# Patient Record
Sex: Male | Born: 1935 | Race: White | Hispanic: No | Marital: Married | Smoking: Former smoker
Health system: Southern US, Community
[De-identification: ages and names within clinical notes are randomized; demographics above are authoritative.]

## PROBLEM LIST (undated history)

## (undated) DIAGNOSIS — M1990 Unspecified osteoarthritis, unspecified site: Secondary | ICD-10-CM

## (undated) DIAGNOSIS — E0590 Thyrotoxicosis, unspecified without thyrotoxic crisis or storm: Secondary | ICD-10-CM

## (undated) DIAGNOSIS — G629 Polyneuropathy, unspecified: Secondary | ICD-10-CM

## (undated) HISTORY — DX: Thyrotoxicosis, unspecified without thyrotoxic crisis or storm: E05.90

## (undated) HISTORY — DX: Polyneuropathy, unspecified: G62.9

## (undated) HISTORY — PX: HEMORRHOID SURGERY: SHX153

## (undated) HISTORY — DX: Hypercalcemia: E83.52

## (undated) HISTORY — DX: Unspecified osteoarthritis, unspecified site: M19.90

---

## 2010-03-16 ENCOUNTER — Other Ambulatory Visit (HOSPITAL_COMMUNITY): Payer: Self-pay | Admitting: Endocrinology

## 2010-03-16 DIAGNOSIS — E0590 Thyrotoxicosis, unspecified without thyrotoxic crisis or storm: Secondary | ICD-10-CM

## 2010-03-29 ENCOUNTER — Ambulatory Visit (HOSPITAL_COMMUNITY): Payer: Self-pay

## 2010-03-29 ENCOUNTER — Encounter (HOSPITAL_COMMUNITY)
Admission: RE | Admit: 2010-03-29 | Discharge: 2010-03-29 | Disposition: A | Discharge status: Home / Self Care | Payer: Medicare Other | Source: Ambulatory Visit | Attending: Endocrinology | Admitting: Endocrinology

## 2010-03-29 DIAGNOSIS — E213 Hyperparathyroidism, unspecified: Secondary | ICD-10-CM | POA: Insufficient documentation

## 2010-03-29 DIAGNOSIS — E0590 Thyrotoxicosis, unspecified without thyrotoxic crisis or storm: Secondary | ICD-10-CM

## 2010-03-29 DIAGNOSIS — R946 Abnormal results of thyroid function studies: Secondary | ICD-10-CM | POA: Insufficient documentation

## 2010-03-29 MED ORDER — TECHNETIUM TC 99M SESTAMIBI - CARDIOLITE
25.0000 | Freq: Once | INTRAVENOUS | Status: AC | PRN
  Administered 2010-03-29: 25 via INTRAVENOUS

## 2010-07-14 ENCOUNTER — Encounter (HOSPITAL_COMMUNITY): Payer: Medicare Other

## 2010-07-26 ENCOUNTER — Encounter (INDEPENDENT_AMBULATORY_CARE_PROVIDER_SITE_OTHER): Payer: Medicare Other | Admitting: Surgery

## 2010-08-16 ENCOUNTER — Encounter (INDEPENDENT_AMBULATORY_CARE_PROVIDER_SITE_OTHER): Payer: Self-pay | Admitting: Surgery

## 2010-08-17 ENCOUNTER — Encounter (INDEPENDENT_AMBULATORY_CARE_PROVIDER_SITE_OTHER): Payer: Self-pay | Admitting: Surgery

## 2010-08-17 ENCOUNTER — Ambulatory Visit (INDEPENDENT_AMBULATORY_CARE_PROVIDER_SITE_OTHER): Payer: Medicare Other | Admitting: Surgery

## 2010-08-17 VITALS — BP 118/72 | HR 64 | Temp 96.6°F | Ht 71.0 in | Wt 188.6 lb

## 2010-08-17 DIAGNOSIS — E210 Primary hyperparathyroidism: Secondary | ICD-10-CM | POA: Insufficient documentation

## 2010-08-17 NOTE — Progress Notes (Signed)
Chief Complaint  Patient presents with  . Parathyroid Adenoma    recurrent hypercalcemia, previous parathyroidectomy    HISTORY: Patient is a 77 year old white male who presents with recurrent primary hyperparathyroidism. Patient apparently underwent parathyroidectomy at [facility] in [place] approximately 6 years ago. Following the procedure, he had complications including loss of voice for approximately 3 months. Calcium levels never returned to normal. He has continued to experience hypercalcemia. Patient was evaluated by his primary physician here in [place], and referred to an endocrinologist. Calcium levels are elevated at 11.2. PTH level is normal at 44. Patient now presents for consideration for neck exploration. Patient did undergo a nuclear medicine parathyroid scan in March 2012. This shows activity in the right thyroid bed consistent with parathyroid adenoma.  Patient denies any complications of hypercalcemia. He denies nephrolithiasis. He denies fatigue.   Past Medical History  Diagnosis Date  . Peripheral neuropathy   . Hypercalcemia   . Hyperthyroidism   . Hemorrhoids      Current outpatient prescriptions:aspirin 81 MG tablet, Take 81 mg by mouth daily. Patient also taking a medication for cholesterol, but does not remember name or dosage. Will provide at next visit. , Disp: , Rfl: ;  gabapentin (NEURONTIN) 300 MG capsule, Take 300 mg by mouth 3 (three) times daily.  , Disp: , Rfl:    No Known Allergies   History reviewed. No pertinent family history.   History  Substance Use Topics  . Smoking status: Former Games developer  . Smokeless tobacco: Not on file  . Alcohol Use: No     PERTINENT POSITIVES FROM REVIEW OF SYSTEMS: As noted, patient denies fatigue. He denies nephrolithiasis. He denies any recent fractures.   EXAM: Filed Vitals:   08/17/10 1004  BP: 118/72  Pulse: 64  Temp: 96.6 F (35.9 C)   HEENT shows him to be normocephalic. Sclerae  clear. Pupils reactive. Condition fair. Mucous membranes moist. Voice is normal with a slight ramps. He does experience some minor voice fatigue. Neck shows well-healed surgical wound to the right of midline measuring approximately 2 cm in length. Palpation reveals no mass. There is no tenderness. There are no thyroid nodules. There are no supraclavicular masses. Chest is clear to auscultation without rales rhonchi or wheeze Cardiac exam shows regular rate and rhythm without significant murmur Extremities are nontender without edema. Neurologically the patient is alert and oriented. Neuro no focal deficits. There is no sign of tremor.   LABORATORY RESULTS: See E-Chart for most recent results   RADIOLOGY RESULTS: See E-Chart or I-Site for most recent results   IMPRESSION: Recurrent persistent primary hyperparathyroidism. Personal history of previous parathyroidectomy.   PLAN: The patient and I discussed his history and his prior surgical procedure. We discussed options for management. These options both surgical and nonsurgical.  Patient would like to consider surgical intervention in hopes of doing this problem. I am going to ask him to undergo a 24-hour urine collection for calcium. We will also obtain a ultrasound of the neck to evaluate his thyroid and possibly identify an enlarged parathyroid adenoma on the right.  Patient will return to discuss the results of these studies and make a final decision regarding surgery.   Velora Heckler, MD, FACS General & Endocrine Surgery [facility]

## 2010-08-18 ENCOUNTER — Other Ambulatory Visit (INDEPENDENT_AMBULATORY_CARE_PROVIDER_SITE_OTHER): Payer: Self-pay | Admitting: Surgery

## 2010-08-18 ENCOUNTER — Ambulatory Visit
Admission: RE | Admit: 2010-08-18 | Discharge: 2010-08-18 | Disposition: A | Discharge status: Home / Self Care | Payer: Medicare Other | Source: Ambulatory Visit | Attending: Surgery | Admitting: Surgery

## 2010-08-19 LAB — CALCIUM, URINE, 24 HOUR
Calcium, 24 hour urine: 453 mg/d — ABNORMAL HIGH (ref 100–250)
Calcium, Ur: 37 mg/dL

## 2010-08-31 ENCOUNTER — Telehealth (INDEPENDENT_AMBULATORY_CARE_PROVIDER_SITE_OTHER): Payer: Self-pay | Admitting: Surgery

## 2010-09-02 ENCOUNTER — Telehealth (INDEPENDENT_AMBULATORY_CARE_PROVIDER_SITE_OTHER): Payer: Self-pay

## 2010-09-02 NOTE — Telephone Encounter (Signed)
Left message for patient to call back and get first available follow up appointment to discuss thyroid results. He may call back and schedule the appointment with any staff member for a follow.

## 2010-09-05 ENCOUNTER — Telehealth (INDEPENDENT_AMBULATORY_CARE_PROVIDER_SITE_OTHER): Payer: Self-pay

## 2010-09-05 NOTE — Telephone Encounter (Signed)
done

## 2010-09-14 ENCOUNTER — Telehealth (INDEPENDENT_AMBULATORY_CARE_PROVIDER_SITE_OTHER): Payer: Self-pay | Admitting: Surgery

## 2010-09-14 DIAGNOSIS — E210 Primary hyperparathyroidism: Secondary | ICD-10-CM

## 2010-09-14 NOTE — Telephone Encounter (Signed)
Called results of 24h U Ca and ultrasound to patient.  Will schedule for minimally invasive parathyroidectomy.  Patient prefers out-pt.

## 2010-09-22 ENCOUNTER — Ambulatory Visit (INDEPENDENT_AMBULATORY_CARE_PROVIDER_SITE_OTHER): Payer: Medicare Other | Admitting: Surgery

## 2010-10-10 ENCOUNTER — Other Ambulatory Visit (INDEPENDENT_AMBULATORY_CARE_PROVIDER_SITE_OTHER): Payer: Self-pay | Admitting: Surgery

## 2010-10-10 ENCOUNTER — Other Ambulatory Visit (HOSPITAL_COMMUNITY): Payer: Medicare Other

## 2010-10-10 ENCOUNTER — Encounter (HOSPITAL_COMMUNITY): Payer: Medicare Other

## 2010-10-10 ENCOUNTER — Ambulatory Visit (HOSPITAL_COMMUNITY)
Admission: RE | Admit: 2010-10-10 | Discharge: 2010-10-10 | Disposition: A | Discharge status: Home / Self Care | Payer: Medicare Other | Source: Ambulatory Visit | Attending: Surgery | Admitting: Surgery

## 2010-10-10 DIAGNOSIS — Z01810 Encounter for preprocedural cardiovascular examination: Secondary | ICD-10-CM | POA: Insufficient documentation

## 2010-10-10 DIAGNOSIS — Z01818 Encounter for other preprocedural examination: Secondary | ICD-10-CM | POA: Insufficient documentation

## 2010-10-10 DIAGNOSIS — Z01812 Encounter for preprocedural laboratory examination: Secondary | ICD-10-CM | POA: Insufficient documentation

## 2010-10-10 LAB — URINALYSIS, ROUTINE W REFLEX MICROSCOPIC
Leukocytes, UA: NEGATIVE
Nitrite: NEGATIVE
Specific Gravity, Urine: 1.021 (ref 1.005–1.030)
Urobilinogen, UA: 0.2 mg/dL (ref 0.0–1.0)
pH: 6.5 (ref 5.0–8.0)

## 2010-10-10 LAB — DIFFERENTIAL
Basophils Absolute: 0 10*3/uL (ref 0.0–0.1)
Basophils Relative: 1 % (ref 0–1)
Lymphocytes Relative: 28 % (ref 12–46)
Monocytes Absolute: 0.6 10*3/uL (ref 0.1–1.0)
Neutro Abs: 3.7 10*3/uL (ref 1.7–7.7)
Neutrophils Relative %: 59 % (ref 43–77)

## 2010-10-10 LAB — BASIC METABOLIC PANEL
CO2: 29 mEq/L (ref 19–32)
Calcium: 11.7 mg/dL — ABNORMAL HIGH (ref 8.4–10.5)
Creatinine, Ser: 0.79 mg/dL (ref 0.50–1.35)
GFR calc non Af Amer: >60 mL/min (ref >60)
Sodium: 139 mEq/L (ref 135–145)

## 2010-10-10 LAB — CBC
HCT: 46.8 % (ref 39.0–52.0)
MCHC: 34 g/dL (ref 30.0–36.0)
Platelets: 320 10*3/uL (ref 150–400)
RDW: 12.3 % (ref 11.5–15.5)
WBC: 6.3 10*3/uL (ref 4.0–10.5)

## 2010-10-10 LAB — PROTIME-INR: INR: 0.97 (ref 0.00–1.49)

## 2010-10-10 LAB — SURGICAL PCR SCREEN
MRSA, PCR: NEGATIVE
Staphylococcus aureus: NEGATIVE

## 2010-10-10 NOTE — Progress Notes (Signed)
Quick Note:  These results are acceptable for scheduled surgery. TMG ______ 

## 2010-10-13 ENCOUNTER — Observation Stay (HOSPITAL_COMMUNITY)
Admission: AD | Admit: 2010-10-13 | Discharge: 2010-10-14 | Disposition: A | Discharge status: Home / Self Care | Payer: Medicare Other | Source: Ambulatory Visit | Attending: Surgery | Admitting: Surgery

## 2010-10-13 ENCOUNTER — Other Ambulatory Visit (INDEPENDENT_AMBULATORY_CARE_PROVIDER_SITE_OTHER): Payer: Self-pay | Admitting: Surgery

## 2010-10-13 DIAGNOSIS — E063 Autoimmune thyroiditis: Secondary | ICD-10-CM

## 2010-10-13 DIAGNOSIS — F17200 Nicotine dependence, unspecified, uncomplicated: Secondary | ICD-10-CM | POA: Insufficient documentation

## 2010-10-13 DIAGNOSIS — J45909 Unspecified asthma, uncomplicated: Secondary | ICD-10-CM | POA: Insufficient documentation

## 2010-10-13 DIAGNOSIS — K219 Gastro-esophageal reflux disease without esophagitis: Secondary | ICD-10-CM | POA: Insufficient documentation

## 2010-10-13 DIAGNOSIS — Z01812 Encounter for preprocedural laboratory examination: Secondary | ICD-10-CM | POA: Insufficient documentation

## 2010-10-13 DIAGNOSIS — E210 Primary hyperparathyroidism: Secondary | ICD-10-CM

## 2010-10-13 DIAGNOSIS — D351 Benign neoplasm of parathyroid gland: Secondary | ICD-10-CM | POA: Insufficient documentation

## 2010-10-13 HISTORY — PX: PARATHYROIDECTOMY: SHX19

## 2010-10-18 NOTE — Op Note (Signed)
[place]  Steve Thompson, Steve Thompson NO.:  [id number]  MEDICAL RECORD NO.:  [id number]  LOCATION:  1534                         FACILITY:  [facility]  PHYSICIAN:  Velora Heckler, MD      DATE OF BIRTH:  05/07/1931  DATE OF PROCEDURE:  10/13/2010                               OPERATIVE REPORT   PREOPERATIVE DIAGNOSIS:  Recurrent primary hyperparathyroidism.  POSTOPERATIVE DIAGNOSIS:  Recurrent primary hyperparathyroidism.  PROCEDURE: 1. Right superior parathyroidectomy. 2. Right thyroid lobectomy.  SURGEON:  Velora Heckler, MD, FACS  ANESTHESIA:  General per Dr. Lucille Passy.  ESTIMATED BLOOD LOSS:  Minimal.  PREPARATION:  ChloraPrep.  COMPLICATIONS:  None.  INDICATIONS:  The patient is a 77 year old white male who presents with recurrent primary hyperparathyroidism.  The patient had undergone parathyroidectomy at [facility] in [place] 6 years ago. Postoperatively, he experienced significant hoarseness and change in voice quality, which persisted for approximately 3 months.  Serum calcium levels never normalized and intact PTH levels apparently remained abnormal.  The patient was referred to my practice by his endocrinologist.  Nuclear medicine, parathyroid scanning showed a focus of activity posterior to the right thyroid lobe.  Ultrasound demonstrated a 9 mm nodular density posterior to the right thyroid lobe. The patient's previous exploration had also been on the right side of the neck.  The patient now comes to surgery for neck exploration and parathyroidectomy.  BODY OF REPORT:  Procedure was done in OR#1 at the [facility].  The patient was brought to the operating room, placed in supine position on the operating room table.  Following administration of general anesthesia, the patient was positioned and then prepped and draped in the usual strict aseptic fashion.  After ascertaining that an adequate level of anesthesia had  been achieved, a right inferior neck incision was made just cephalad to his previous incision.  The dissection was carried through subcutaneous tissues and platysma and hemostasis obtained with electrocautery.  Skin flaps were elevated circumferentially.  Previous scar tissue was divided with electrocautery.  Weitlaner retractor was placed for exposure.  Strap muscles were incised in the midline and reflected to the right exposing the right thyroid lobe.  There were dense adhesions to the capsule of the thyroid due to previous surgery.  Previous ligature clips were visible.  Lobe was mobilized with some difficulty.  In order to fully visualize the tracheoesophageal groove and explore the right neck, it was decided that the right thyroid lobe should be excised.  Therefore the superior pole vessels were divided between Ligaclips.  The gland was rolled anteriorly.  There appeared to be a prominent tubercle of Zuckerkandl, which was mobilized.  Recurrent laryngeal nerves were identified on the right.  There appeared to be 2 main trunks of the nerve.  Dissection around these revealed a mass beneath the nerves.  The two main trunks of the nerve were separated with gentle blunt dissection.  Upon opening the space behind the nerves, there was an abnormally enlarged nodule of tissue which appeared to be parathyroid. This was gently dissected out.  It measured approximately 1 cm in greatest diameter.  It was excised in its entirety and  the vascular pedicle divided between small Ligaclips with scissors.  It was submitted fresh to pathology where frozen section by Dr. Hollice Espy confirmed parathyroid tissue, which was hyperplastic and consistent with parathyroid adenoma.  Remainder of the right lobe was excised by dividing inferior venous tributaries between medium Ligaclips and excising the gland off of the lateral portion of the trachea.  It was mobilized to the isthmus, which was then divided  with the electrocautery.  The right thyroid lobe was sectioned on the operating room table, but no further parathyroid tissue was identified.  The right thyroid lobe was submitted to pathology for review.  Further dissection along the tracheoesophageal groove revealed no evidence of enlarged parathyroid tissue.  The carotid sheath was opened and no evidence of ectopic parathyroid tissue was identified.  The neck was irrigated with warm saline.  Surgicel was placed in the operative field.  Strap muscles were reapproximated in the midline with interrupted 3-0 Vicryl sutures.  Platysma was closed with interrupted 3- 0 Vicryl sutures.  Skin was anesthetized with local anesthetic.  Skin incision was closed with a running 4-0 Monocryl subcuticular suture. The wound was washed and dried and Benzoin and Steri-Strips were applied.  Sterile dressings were applied.  The patient was awakened from anesthesia and brought to the recovery room.  The patient tolerated the procedure well.   Velora Heckler, MD, FACS     TMG/MEDQ  D:  10/13/2010  T:  10/13/2010  Job:  [id number]  cc:   Dorisann Frames, M.D. Fax: [phone]  Knox Royalty, MD Fax: [phone]  Electronically Signed by Darnell Level MD on 10/18/2010 10:44:21 AM

## 2010-10-18 NOTE — Discharge Summary (Signed)
  NAMEM[place] Kitchen  Steve Thompson, Steve Thompson NO.:  [id number]  MEDICAL RECORD NO.:  [id number]  LOCATION:  1534                         FACILITY:  [facility]  PHYSICIAN:  Velora Heckler, MD      DATE OF BIRTH:  16-Apr-1931  DATE OF ADMISSION:  10/13/2010 DATE OF DISCHARGE:  10/14/2010                              DISCHARGE SUMMARY   REASON FOR ADMISSION:  Recurrent primary hyperparathyroidism.  BRIEF HISTORY:  The patient is a 77 year old white male with longstanding hypercalcemia.  The patient underwent neck exploration in [place], [place] 6 years ago.  Postoperatively, his calcium levels failed to normalize.  The patient is relocated to [facility]. He is referred for persistent primary hyperparathyroidism.  The patient now comes to surgery for parathyroidectomy.  HOSPITAL COURSE:  The patient was admitted on the day of surgery, October 13, 2010.  He was taken directly to the operating room where he underwent right neck exploration.  The patient underwent right thyroid lobectomy.  Parathyroid adenoma was identified and resected.  Postoperatively, the patient remained stable.  Serum calcium levels fell to 9.7 on the evening of surgery and 9.3 on the morning following surgery.  The patient had minimal pain.  He tolerated a regular diet. He was prepared for discharge home on the first postoperative day.  DISCHARGE PLANNING:  The patient is discharged home on October 14, 2010, in good condition, tolerating a regular diet, ambulating independently.  The patient will return to my office in 2-3 weeks with a serum calcium level.  The patient will require monitoring of his TSH level in approximately 4-6 weeks.  Usual wound care instructions were given.  Vicodin prescription is given for pain as needed.  FINAL DIAGNOSIS:  Primary hyperparathyroidism, recurrent.  CONDITION AT DISCHARGE:  Good.     Velora Heckler, MD     TMG/MEDQ  D:  10/14/2010  T:  10/14/2010  Job:   [id number]  Electronically Signed by Darnell Level MD on 10/18/2010 10:44:26 AM

## 2010-10-19 ENCOUNTER — Telehealth (INDEPENDENT_AMBULATORY_CARE_PROVIDER_SITE_OTHER): Payer: Self-pay

## 2010-10-19 ENCOUNTER — Other Ambulatory Visit (INDEPENDENT_AMBULATORY_CARE_PROVIDER_SITE_OTHER): Payer: Self-pay

## 2010-10-19 DIAGNOSIS — G8918 Other acute postprocedural pain: Secondary | ICD-10-CM

## 2010-10-19 NOTE — Telephone Encounter (Signed)
Patient called and said he was having tingling in feet and cramps in his hands. Dr Dwain Sarna suggests he go get stat calcium checked and have results called into Dr Johna Sheriff tonight. Patient agreed.

## 2010-10-20 ENCOUNTER — Telehealth (INDEPENDENT_AMBULATORY_CARE_PROVIDER_SITE_OTHER): Payer: Self-pay

## 2010-10-20 NOTE — Telephone Encounter (Signed)
Serum calcium level 9.7 reported to patient.  Patient will have scheduled calcium level checked before post op appointment on 11/09/2010. RMP

## 2010-11-04 ENCOUNTER — Other Ambulatory Visit (INDEPENDENT_AMBULATORY_CARE_PROVIDER_SITE_OTHER): Payer: Self-pay | Admitting: Surgery

## 2010-11-04 LAB — CALCIUM: Calcium: 9.9 mg/dL (ref 8.4–10.5)

## 2010-11-09 ENCOUNTER — Ambulatory Visit (INDEPENDENT_AMBULATORY_CARE_PROVIDER_SITE_OTHER): Payer: Medicare Other | Admitting: Surgery

## 2010-11-09 ENCOUNTER — Encounter (INDEPENDENT_AMBULATORY_CARE_PROVIDER_SITE_OTHER): Payer: Self-pay | Admitting: Surgery

## 2010-11-09 DIAGNOSIS — E063 Autoimmune thyroiditis: Secondary | ICD-10-CM | POA: Insufficient documentation

## 2010-11-09 DIAGNOSIS — E210 Primary hyperparathyroidism: Secondary | ICD-10-CM

## 2010-11-09 NOTE — Patient Instructions (Signed)
  COCOA BUTTER & VITAMIN E CREAM  ([employer] or other brand)  Apply cocoa butter/vitamin E cream to your incision 2 - 3 times daily.  Massage cream into incision for one minute with each application.  Use sunscreen (50 SPF or higher) for first 6 months after surgery.  You may substitute Mederma or other scar reducing creams as desired.   

## 2010-11-09 NOTE — Progress Notes (Signed)
Visit Diagnoses: 1. Hyperparathyroidism, primary - recurrent   2. Thyroiditis, lymphocytic     HISTORY: Patient returns for her first postoperative visit having undergone right thyroid lobectomy and parathyroidectomy. Followup serum calcium level has normalized at 9.9. Patient notes significant hoarseness following his procedure. This happened after his previous surgery. He does note that his voice has been becoming stronger over the past week or two.   EXAM: Surgical wound is healing nicely. Minimal soft tissue swelling. No sign of infection. Moderate hoarseness.   IMPRESSION: #1 status post minimally invasive parathyroidectomy with normalization of serum calcium level at 9.9 #2 status post right thyroid lobectomy with pathology showing lymphocytic thyroiditis   PLAN: The patient will have laboratory studies drawn in 4 weeks. We will check a TSH level, an intact PTH level, and a serum calcium level. Patient will return to see me for wound check and to review these results in 6 weeks.   Velora Heckler, MD, FACS General & Endocrine Surgery [facility]

## 2010-12-27 ENCOUNTER — Other Ambulatory Visit (INDEPENDENT_AMBULATORY_CARE_PROVIDER_SITE_OTHER): Payer: Self-pay | Admitting: Surgery

## 2010-12-28 ENCOUNTER — Ambulatory Visit (INDEPENDENT_AMBULATORY_CARE_PROVIDER_SITE_OTHER): Payer: Medicare Other | Admitting: Surgery

## 2010-12-28 ENCOUNTER — Encounter (INDEPENDENT_AMBULATORY_CARE_PROVIDER_SITE_OTHER): Payer: Self-pay | Admitting: Surgery

## 2010-12-28 DIAGNOSIS — E890 Postprocedural hypothyroidism: Secondary | ICD-10-CM | POA: Insufficient documentation

## 2010-12-28 DIAGNOSIS — E063 Autoimmune thyroiditis: Secondary | ICD-10-CM

## 2010-12-28 DIAGNOSIS — E210 Primary hyperparathyroidism: Secondary | ICD-10-CM

## 2010-12-28 LAB — PTH, INTACT AND CALCIUM: PTH: 36 pg/mL (ref 14.0–72.0)

## 2010-12-28 MED ORDER — SYNTHROID 88 MCG PO TABS: 88.0000 ug | ORAL_TABLET | Freq: Every day | ORAL | Status: DC

## 2010-12-28 NOTE — Patient Instructions (Signed)
Begin taking new thyroid medication as directed.  Lab work for TSH level (thyroid) in 5 weeks.  tmg

## 2010-12-28 NOTE — Progress Notes (Signed)
Visit Diagnoses: 1. Hyperparathyroidism, primary - recurrent   2. Thyroiditis, lymphocytic   3. Hypothyroidism, postsurgical     HISTORY: Patient returns for his second postoperative visit having undergone parathyroidectomy and thyroid lobectomy. Patient continues to do quite well. He has no specific complaints. TSH level drawn on December 23, 2010 is markedly elevated at 22.823.  Patient is not currently taking thyroid hormone replacement. A calcium level and intact PTH level are pending from the laboratory at this time.   PERTINENT REVIEW OF SYSTEMS: Patient denies fatigue. He denies sleep disorder. He denies tremor. He denies palpitations. He denies weight changes.  EXAM: HEENT: normocephalic; pupils equal and reactive; sclerae clear; dentition good; mucous membranes moist NECK:  Wound well-healed; symmetric on extension; no palpable anterior or posterior cervical lymphadenopathy; no supraclavicular masses; no tenderness CHEST: clear to auscultation bilaterally without rales, rhonchi, or wheezes CARDIAC: regular rate and rhythm without significant murmur; peripheral pulses are full EXT:  non-tender without edema; no deformity NEURO: no gross focal deficits; no sign of tremor   IMPRESSION: #1 status post parathyroidectomy with normalization of serum calcium level #2 status post thyroid lobectomy #3 post surgical hypothyroidism   PLAN: Patient was started on Synthroid 88 mcg daily. We will check a TSH level in 5 weeks. He will return to see me for followup in 6 weeks. In the interim we will obtain the results of his calcium and PTH levels for review.   Velora Heckler, MD, FACS General & Endocrine Surgery [facility]

## 2011-02-10 ENCOUNTER — Encounter (INDEPENDENT_AMBULATORY_CARE_PROVIDER_SITE_OTHER): Payer: Medicare Other | Admitting: Surgery

## 2011-03-03 ENCOUNTER — Other Ambulatory Visit (INDEPENDENT_AMBULATORY_CARE_PROVIDER_SITE_OTHER): Payer: Self-pay | Admitting: Surgery

## 2011-03-04 LAB — TSH: TSH: 0.419 u[IU]/mL (ref 0.350–4.500)

## 2011-03-07 ENCOUNTER — Encounter (INDEPENDENT_AMBULATORY_CARE_PROVIDER_SITE_OTHER): Payer: Self-pay | Admitting: Surgery

## 2011-03-07 ENCOUNTER — Ambulatory Visit (INDEPENDENT_AMBULATORY_CARE_PROVIDER_SITE_OTHER): Payer: Medicare Other | Admitting: Surgery

## 2011-03-07 DIAGNOSIS — E210 Primary hyperparathyroidism: Secondary | ICD-10-CM

## 2011-03-07 DIAGNOSIS — E890 Postprocedural hypothyroidism: Secondary | ICD-10-CM

## 2011-03-07 DIAGNOSIS — E063 Autoimmune thyroiditis: Secondary | ICD-10-CM

## 2011-03-07 NOTE — Progress Notes (Signed)
Visit Diagnoses: 1. Hyperparathyroidism, primary - recurrent   2. Thyroiditis, lymphocytic   3. Hypothyroidism, postsurgical     HISTORY: The patient is a 78 year old white male who returns for followup having undergone thyroid lobectomy and parathyroidectomy in the fall of 2012. He is currently taking Synthroid 88 mcg daily. TSH level in December 2012 was low normal at 0.419. PTH level is normal at 36.0. Postsurgical calcium level is normal at 9.5. Patient returns today for review of laboratory studies and final wound check.  PERTINENT REVIEW OF SYSTEMS: Patient has no specific complaints. Voice quality has returned to normal. No problems with the surgical wounds.  EXAM: HEENT: normocephalic; pupils equal and reactive; sclerae clear; dentition good; mucous membranes moist NECK:  Surgical wound is well healed; no palpable abnormality; symmetric on extension; no palpable anterior or posterior cervical lymphadenopathy; no supraclavicular masses; no tenderness CHEST: clear to auscultation bilaterally without rales, rhonchi, or wheezes CARDIAC: regular rate and rhythm without significant murmur; peripheral pulses are full EXT:  non-tender without edema; no deformity NEURO: no gross focal deficits; no sign of tremor   IMPRESSION: #1 status post parathyroidectomy with normalization of PTH level and serum calcium level #2 status post thyroid lobectomy for lymphocytic thyroiditis #3 post surgical hypothyroidism  PLAN: Patient is moving his primary care to Dr. Miguel Aschoff at [place] at [facility]. I will ask his primary physician to review his TSH level and make a decision regarding his thyroid hormone replacement. I suspect that he may need a slight dosage decrease to 75 mcg daily.  He will return to see me as needed.  Velora Heckler, MD, FACS General & Endocrine Surgery [facility]

## 2011-03-07 NOTE — Patient Instructions (Signed)
Consider decrease in Synthroid dosage to 75 mcg daily.  Ask primary physician.  Steve Thompson

## 2012-05-24 ENCOUNTER — Other Ambulatory Visit: Payer: Self-pay | Admitting: Surgery

## 2013-09-09 ENCOUNTER — Other Ambulatory Visit: Payer: Self-pay | Admitting: Family Medicine

## 2013-09-09 DIAGNOSIS — R221 Localized swelling, mass and lump, neck: Secondary | ICD-10-CM

## 2013-09-12 ENCOUNTER — Ambulatory Visit
Admission: RE | Admit: 2013-09-12 | Discharge: 2013-09-12 | Disposition: A | Discharge status: Home / Self Care | Payer: Medicare Other | Source: Ambulatory Visit | Attending: Family Medicine | Admitting: Family Medicine

## 2013-09-12 DIAGNOSIS — R221 Localized swelling, mass and lump, neck: Secondary | ICD-10-CM

## 2015-02-09 DIAGNOSIS — H04563 Stenosis of bilateral lacrimal punctum: Secondary | ICD-10-CM | POA: Diagnosis not present

## 2015-02-09 DIAGNOSIS — D3131 Benign neoplasm of right choroid: Secondary | ICD-10-CM | POA: Diagnosis not present

## 2015-02-09 DIAGNOSIS — H2513 Age-related nuclear cataract, bilateral: Secondary | ICD-10-CM | POA: Diagnosis not present

## 2015-04-13 DIAGNOSIS — R319 Hematuria, unspecified: Secondary | ICD-10-CM | POA: Diagnosis not present

## 2015-04-22 DIAGNOSIS — R350 Frequency of micturition: Secondary | ICD-10-CM | POA: Diagnosis not present

## 2015-04-28 DIAGNOSIS — S0181XA Laceration without foreign body of other part of head, initial encounter: Secondary | ICD-10-CM | POA: Diagnosis not present

## 2015-04-28 DIAGNOSIS — T148 Other injury of unspecified body region: Secondary | ICD-10-CM | POA: Diagnosis not present

## 2015-04-28 DIAGNOSIS — S0990XA Unspecified injury of head, initial encounter: Secondary | ICD-10-CM | POA: Diagnosis not present

## 2015-04-28 DIAGNOSIS — R55 Syncope and collapse: Secondary | ICD-10-CM | POA: Diagnosis not present

## 2015-05-03 ENCOUNTER — Ambulatory Visit (INDEPENDENT_AMBULATORY_CARE_PROVIDER_SITE_OTHER): Payer: PPO | Admitting: Internal Medicine

## 2015-05-03 ENCOUNTER — Other Ambulatory Visit (HOSPITAL_COMMUNITY): Payer: PPO

## 2015-05-03 ENCOUNTER — Encounter: Payer: Self-pay | Admitting: Internal Medicine

## 2015-05-03 VITALS — BP 140/64 | HR 82 | Ht 71.0 in | Wt 188.8 lb

## 2015-05-03 DIAGNOSIS — R55 Syncope and collapse: Secondary | ICD-10-CM

## 2015-05-03 NOTE — Progress Notes (Signed)
Cardiology Office Note   Date:  05/04/2015   ID:  [name], DOB Jun 04, 1931, MRN [id number]  PCP:  Andria Frames, MD  Cardiologist:   Dorris Carnes, MD   Pt is referred for evaluation of syncope    History of Present Illness: Steve Thompson is a 82 y.o. male with a history of Syncope  Occurred on 4/12 at about 3 PM   Pt at airport to cancel trip  Standing at [place] headed then passed  out .   EMS arrived  BP 78/ Felt OK earlier in day  Got up  [place] for work  Then home  Went to [employer] a   Lunch  Then went to take wife to doctor then airport.    Since the spell head hurts some  No dizzienss    Stays active some days    No CP  Breathing is OK  No palpitations    Had kidney infectin 3 wks ago  Rx cipro  Took last one yesterday  Still taking flomax   Had heart cath in [place]  [facility])  Admitted for CP  Approx 2008  Everything went OK   30% in one vessel     Outpatient Prescriptions Prior to Visit  Medication Sig Dispense Refill  . aspirin 81 MG tablet Take 81 mg by mouth daily. Patient also taking a medication for cholesterol, but does not remember name or dosage. Will provide at next visit.     [place] gabapentin (NEURONTIN) 300 MG capsule Take 300 mg by mouth 3 (three) times daily.      . simvastatin (ZOCOR) 40 MG tablet     . SYNTHROID 88 MCG tablet Take 1 tablet (88 mcg total) by mouth daily. (Patient taking differently: Take 100 mcg by mouth daily. ) 30 tablet 6   No facility-administered medications prior to visit.     Allergies:   Review of patient's allergies indicates no known allergies.   Past Medical History  Diagnosis Date  . Peripheral neuropathy ([place])   . Hypercalcemia   . Hyperthyroidism   . Hemorrhoids   . Arthritis     Past Surgical History  Procedure Laterality Date  . Parathyroidectomy  10/13/10  . Hemorrhoid surgery       Social History:  The patient  reports that he has quit smoking. He has never used smokeless  tobacco. He reports that he does not drink alcohol or use illicit drugs.   Family History:  The patient's family history includes Cancer in his sister.    ROS:  Please see the history of present illness. All other systems are reviewed and  Negative to the above problem except as noted.    PHYSICAL EXAM: VS:  BP 140/64 mmHg  Pulse 82  Ht 5\' 11"  (1.803 m)  Wt 188 lb 12.8 oz (85.639 kg)  BMI 26.34 kg/m2  SpO2 95%  GEN: Well nourished, well developed, in no acute distress HEENT: normal Neck: no JVD, carotid bruits, or masses Cardiac: RRR; no murmurs, rubs, or gallops,no edema  Respiratory:  clear to auscultation bilaterally, normal work of breathing GI: soft, nontender, nondistended, + BS  No hepatomegaly  MS: no deformity Moving all extremities   Skin: warm and dry, no rash Neuro:  Strength and sensation are intact Psych: euthymic mood, full affect   EKG:  EKG is ordered today.  SR 68 bpm  PR interval 254 msec  Incomp RBBB  Lipid Panel  No results found for: CHOL, TRIG, HDL, CHOLHDL, VLDL, LDLCALC, LDLDIRECT    Wt Readings from Last 3 Encounters:  05/03/15 188 lb 12.8 oz (85.639 kg)  03/07/11 190 lb 9.6 oz (86.456 kg)  12/28/10 193 lb 9.6 oz (87.816 kg)      ASSESSMENT AND PLAN:  1  Syncope  May be orthostatic  EKG with conduction delay Would set up for holter monitor and echo   Encoruaged pt to increase fluid intake No driving for 6 months from event unless other cause found     Signed, Dorris Carnes, MD  05/04/2015 7:03 PM    [address] [place], [place], [place]  [address] Phone: [phone]; Fax: [phone]

## 2015-05-03 NOTE — Patient Instructions (Signed)
Medication Instructions:  Your physician recommends that you continue on your current medications as directed. Please refer to the Current Medication list given to you today.   Labwork: None ordered  Testing/Procedures: Your physician has recommended that you wear a holter monitor. Holter monitors are medical devices that record the heart's electrical activity. Doctors most often use these monitors to diagnose arrhythmias. Arrhythmias are problems with the speed or rhythm of the heartbeat. The monitor is a small, portable device. You can wear one while you do your normal daily activities. This is usually used to diagnose what is causing palpitations/syncope (passing out).  Your physician has requested that you have an echocardiogram. Echocardiography is a painless test that uses sound waves to create images of your heart. It provides your doctor with information about the size and shape of your heart and how well your heart's chambers and valves are working. This procedure takes approximately one hour. There are no restrictions for this procedure.    Follow-Up: Your physician recommends that you schedule a follow-up appointment in: June 2017 with Dr.Ross   Any Other Special Instructions Will Be Listed Below (If Applicable).     If you need a refill on your cardiac medications before your next appointment, please call your pharmacy.

## 2015-05-05 ENCOUNTER — Other Ambulatory Visit: Payer: Self-pay

## 2015-05-05 ENCOUNTER — Ambulatory Visit (HOSPITAL_COMMUNITY): Payer: PPO | Attending: Internal Medicine

## 2015-05-05 ENCOUNTER — Ambulatory Visit (INDEPENDENT_AMBULATORY_CARE_PROVIDER_SITE_OTHER): Payer: PPO

## 2015-05-05 DIAGNOSIS — I351 Nonrheumatic aortic (valve) insufficiency: Secondary | ICD-10-CM | POA: Diagnosis not present

## 2015-05-05 DIAGNOSIS — Z87891 Personal history of nicotine dependence: Secondary | ICD-10-CM | POA: Diagnosis not present

## 2015-05-05 DIAGNOSIS — R55 Syncope and collapse: Secondary | ICD-10-CM | POA: Diagnosis not present

## 2015-05-05 DIAGNOSIS — I517 Cardiomegaly: Secondary | ICD-10-CM | POA: Insufficient documentation

## 2015-05-12 ENCOUNTER — Telehealth: Payer: Self-pay | Admitting: Internal Medicine

## 2015-05-12 NOTE — Telephone Encounter (Signed)
New message ° ° ° ° ° °Calling to get monitor results °

## 2015-05-12 NOTE — Telephone Encounter (Signed)
Left message stating the results are not available yet. Bit we will call once they are sent to Dr. Harrington Challenger and she reviews them.

## 2015-05-14 ENCOUNTER — Telehealth: Payer: Self-pay | Admitting: Internal Medicine

## 2015-05-14 NOTE — Telephone Encounter (Signed)
New Message:  Pt is calling back to speak with the nurse about the results to his monitor. Please f/u with him

## 2015-05-14 NOTE — Telephone Encounter (Signed)
Notes Recorded by Rodman Key, RN on 05/14/2015 at 5:43 PM Reviewed results with patient. Advised that Dr. Harrington Challenger would like him to come back in about 3 weeks with PA/NP to assess at that time. Pt wishes to drive. Advised no cause has been identified as of now and so it is not advisable to drive. He verbalizes understanding of this. He states he feels "great. I let myself get run down, not eating or drinking enough, that's what happened." He is aware we will call him back with f/u appointment to discuss further. Notes Recorded by Fay Records, MD on 05/14/2015 at 5:09 PM No arrhythmias noted Stay hydrated  Call in 4 wks with how feeling

## 2015-05-19 NOTE — Telephone Encounter (Signed)
Follow Up call .   Steve Thompson is calling to follow up on if you were able to get in to see Dr. Harrington Challenger at a earlier time , tofind out why he blacked out . He wants to get back to driving . Please cal

## 2015-05-20 NOTE — Telephone Encounter (Signed)
Rescheduled Mr. Studer for 05/27/15 with Dr. Harrington Challenger.

## 2015-05-25 ENCOUNTER — Ambulatory Visit (INDEPENDENT_AMBULATORY_CARE_PROVIDER_SITE_OTHER): Payer: PPO | Admitting: Internal Medicine

## 2015-05-25 ENCOUNTER — Encounter: Payer: Self-pay | Admitting: Internal Medicine

## 2015-05-25 VITALS — BP 134/78 | HR 85 | Ht 71.0 in | Wt 191.6 lb

## 2015-05-25 DIAGNOSIS — R55 Syncope and collapse: Secondary | ICD-10-CM | POA: Diagnosis not present

## 2015-05-25 NOTE — Patient Instructions (Signed)
Your physician recommends that you continue on your current medications as directed. Please refer to the Current Medication list given to you today. Your physician recommends that you schedule a follow-up appointment in: as needed with Dr. Ross.   

## 2015-05-25 NOTE — Progress Notes (Signed)
Cardiology Office Note   Date:  05/25/2015   ID:  Steve Thompson, DOB [birth year], MRN [id number]  PCP:   Melinda Crutch, MD  Cardiologist:   Dorris Carnes, MD   F/U  For syncope      History of Present Illness: Steve Thompson is a 82 y.o. male with a history of synncope  I saw him for the first time on 4/17  Refer to this clinic note for full details  Since seen the pt denies dizzienss  Drinking more fluids   He recalls back to day of event  Did not drink much or have much intake all that day  Was standing at time  Recovering for cold and kidney infection  Breathing is good  No palpitations  Active      Outpatient Prescriptions Prior to Visit  Medication Sig Dispense Refill  . aspirin 81 MG tablet Take 81 mg by mouth daily. Patient also taking a medication for cholesterol, but does not remember name or dosage. Will provide at next visit.     Marland Kitchen gabapentin (NEURONTIN) 300 MG capsule Take 300 mg by mouth 3 (three) times daily.      . simvastatin (ZOCOR) 40 MG tablet     . SYNTHROID 88 MCG tablet Take 1 tablet (88 mcg total) by mouth daily. (Patient taking differently: Take 100 mcg by mouth daily. ) 30 tablet 6  . ciprofloxacin (CIPRO) 750 MG tablet Reported on 05/25/2015  0  . tamsulosin (FLOMAX) 0.4 MG CAPS capsule Reported on 05/25/2015  0   No facility-administered medications prior to visit.     Allergies:   Review of patient's allergies indicates no known allergies.   Past Medical History  Diagnosis Date  . Peripheral neuropathy ([place])   . Hypercalcemia   . Hyperthyroidism   . Hemorrhoids   . Arthritis     Past Surgical History  Procedure Laterality Date  . Parathyroidectomy  10/13/10  . Hemorrhoid surgery       Social History:  The patient  reports that he has quit smoking. He has never used smokeless tobacco. He reports that he does not drink alcohol or use illicit drugs.   Family History:  The patient's family history includes Cancer in his sister.    ROS:  Please  see the history of present illness. All other systems are reviewed and  Negative to the above problem except as noted.    PHYSICAL EXAM: VS:  BP 134/78 mmHg  Pulse 85  Ht 5\' 11"  (1.803 m)  Wt 191 lb 9.6 oz (86.909 kg)  BMI 26.73 kg/m2  SpO2 96%  GEN: Well nourished, well developed, in no acute distress HEENT: normal Neck: no JVD, carotid bruits, or masses Cardiac: RRR; no murmurs, rubs, or gallops,no edema  Respiratory:  clear to auscultation bilaterally, normal work of breathing GI: soft, nontender, nondistended, + BS  No hepatomegaly  MS: no deformity Moving all extremities   Skin: warm and dry, no rash Neuro:  Strength and sensation are intact Psych: euthymic mood, full affect   EKG:  EKG is not ordered today.   Lipid Panel No results found for: CHOL, TRIG, HDL, CHOLHDL, VLDL, LDLCALC, LDLDIRECT    Wt Readings from Last 3 Encounters:  05/25/15 191 lb 9.6 oz (86.909 kg)  05/03/15 188 lb 12.8 oz (85.639 kg)  03/07/11 190 lb 9.6 oz (86.456 kg)      ASSESSMENT AND PLAN:  1  Syncope  No recurrence  Hx suggests orthostatic  on day of event   Drking more  Work up neg   I think he is low risk for anther event  Hydrate  OK to drive   Call back if dizziness/syncope return        Signed, Dorris Carnes, MD  05/25/2015 [address] [place], [place], [place]  [address] Phone: [phone]; Fax: [phone]

## 2015-05-27 ENCOUNTER — Ambulatory Visit: Payer: PPO | Admitting: Internal Medicine

## 2015-06-21 ENCOUNTER — Ambulatory Visit: Payer: PPO | Admitting: Internal Medicine

## 2015-06-24 DIAGNOSIS — G609 Hereditary and idiopathic neuropathy, unspecified: Secondary | ICD-10-CM | POA: Diagnosis not present

## 2015-06-24 DIAGNOSIS — E890 Postprocedural hypothyroidism: Secondary | ICD-10-CM | POA: Diagnosis not present

## 2015-06-24 DIAGNOSIS — E7800 Pure hypercholesterolemia, unspecified: Secondary | ICD-10-CM | POA: Diagnosis not present

## 2015-06-24 DIAGNOSIS — E559 Vitamin D deficiency, unspecified: Secondary | ICD-10-CM | POA: Diagnosis not present

## 2015-06-24 DIAGNOSIS — Z0000 Encounter for general adult medical examination without abnormal findings: Secondary | ICD-10-CM | POA: Diagnosis not present

## 2015-06-24 DIAGNOSIS — M109 Gout, unspecified: Secondary | ICD-10-CM | POA: Diagnosis not present

## 2015-06-24 DIAGNOSIS — Z79899 Other long term (current) drug therapy: Secondary | ICD-10-CM | POA: Diagnosis not present

## 2015-06-24 DIAGNOSIS — R350 Frequency of micturition: Secondary | ICD-10-CM | POA: Diagnosis not present

## 2015-08-09 DIAGNOSIS — M2041 Other hammer toe(s) (acquired), right foot: Secondary | ICD-10-CM | POA: Diagnosis not present

## 2015-08-09 DIAGNOSIS — M659 Synovitis and tenosynovitis, unspecified: Secondary | ICD-10-CM | POA: Diagnosis not present

## 2015-08-11 DIAGNOSIS — R040 Epistaxis: Secondary | ICD-10-CM | POA: Diagnosis not present

## 2015-09-22 DIAGNOSIS — C44319 Basal cell carcinoma of skin of other parts of face: Secondary | ICD-10-CM | POA: Diagnosis not present

## 2015-09-22 DIAGNOSIS — D3617 Benign neoplasm of peripheral nerves and autonomic nervous system of trunk, unspecified: Secondary | ICD-10-CM | POA: Diagnosis not present

## 2015-09-22 DIAGNOSIS — D1801 Hemangioma of skin and subcutaneous tissue: Secondary | ICD-10-CM | POA: Diagnosis not present

## 2015-09-22 DIAGNOSIS — L821 Other seborrheic keratosis: Secondary | ICD-10-CM | POA: Diagnosis not present

## 2015-09-22 DIAGNOSIS — Z85828 Personal history of other malignant neoplasm of skin: Secondary | ICD-10-CM | POA: Diagnosis not present

## 2015-09-22 DIAGNOSIS — C44619 Basal cell carcinoma of skin of left upper limb, including shoulder: Secondary | ICD-10-CM | POA: Diagnosis not present

## 2015-10-06 DIAGNOSIS — C44319 Basal cell carcinoma of skin of other parts of face: Secondary | ICD-10-CM | POA: Diagnosis not present

## 2015-10-06 DIAGNOSIS — Z85828 Personal history of other malignant neoplasm of skin: Secondary | ICD-10-CM | POA: Diagnosis not present

## 2016-01-13 DIAGNOSIS — R413 Other amnesia: Secondary | ICD-10-CM | POA: Diagnosis not present

## 2016-01-13 DIAGNOSIS — R51 Headache: Secondary | ICD-10-CM | POA: Diagnosis not present

## 2016-01-14 ENCOUNTER — Other Ambulatory Visit: Payer: Self-pay | Admitting: Family Medicine

## 2016-01-14 DIAGNOSIS — G4489 Other headache syndrome: Secondary | ICD-10-CM

## 2016-01-20 ENCOUNTER — Ambulatory Visit
Admission: RE | Admit: 2016-01-20 | Discharge: 2016-01-20 | Disposition: A | Discharge status: Home / Self Care | Payer: PPO | Source: Ambulatory Visit | Attending: Family Medicine | Admitting: Family Medicine

## 2016-01-20 DIAGNOSIS — G4489 Other headache syndrome: Secondary | ICD-10-CM

## 2016-01-20 DIAGNOSIS — S0990XA Unspecified injury of head, initial encounter: Secondary | ICD-10-CM | POA: Diagnosis not present

## 2016-02-11 DIAGNOSIS — C44311 Basal cell carcinoma of skin of nose: Secondary | ICD-10-CM | POA: Diagnosis not present

## 2016-02-11 DIAGNOSIS — Z85828 Personal history of other malignant neoplasm of skin: Secondary | ICD-10-CM | POA: Diagnosis not present

## 2016-02-15 DIAGNOSIS — H2513 Age-related nuclear cataract, bilateral: Secondary | ICD-10-CM | POA: Diagnosis not present

## 2016-02-15 DIAGNOSIS — H04223 Epiphora due to insufficient drainage, bilateral lacrimal glands: Secondary | ICD-10-CM | POA: Diagnosis not present

## 2016-02-15 DIAGNOSIS — H52203 Unspecified astigmatism, bilateral: Secondary | ICD-10-CM | POA: Diagnosis not present

## 2016-02-15 DIAGNOSIS — H04563 Stenosis of bilateral lacrimal punctum: Secondary | ICD-10-CM | POA: Diagnosis not present

## 2016-02-28 DIAGNOSIS — H04561 Stenosis of right lacrimal punctum: Secondary | ICD-10-CM | POA: Diagnosis not present

## 2016-02-28 DIAGNOSIS — H04562 Stenosis of left lacrimal punctum: Secondary | ICD-10-CM | POA: Diagnosis not present

## 2016-03-07 DIAGNOSIS — C44311 Basal cell carcinoma of skin of nose: Secondary | ICD-10-CM | POA: Diagnosis not present

## 2016-03-07 DIAGNOSIS — Z85828 Personal history of other malignant neoplasm of skin: Secondary | ICD-10-CM | POA: Diagnosis not present

## 2016-07-18 DIAGNOSIS — M6289 Other specified disorders of muscle: Secondary | ICD-10-CM | POA: Diagnosis not present

## 2016-07-18 DIAGNOSIS — E559 Vitamin D deficiency, unspecified: Secondary | ICD-10-CM | POA: Diagnosis not present

## 2016-07-18 DIAGNOSIS — M109 Gout, unspecified: Secondary | ICD-10-CM | POA: Diagnosis not present

## 2016-07-18 DIAGNOSIS — R350 Frequency of micturition: Secondary | ICD-10-CM | POA: Diagnosis not present

## 2016-07-18 DIAGNOSIS — Z0000 Encounter for general adult medical examination without abnormal findings: Secondary | ICD-10-CM | POA: Diagnosis not present

## 2016-07-18 DIAGNOSIS — E7800 Pure hypercholesterolemia, unspecified: Secondary | ICD-10-CM | POA: Diagnosis not present

## 2016-07-18 DIAGNOSIS — G609 Hereditary and idiopathic neuropathy, unspecified: Secondary | ICD-10-CM | POA: Diagnosis not present

## 2016-07-18 DIAGNOSIS — Z79899 Other long term (current) drug therapy: Secondary | ICD-10-CM | POA: Diagnosis not present

## 2016-07-18 DIAGNOSIS — E890 Postprocedural hypothyroidism: Secondary | ICD-10-CM | POA: Diagnosis not present

## 2016-08-30 DIAGNOSIS — L814 Other melanin hyperpigmentation: Secondary | ICD-10-CM | POA: Diagnosis not present

## 2016-08-30 DIAGNOSIS — C44519 Basal cell carcinoma of skin of other part of trunk: Secondary | ICD-10-CM | POA: Diagnosis not present

## 2016-08-30 DIAGNOSIS — L570 Actinic keratosis: Secondary | ICD-10-CM | POA: Diagnosis not present

## 2016-08-30 DIAGNOSIS — L821 Other seborrheic keratosis: Secondary | ICD-10-CM | POA: Diagnosis not present

## 2016-08-30 DIAGNOSIS — D1801 Hemangioma of skin and subcutaneous tissue: Secondary | ICD-10-CM | POA: Diagnosis not present

## 2016-08-30 DIAGNOSIS — Z85828 Personal history of other malignant neoplasm of skin: Secondary | ICD-10-CM | POA: Diagnosis not present

## 2016-08-30 DIAGNOSIS — L820 Inflamed seborrheic keratosis: Secondary | ICD-10-CM | POA: Diagnosis not present

## 2016-09-06 DIAGNOSIS — M79605 Pain in left leg: Secondary | ICD-10-CM | POA: Diagnosis not present

## 2016-09-06 DIAGNOSIS — M79604 Pain in right leg: Secondary | ICD-10-CM | POA: Diagnosis not present

## 2016-09-06 DIAGNOSIS — M4850XA Collapsed vertebra, not elsewhere classified, site unspecified, initial encounter for fracture: Secondary | ICD-10-CM | POA: Diagnosis not present

## 2016-10-18 DIAGNOSIS — M4854XA Collapsed vertebra, not elsewhere classified, thoracic region, initial encounter for fracture: Secondary | ICD-10-CM | POA: Diagnosis not present

## 2017-01-17 DIAGNOSIS — D3131 Benign neoplasm of right choroid: Secondary | ICD-10-CM | POA: Diagnosis not present

## 2017-01-17 DIAGNOSIS — H2513 Age-related nuclear cataract, bilateral: Secondary | ICD-10-CM | POA: Diagnosis not present

## 2017-01-17 DIAGNOSIS — H52203 Unspecified astigmatism, bilateral: Secondary | ICD-10-CM | POA: Diagnosis not present

## 2017-01-17 DIAGNOSIS — H04213 Epiphora due to excess lacrimation, bilateral lacrimal glands: Secondary | ICD-10-CM | POA: Diagnosis not present

## 2017-08-31 DIAGNOSIS — E7800 Pure hypercholesterolemia, unspecified: Secondary | ICD-10-CM | POA: Diagnosis not present

## 2017-08-31 DIAGNOSIS — M109 Gout, unspecified: Secondary | ICD-10-CM | POA: Diagnosis not present

## 2017-08-31 DIAGNOSIS — E559 Vitamin D deficiency, unspecified: Secondary | ICD-10-CM | POA: Diagnosis not present

## 2017-08-31 DIAGNOSIS — E890 Postprocedural hypothyroidism: Secondary | ICD-10-CM | POA: Diagnosis not present

## 2017-08-31 DIAGNOSIS — Z79899 Other long term (current) drug therapy: Secondary | ICD-10-CM | POA: Diagnosis not present

## 2017-09-07 DIAGNOSIS — M109 Gout, unspecified: Secondary | ICD-10-CM | POA: Diagnosis not present

## 2017-09-07 DIAGNOSIS — E890 Postprocedural hypothyroidism: Secondary | ICD-10-CM | POA: Diagnosis not present

## 2017-09-07 DIAGNOSIS — N400 Enlarged prostate without lower urinary tract symptoms: Secondary | ICD-10-CM | POA: Diagnosis not present

## 2017-09-07 DIAGNOSIS — Z0000 Encounter for general adult medical examination without abnormal findings: Secondary | ICD-10-CM | POA: Diagnosis not present

## 2017-09-07 DIAGNOSIS — K4090 Unilateral inguinal hernia, without obstruction or gangrene, not specified as recurrent: Secondary | ICD-10-CM | POA: Diagnosis not present

## 2017-09-07 DIAGNOSIS — E7800 Pure hypercholesterolemia, unspecified: Secondary | ICD-10-CM | POA: Diagnosis not present

## 2017-09-07 DIAGNOSIS — E559 Vitamin D deficiency, unspecified: Secondary | ICD-10-CM | POA: Diagnosis not present

## 2017-09-07 DIAGNOSIS — G609 Hereditary and idiopathic neuropathy, unspecified: Secondary | ICD-10-CM | POA: Diagnosis not present

## 2017-09-07 DIAGNOSIS — K429 Umbilical hernia without obstruction or gangrene: Secondary | ICD-10-CM | POA: Diagnosis not present

## 2017-10-08 DIAGNOSIS — D485 Neoplasm of uncertain behavior of skin: Secondary | ICD-10-CM | POA: Diagnosis not present

## 2017-10-08 DIAGNOSIS — Z85828 Personal history of other malignant neoplasm of skin: Secondary | ICD-10-CM | POA: Diagnosis not present

## 2017-10-08 DIAGNOSIS — L821 Other seborrheic keratosis: Secondary | ICD-10-CM | POA: Diagnosis not present

## 2017-10-08 DIAGNOSIS — L570 Actinic keratosis: Secondary | ICD-10-CM | POA: Diagnosis not present

## 2017-10-08 DIAGNOSIS — C44311 Basal cell carcinoma of skin of nose: Secondary | ICD-10-CM | POA: Diagnosis not present

## 2017-10-08 DIAGNOSIS — D1801 Hemangioma of skin and subcutaneous tissue: Secondary | ICD-10-CM | POA: Diagnosis not present

## 2017-10-12 DIAGNOSIS — M6208 Separation of muscle (nontraumatic), other site: Secondary | ICD-10-CM | POA: Diagnosis not present

## 2017-10-12 DIAGNOSIS — K429 Umbilical hernia without obstruction or gangrene: Secondary | ICD-10-CM | POA: Diagnosis not present

## 2017-10-12 DIAGNOSIS — K4090 Unilateral inguinal hernia, without obstruction or gangrene, not specified as recurrent: Secondary | ICD-10-CM | POA: Diagnosis not present

## 2017-11-07 DIAGNOSIS — Z85828 Personal history of other malignant neoplasm of skin: Secondary | ICD-10-CM | POA: Diagnosis not present

## 2017-11-07 DIAGNOSIS — C4401 Basal cell carcinoma of skin of lip: Secondary | ICD-10-CM | POA: Diagnosis not present

## 2017-11-15 DIAGNOSIS — Z4802 Encounter for removal of sutures: Secondary | ICD-10-CM | POA: Diagnosis not present

## 2018-06-26 DIAGNOSIS — K4090 Unilateral inguinal hernia, without obstruction or gangrene, not specified as recurrent: Secondary | ICD-10-CM | POA: Diagnosis not present

## 2018-06-26 DIAGNOSIS — K429 Umbilical hernia without obstruction or gangrene: Secondary | ICD-10-CM | POA: Diagnosis not present

## 2018-06-26 DIAGNOSIS — M6208 Separation of muscle (nontraumatic), other site: Secondary | ICD-10-CM | POA: Diagnosis not present

## 2018-09-09 DIAGNOSIS — N400 Enlarged prostate without lower urinary tract symptoms: Secondary | ICD-10-CM | POA: Diagnosis not present

## 2018-09-09 DIAGNOSIS — E890 Postprocedural hypothyroidism: Secondary | ICD-10-CM | POA: Diagnosis not present

## 2018-09-09 DIAGNOSIS — Z131 Encounter for screening for diabetes mellitus: Secondary | ICD-10-CM | POA: Diagnosis not present

## 2018-09-09 DIAGNOSIS — M109 Gout, unspecified: Secondary | ICD-10-CM | POA: Diagnosis not present

## 2018-09-09 DIAGNOSIS — E559 Vitamin D deficiency, unspecified: Secondary | ICD-10-CM | POA: Diagnosis not present

## 2018-09-09 DIAGNOSIS — G609 Hereditary and idiopathic neuropathy, unspecified: Secondary | ICD-10-CM | POA: Diagnosis not present

## 2018-09-09 DIAGNOSIS — E7800 Pure hypercholesterolemia, unspecified: Secondary | ICD-10-CM | POA: Diagnosis not present

## 2018-09-09 DIAGNOSIS — Z0000 Encounter for general adult medical examination without abnormal findings: Secondary | ICD-10-CM | POA: Diagnosis not present

## 2018-10-17 DIAGNOSIS — D485 Neoplasm of uncertain behavior of skin: Secondary | ICD-10-CM | POA: Diagnosis not present

## 2018-10-17 DIAGNOSIS — Z85828 Personal history of other malignant neoplasm of skin: Secondary | ICD-10-CM | POA: Diagnosis not present

## 2018-10-17 DIAGNOSIS — D1801 Hemangioma of skin and subcutaneous tissue: Secondary | ICD-10-CM | POA: Diagnosis not present

## 2018-10-17 DIAGNOSIS — L720 Epidermal cyst: Secondary | ICD-10-CM | POA: Diagnosis not present

## 2018-10-17 DIAGNOSIS — L570 Actinic keratosis: Secondary | ICD-10-CM | POA: Diagnosis not present

## 2018-12-04 DIAGNOSIS — K429 Umbilical hernia without obstruction or gangrene: Secondary | ICD-10-CM | POA: Diagnosis not present

## 2018-12-04 DIAGNOSIS — K4090 Unilateral inguinal hernia, without obstruction or gangrene, not specified as recurrent: Secondary | ICD-10-CM | POA: Diagnosis not present

## 2018-12-04 DIAGNOSIS — M6208 Separation of muscle (nontraumatic), other site: Secondary | ICD-10-CM | POA: Diagnosis not present

## 2018-12-04 IMAGING — CT CT HEAD W/O CM
1 series · 16 of 30 positions shown, 20 images · non-contrast
Comparison: None.

CLINICAL DATA: Head injury [REDACTED]

EXAM:
CT HEAD WITHOUT CONTRAST
TECHNIQUE: Contiguous axial images were obtained from the base of the skull
through the vertex without intravenous contrast.

[Series 2: head w/(date) · axial · 0.42mm/px · z∈[-25,+110]mm · 16 of 31 slices shown, 20 images]
[im 2/31  brain]
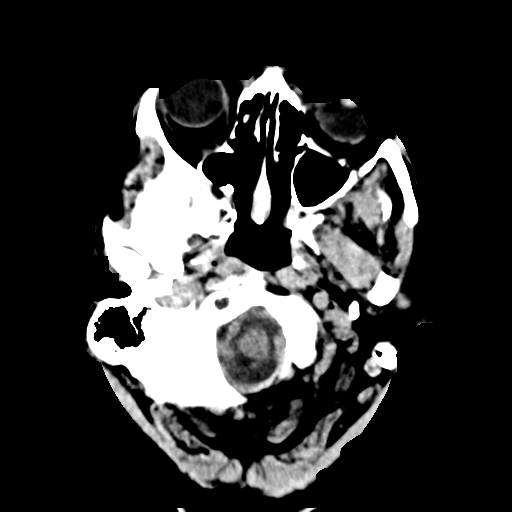
[im 2/31  bone]
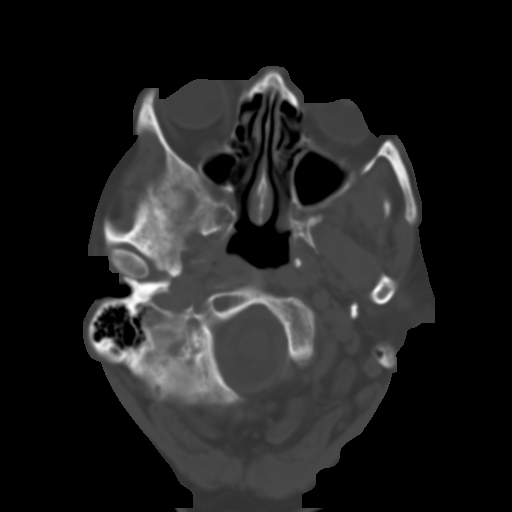
[im 4/31  brain]
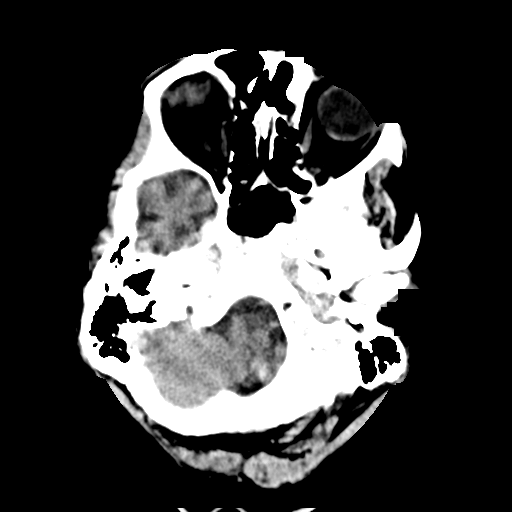
[im 6/31  brain]
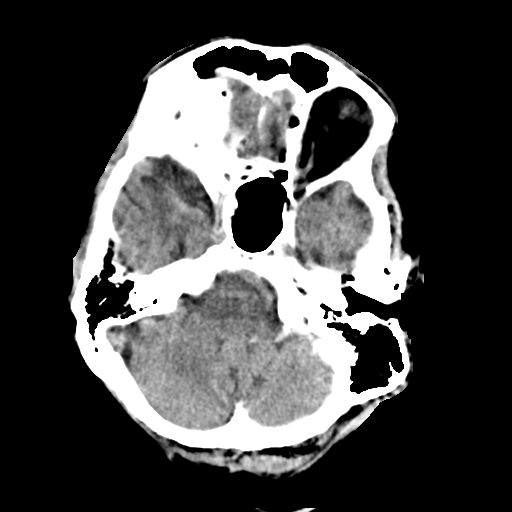
[im 8/31  brain]
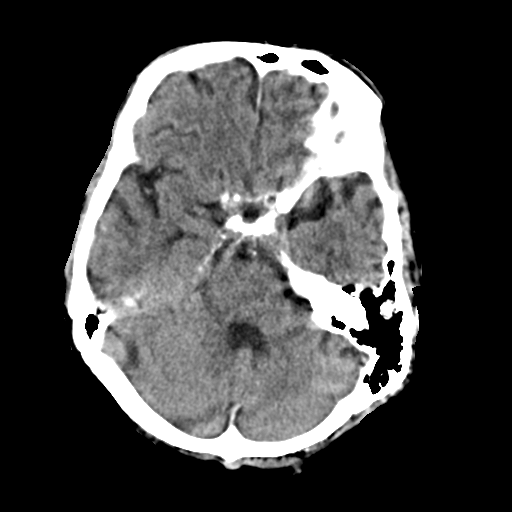
[im 9/31  brain]
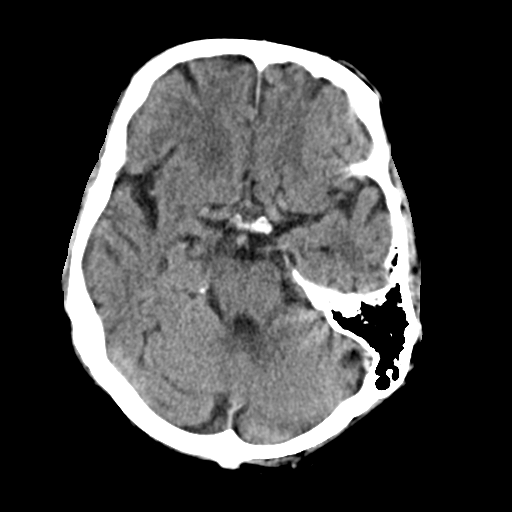
[im 9/31  bone]
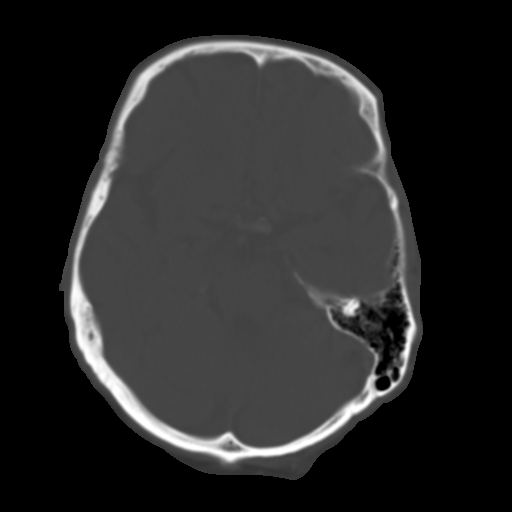
[im 11/31  brain]
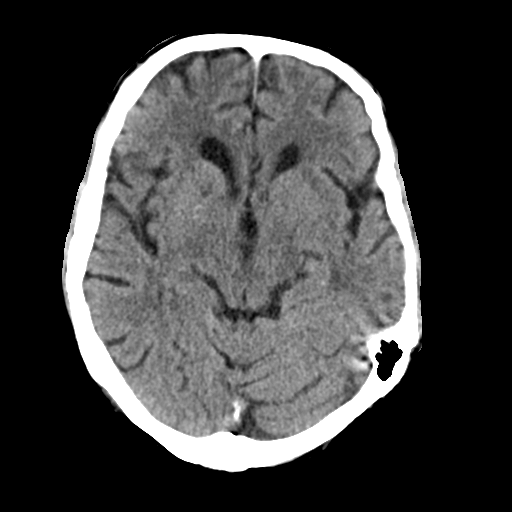
[im 13/31  brain]
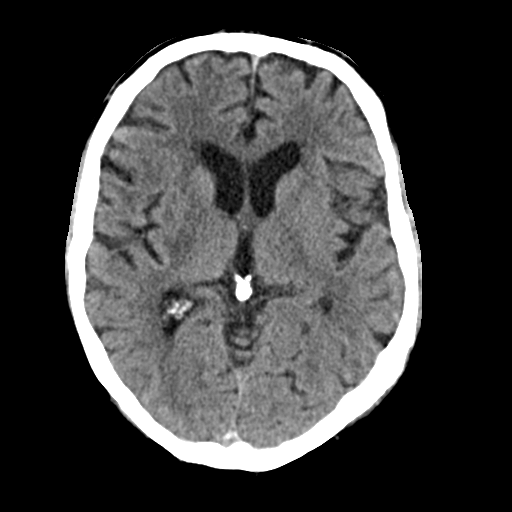
[im 15/31  brain]
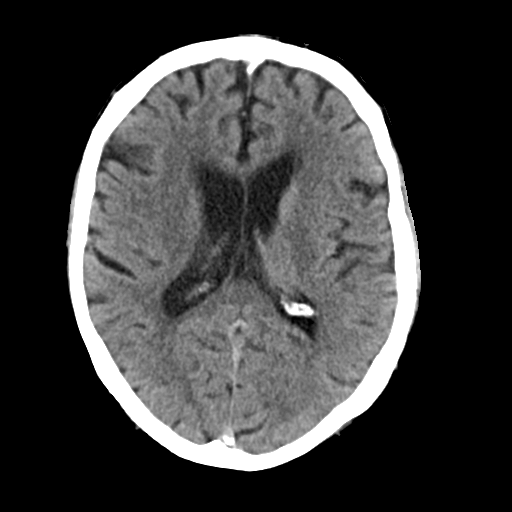
[im 16/31  brain]
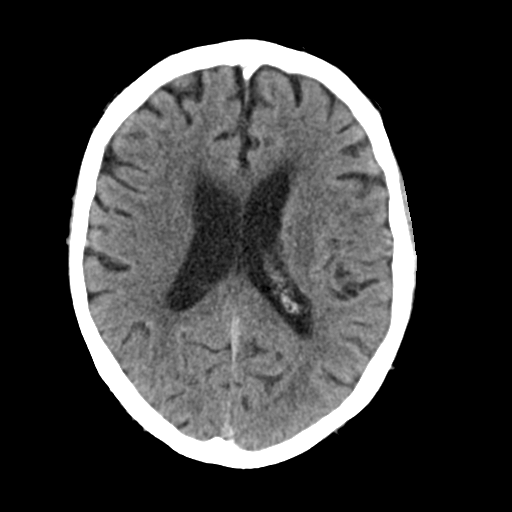
[im 16/31  bone]
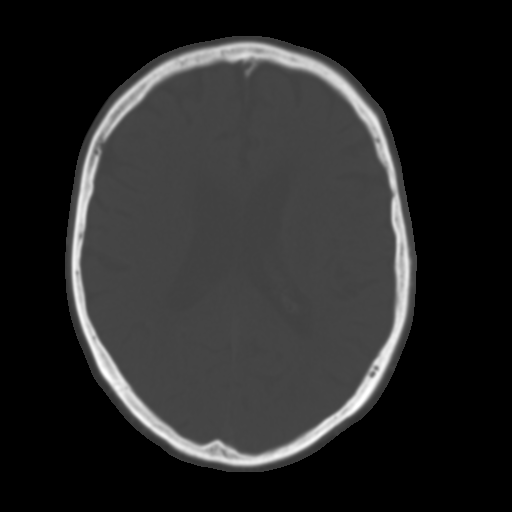
[im 18/31  brain]
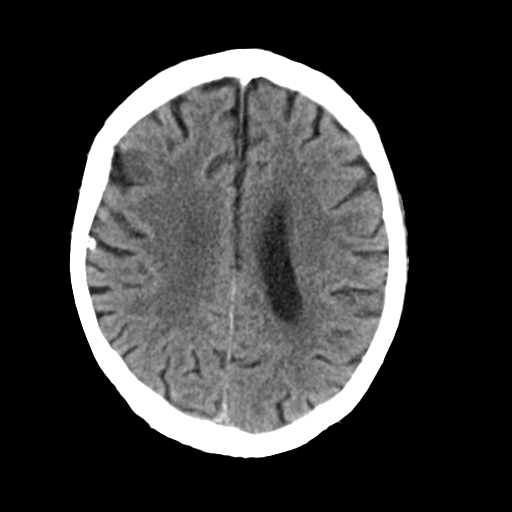
[im 20/31  brain]
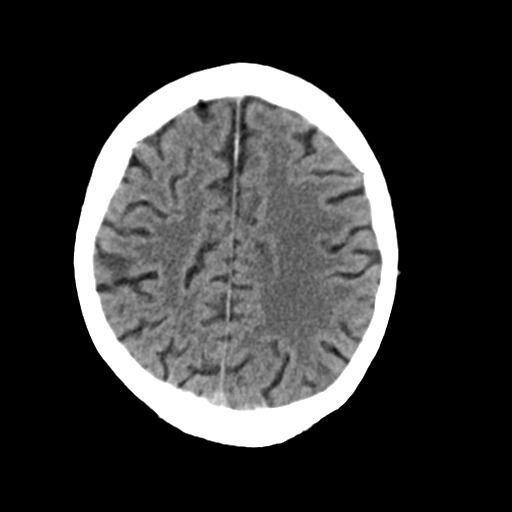
[im 22/31  brain]
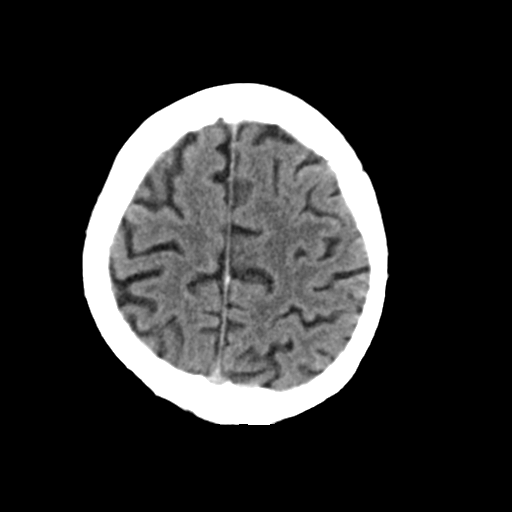
[im 23/31  brain]
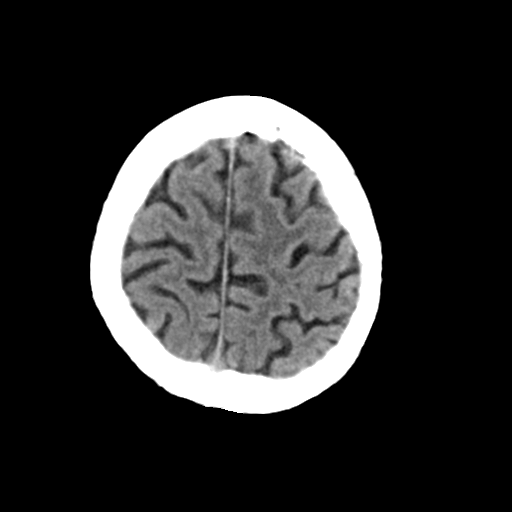
[im 23/31  bone]
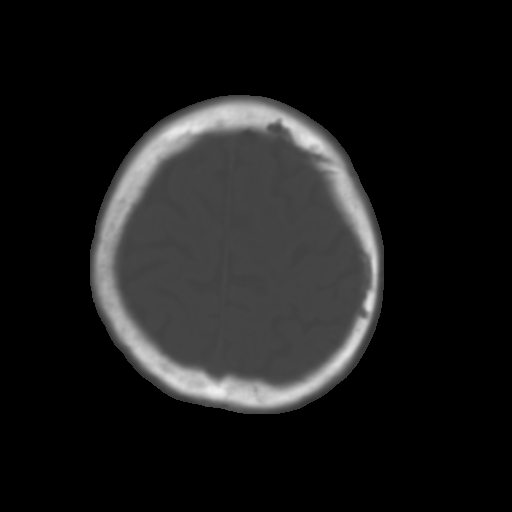
[im 25/31  brain]
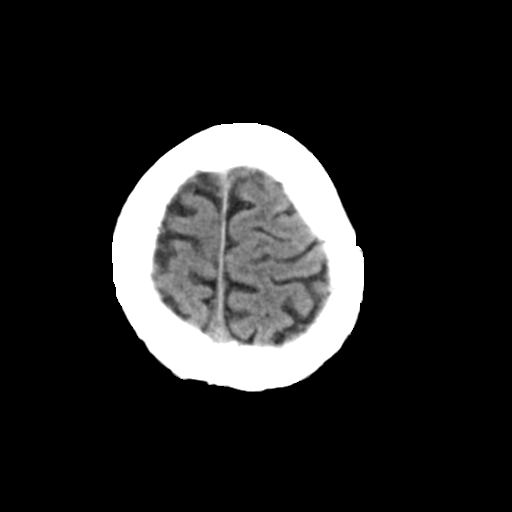
[im 27/31  brain]
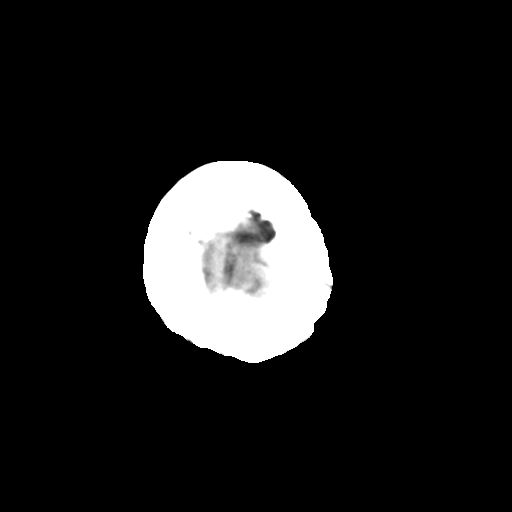
[im 29/31  brain]
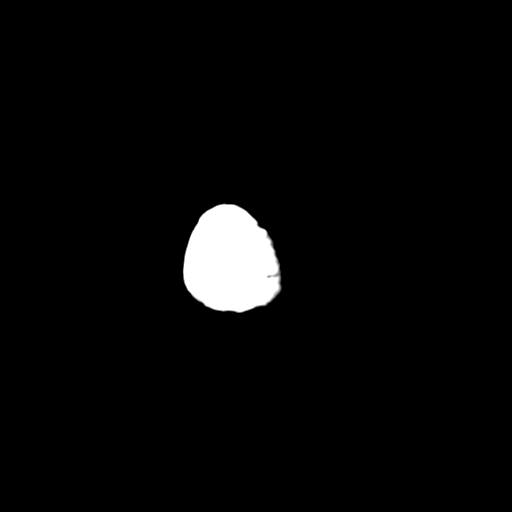

[16 of 30 positions shown; findings below may reference images not displayed]

FINDINGS: Brain: No intracranial hemorrhage, mass effect or midline shift.
Mild cerebral atrophy. Mild periventricular white matter decreased
attenuation probable due to chronic small vessel ischemic changes.
No acute cortical infarction. There is 4 mm calcified skull based
meningioma in right parietal region. No mass effect or surrounding
edema.

Vascular: Atherosclerotic calcifications of carotid siphon are
noted.

Skull: No skull fracture is noted.

Sinuses/Orbits: No acute findings.

Other: None
IMPRESSION: 1. No acute intracranial abnormality. Mild cerebral atrophy. Mild
periventricular white matter decreased attenuation probable to
chronic small vessel ischemic changes. No acute cortical infarction.

## 2018-12-16 DIAGNOSIS — E890 Postprocedural hypothyroidism: Secondary | ICD-10-CM | POA: Diagnosis not present

## 2018-12-18 DIAGNOSIS — E7800 Pure hypercholesterolemia, unspecified: Secondary | ICD-10-CM | POA: Diagnosis not present

## 2018-12-18 DIAGNOSIS — E890 Postprocedural hypothyroidism: Secondary | ICD-10-CM | POA: Diagnosis not present

## 2018-12-18 DIAGNOSIS — N400 Enlarged prostate without lower urinary tract symptoms: Secondary | ICD-10-CM | POA: Diagnosis not present

## 2019-01-31 DIAGNOSIS — E890 Postprocedural hypothyroidism: Secondary | ICD-10-CM | POA: Diagnosis not present

## 2019-01-31 DIAGNOSIS — N400 Enlarged prostate without lower urinary tract symptoms: Secondary | ICD-10-CM | POA: Diagnosis not present

## 2019-01-31 DIAGNOSIS — E7800 Pure hypercholesterolemia, unspecified: Secondary | ICD-10-CM | POA: Diagnosis not present

## 2019-02-08 ENCOUNTER — Ambulatory Visit: Payer: PPO | Attending: Internal Medicine

## 2019-02-08 DIAGNOSIS — Z23 Encounter for immunization: Secondary | ICD-10-CM

## 2019-02-08 NOTE — Progress Notes (Signed)
   Covid-19 Vaccination Clinic  Name:  Steve Thompson    MRN: [id number] DOB: [birth year]  02/08/2019  [name] was observed post Covid-19 immunization for 15 minutes without incidence. He was provided with Vaccine Information Sheet and instruction to access the V-Safe system.   [name] was instructed to call 911 with any severe reactions post vaccine: Marland Kitchen Difficulty breathing  . Swelling of your face and throat  . A fast heartbeat  . A bad rash all over your body  . Dizziness and weakness    Immunizations Administered    Name Date Dose VIS Date Route   Pfizer COVID-19 Vaccine 02/08/2019 12:42 PM 0.3 mL 12/27/2018 Intramuscular   Manufacturer: [place]   Lot: [id number]   [place]: [id number]

## 2019-03-01 ENCOUNTER — Ambulatory Visit: Payer: PPO | Attending: Internal Medicine

## 2019-03-01 DIAGNOSIS — Z23 Encounter for immunization: Secondary | ICD-10-CM

## 2019-03-01 NOTE — Progress Notes (Signed)
   Covid-19 Vaccination Clinic  Name:  Steve Thompson    MRN: [id number] DOB: 10-Jan-1932  03/01/2019  Mr. Steve Thompson was observed post Covid-19 immunization for 15 minutes without incidence. He was provided with Vaccine Information Sheet and instruction to access the V-Safe system.   Mr. Steve Thompson was instructed to call 911 with any severe reactions post vaccine: Marland Kitchen Difficulty breathing  . Swelling of your face and throat  . A fast heartbeat  . A bad rash all over your body  . Dizziness and weakness    Immunizations Administered    Name Date Dose VIS Date Route   Pfizer COVID-19 Vaccine 03/01/2019 10:35 AM 0.3 mL 12/27/2018 Intramuscular   Manufacturer: [place]   Lot: [id number]   [place]: [id number]

## 2019-03-18 DIAGNOSIS — N400 Enlarged prostate without lower urinary tract symptoms: Secondary | ICD-10-CM | POA: Diagnosis not present

## 2019-03-18 DIAGNOSIS — E7800 Pure hypercholesterolemia, unspecified: Secondary | ICD-10-CM | POA: Diagnosis not present

## 2019-03-18 DIAGNOSIS — E890 Postprocedural hypothyroidism: Secondary | ICD-10-CM | POA: Diagnosis not present

## 2019-04-06 DIAGNOSIS — M10072 Idiopathic gout, left ankle and foot: Secondary | ICD-10-CM | POA: Diagnosis not present

## 2019-05-08 DIAGNOSIS — E7800 Pure hypercholesterolemia, unspecified: Secondary | ICD-10-CM | POA: Diagnosis not present

## 2019-05-08 DIAGNOSIS — N400 Enlarged prostate without lower urinary tract symptoms: Secondary | ICD-10-CM | POA: Diagnosis not present

## 2019-05-08 DIAGNOSIS — E890 Postprocedural hypothyroidism: Secondary | ICD-10-CM | POA: Diagnosis not present

## 2019-07-07 DIAGNOSIS — B029 Zoster without complications: Secondary | ICD-10-CM | POA: Diagnosis not present

## 2019-07-08 DIAGNOSIS — H52203 Unspecified astigmatism, bilateral: Secondary | ICD-10-CM | POA: Diagnosis not present

## 2019-07-08 DIAGNOSIS — H2513 Age-related nuclear cataract, bilateral: Secondary | ICD-10-CM | POA: Diagnosis not present

## 2019-07-08 DIAGNOSIS — D3131 Benign neoplasm of right choroid: Secondary | ICD-10-CM | POA: Diagnosis not present

## 2019-07-10 DIAGNOSIS — K4090 Unilateral inguinal hernia, without obstruction or gangrene, not specified as recurrent: Secondary | ICD-10-CM | POA: Diagnosis not present

## 2019-07-10 DIAGNOSIS — M6208 Separation of muscle (nontraumatic), other site: Secondary | ICD-10-CM | POA: Diagnosis not present

## 2019-07-10 DIAGNOSIS — K429 Umbilical hernia without obstruction or gangrene: Secondary | ICD-10-CM | POA: Diagnosis not present

## 2019-10-20 DIAGNOSIS — N400 Enlarged prostate without lower urinary tract symptoms: Secondary | ICD-10-CM | POA: Diagnosis not present

## 2019-10-20 DIAGNOSIS — E890 Postprocedural hypothyroidism: Secondary | ICD-10-CM | POA: Diagnosis not present

## 2019-10-20 DIAGNOSIS — E7800 Pure hypercholesterolemia, unspecified: Secondary | ICD-10-CM | POA: Diagnosis not present

## 2019-11-04 DIAGNOSIS — Z85828 Personal history of other malignant neoplasm of skin: Secondary | ICD-10-CM | POA: Diagnosis not present

## 2019-11-04 DIAGNOSIS — C44519 Basal cell carcinoma of skin of other part of trunk: Secondary | ICD-10-CM | POA: Diagnosis not present

## 2019-11-04 DIAGNOSIS — L570 Actinic keratosis: Secondary | ICD-10-CM | POA: Diagnosis not present

## 2019-11-04 DIAGNOSIS — D225 Melanocytic nevi of trunk: Secondary | ICD-10-CM | POA: Diagnosis not present

## 2019-11-04 DIAGNOSIS — D0462 Carcinoma in situ of skin of left upper limb, including shoulder: Secondary | ICD-10-CM | POA: Diagnosis not present

## 2019-11-04 DIAGNOSIS — L821 Other seborrheic keratosis: Secondary | ICD-10-CM | POA: Diagnosis not present

## 2019-11-04 DIAGNOSIS — D1801 Hemangioma of skin and subcutaneous tissue: Secondary | ICD-10-CM | POA: Diagnosis not present

## 2019-11-04 DIAGNOSIS — L814 Other melanin hyperpigmentation: Secondary | ICD-10-CM | POA: Diagnosis not present

## 2019-12-23 DIAGNOSIS — N400 Enlarged prostate without lower urinary tract symptoms: Secondary | ICD-10-CM | POA: Diagnosis not present

## 2019-12-23 DIAGNOSIS — E890 Postprocedural hypothyroidism: Secondary | ICD-10-CM | POA: Diagnosis not present

## 2019-12-23 DIAGNOSIS — E7800 Pure hypercholesterolemia, unspecified: Secondary | ICD-10-CM | POA: Diagnosis not present

## 2020-01-20 DIAGNOSIS — Z0000 Encounter for general adult medical examination without abnormal findings: Secondary | ICD-10-CM | POA: Diagnosis not present

## 2020-01-20 DIAGNOSIS — N400 Enlarged prostate without lower urinary tract symptoms: Secondary | ICD-10-CM | POA: Diagnosis not present

## 2020-01-20 DIAGNOSIS — E7800 Pure hypercholesterolemia, unspecified: Secondary | ICD-10-CM | POA: Diagnosis not present

## 2020-01-20 DIAGNOSIS — Z79899 Other long term (current) drug therapy: Secondary | ICD-10-CM | POA: Diagnosis not present

## 2020-01-20 DIAGNOSIS — M109 Gout, unspecified: Secondary | ICD-10-CM | POA: Diagnosis not present

## 2020-01-20 DIAGNOSIS — E559 Vitamin D deficiency, unspecified: Secondary | ICD-10-CM | POA: Diagnosis not present

## 2020-01-20 DIAGNOSIS — G479 Sleep disorder, unspecified: Secondary | ICD-10-CM | POA: Diagnosis not present

## 2020-01-20 DIAGNOSIS — E890 Postprocedural hypothyroidism: Secondary | ICD-10-CM | POA: Diagnosis not present

## 2020-03-24 DIAGNOSIS — N400 Enlarged prostate without lower urinary tract symptoms: Secondary | ICD-10-CM | POA: Diagnosis not present

## 2020-03-24 DIAGNOSIS — E7800 Pure hypercholesterolemia, unspecified: Secondary | ICD-10-CM | POA: Diagnosis not present

## 2020-03-24 DIAGNOSIS — E890 Postprocedural hypothyroidism: Secondary | ICD-10-CM | POA: Diagnosis not present

## 2020-04-19 DIAGNOSIS — R030 Elevated blood-pressure reading, without diagnosis of hypertension: Secondary | ICD-10-CM | POA: Diagnosis not present

## 2020-04-28 DIAGNOSIS — E890 Postprocedural hypothyroidism: Secondary | ICD-10-CM | POA: Diagnosis not present

## 2020-04-28 DIAGNOSIS — M79673 Pain in unspecified foot: Secondary | ICD-10-CM | POA: Diagnosis not present

## 2020-04-28 DIAGNOSIS — E7800 Pure hypercholesterolemia, unspecified: Secondary | ICD-10-CM | POA: Diagnosis not present

## 2020-04-28 DIAGNOSIS — R253 Fasciculation: Secondary | ICD-10-CM | POA: Diagnosis not present

## 2020-04-28 DIAGNOSIS — N400 Enlarged prostate without lower urinary tract symptoms: Secondary | ICD-10-CM | POA: Diagnosis not present

## 2020-06-07 DIAGNOSIS — E890 Postprocedural hypothyroidism: Secondary | ICD-10-CM | POA: Diagnosis not present

## 2020-06-07 DIAGNOSIS — E7800 Pure hypercholesterolemia, unspecified: Secondary | ICD-10-CM | POA: Diagnosis not present

## 2020-06-07 DIAGNOSIS — N400 Enlarged prostate without lower urinary tract symptoms: Secondary | ICD-10-CM | POA: Diagnosis not present

## 2020-06-21 DIAGNOSIS — H52203 Unspecified astigmatism, bilateral: Secondary | ICD-10-CM | POA: Diagnosis not present

## 2020-06-21 DIAGNOSIS — H2513 Age-related nuclear cataract, bilateral: Secondary | ICD-10-CM | POA: Diagnosis not present

## 2020-06-21 DIAGNOSIS — D3131 Benign neoplasm of right choroid: Secondary | ICD-10-CM | POA: Diagnosis not present

## 2020-07-06 DIAGNOSIS — U071 COVID-19: Secondary | ICD-10-CM | POA: Diagnosis not present

## 2020-07-06 DIAGNOSIS — J029 Acute pharyngitis, unspecified: Secondary | ICD-10-CM | POA: Diagnosis not present

## 2020-07-06 DIAGNOSIS — R059 Cough, unspecified: Secondary | ICD-10-CM | POA: Diagnosis not present

## 2020-07-13 ENCOUNTER — Ambulatory Visit: Payer: PPO | Admitting: Neurology

## 2020-07-23 DIAGNOSIS — N400 Enlarged prostate without lower urinary tract symptoms: Secondary | ICD-10-CM | POA: Diagnosis not present

## 2020-07-23 DIAGNOSIS — E7800 Pure hypercholesterolemia, unspecified: Secondary | ICD-10-CM | POA: Diagnosis not present

## 2020-07-23 DIAGNOSIS — E890 Postprocedural hypothyroidism: Secondary | ICD-10-CM | POA: Diagnosis not present

## 2020-08-23 DIAGNOSIS — C44619 Basal cell carcinoma of skin of left upper limb, including shoulder: Secondary | ICD-10-CM | POA: Diagnosis not present

## 2020-08-23 DIAGNOSIS — L820 Inflamed seborrheic keratosis: Secondary | ICD-10-CM | POA: Diagnosis not present

## 2020-08-23 DIAGNOSIS — L570 Actinic keratosis: Secondary | ICD-10-CM | POA: Diagnosis not present

## 2020-08-23 DIAGNOSIS — L814 Other melanin hyperpigmentation: Secondary | ICD-10-CM | POA: Diagnosis not present

## 2020-08-23 DIAGNOSIS — L821 Other seborrheic keratosis: Secondary | ICD-10-CM | POA: Diagnosis not present

## 2020-08-23 DIAGNOSIS — L218 Other seborrheic dermatitis: Secondary | ICD-10-CM | POA: Diagnosis not present

## 2020-08-23 DIAGNOSIS — D1801 Hemangioma of skin and subcutaneous tissue: Secondary | ICD-10-CM | POA: Diagnosis not present

## 2020-08-23 DIAGNOSIS — Z85828 Personal history of other malignant neoplasm of skin: Secondary | ICD-10-CM | POA: Diagnosis not present

## 2020-10-21 DIAGNOSIS — E7800 Pure hypercholesterolemia, unspecified: Secondary | ICD-10-CM | POA: Diagnosis not present

## 2020-10-21 DIAGNOSIS — E890 Postprocedural hypothyroidism: Secondary | ICD-10-CM | POA: Diagnosis not present

## 2020-10-21 DIAGNOSIS — N400 Enlarged prostate without lower urinary tract symptoms: Secondary | ICD-10-CM | POA: Diagnosis not present

## 2020-12-22 DIAGNOSIS — E7800 Pure hypercholesterolemia, unspecified: Secondary | ICD-10-CM | POA: Diagnosis not present

## 2020-12-22 DIAGNOSIS — E890 Postprocedural hypothyroidism: Secondary | ICD-10-CM | POA: Diagnosis not present

## 2020-12-22 DIAGNOSIS — N400 Enlarged prostate without lower urinary tract symptoms: Secondary | ICD-10-CM | POA: Diagnosis not present

## 2021-06-06 DIAGNOSIS — N400 Enlarged prostate without lower urinary tract symptoms: Secondary | ICD-10-CM | POA: Diagnosis not present

## 2021-06-06 DIAGNOSIS — E7800 Pure hypercholesterolemia, unspecified: Secondary | ICD-10-CM | POA: Diagnosis not present

## 2021-06-06 DIAGNOSIS — E890 Postprocedural hypothyroidism: Secondary | ICD-10-CM | POA: Diagnosis not present

## 2021-07-25 DIAGNOSIS — H02055 Trichiasis without entropian left lower eyelid: Secondary | ICD-10-CM | POA: Diagnosis not present

## 2021-07-25 DIAGNOSIS — H35371 Puckering of macula, right eye: Secondary | ICD-10-CM | POA: Diagnosis not present

## 2021-07-25 DIAGNOSIS — H2513 Age-related nuclear cataract, bilateral: Secondary | ICD-10-CM | POA: Diagnosis not present

## 2021-08-04 DIAGNOSIS — H21561 Pupillary abnormality, right eye: Secondary | ICD-10-CM | POA: Diagnosis not present

## 2021-08-04 DIAGNOSIS — H2181 Floppy iris syndrome: Secondary | ICD-10-CM | POA: Diagnosis not present

## 2021-08-04 DIAGNOSIS — H269 Unspecified cataract: Secondary | ICD-10-CM | POA: Diagnosis not present

## 2021-08-04 DIAGNOSIS — H2511 Age-related nuclear cataract, right eye: Secondary | ICD-10-CM | POA: Diagnosis not present

## 2021-08-10 DIAGNOSIS — L570 Actinic keratosis: Secondary | ICD-10-CM | POA: Diagnosis not present

## 2021-08-10 DIAGNOSIS — Z85828 Personal history of other malignant neoplasm of skin: Secondary | ICD-10-CM | POA: Diagnosis not present

## 2021-08-10 DIAGNOSIS — D692 Other nonthrombocytopenic purpura: Secondary | ICD-10-CM | POA: Diagnosis not present

## 2021-08-10 DIAGNOSIS — B078 Other viral warts: Secondary | ICD-10-CM | POA: Diagnosis not present

## 2021-08-10 DIAGNOSIS — C44329 Squamous cell carcinoma of skin of other parts of face: Secondary | ICD-10-CM | POA: Diagnosis not present

## 2021-08-10 DIAGNOSIS — C44629 Squamous cell carcinoma of skin of left upper limb, including shoulder: Secondary | ICD-10-CM | POA: Diagnosis not present

## 2021-08-10 DIAGNOSIS — L814 Other melanin hyperpigmentation: Secondary | ICD-10-CM | POA: Diagnosis not present

## 2021-08-10 DIAGNOSIS — L821 Other seborrheic keratosis: Secondary | ICD-10-CM | POA: Diagnosis not present

## 2021-08-31 DIAGNOSIS — H35371 Puckering of macula, right eye: Secondary | ICD-10-CM | POA: Diagnosis not present

## 2021-09-05 DIAGNOSIS — C44329 Squamous cell carcinoma of skin of other parts of face: Secondary | ICD-10-CM | POA: Diagnosis not present

## 2021-09-05 DIAGNOSIS — Z85828 Personal history of other malignant neoplasm of skin: Secondary | ICD-10-CM | POA: Diagnosis not present

## 2021-09-20 DIAGNOSIS — M25461 Effusion, right knee: Secondary | ICD-10-CM | POA: Diagnosis not present

## 2021-10-04 DIAGNOSIS — Z79899 Other long term (current) drug therapy: Secondary | ICD-10-CM | POA: Diagnosis not present

## 2021-10-04 DIAGNOSIS — E7800 Pure hypercholesterolemia, unspecified: Secondary | ICD-10-CM | POA: Diagnosis not present

## 2021-10-04 DIAGNOSIS — M109 Gout, unspecified: Secondary | ICD-10-CM | POA: Diagnosis not present

## 2021-10-04 DIAGNOSIS — E559 Vitamin D deficiency, unspecified: Secondary | ICD-10-CM | POA: Diagnosis not present

## 2021-10-11 DIAGNOSIS — R7301 Impaired fasting glucose: Secondary | ICD-10-CM | POA: Diagnosis not present

## 2021-10-11 DIAGNOSIS — Z79899 Other long term (current) drug therapy: Secondary | ICD-10-CM | POA: Diagnosis not present

## 2021-10-11 DIAGNOSIS — E559 Vitamin D deficiency, unspecified: Secondary | ICD-10-CM | POA: Diagnosis not present

## 2021-10-11 DIAGNOSIS — Z0000 Encounter for general adult medical examination without abnormal findings: Secondary | ICD-10-CM | POA: Diagnosis not present

## 2021-10-11 DIAGNOSIS — R350 Frequency of micturition: Secondary | ICD-10-CM | POA: Diagnosis not present

## 2021-10-11 DIAGNOSIS — M11869 Other specified crystal arthropathies, unspecified knee: Secondary | ICD-10-CM | POA: Diagnosis not present

## 2021-10-11 DIAGNOSIS — E7800 Pure hypercholesterolemia, unspecified: Secondary | ICD-10-CM | POA: Diagnosis not present

## 2021-10-11 DIAGNOSIS — Z6826 Body mass index (BMI) 26.0-26.9, adult: Secondary | ICD-10-CM | POA: Diagnosis not present

## 2021-10-11 DIAGNOSIS — E890 Postprocedural hypothyroidism: Secondary | ICD-10-CM | POA: Diagnosis not present

## 2021-10-11 DIAGNOSIS — G609 Hereditary and idiopathic neuropathy, unspecified: Secondary | ICD-10-CM | POA: Diagnosis not present

## 2021-10-20 DIAGNOSIS — M25561 Pain in right knee: Secondary | ICD-10-CM | POA: Diagnosis not present

## 2021-10-20 DIAGNOSIS — Z961 Presence of intraocular lens: Secondary | ICD-10-CM | POA: Diagnosis not present

## 2021-10-20 DIAGNOSIS — H35371 Puckering of macula, right eye: Secondary | ICD-10-CM | POA: Diagnosis not present

## 2021-11-03 DIAGNOSIS — M1711 Unilateral primary osteoarthritis, right knee: Secondary | ICD-10-CM | POA: Diagnosis not present

## 2021-11-03 DIAGNOSIS — M1120 Other chondrocalcinosis, unspecified site: Secondary | ICD-10-CM | POA: Diagnosis not present

## 2022-01-02 ENCOUNTER — Other Ambulatory Visit: Payer: Self-pay | Admitting: Ophthalmology

## 2022-01-02 DIAGNOSIS — H532 Diplopia: Secondary | ICD-10-CM | POA: Diagnosis not present

## 2022-01-12 ENCOUNTER — Ambulatory Visit
Admission: RE | Admit: 2022-01-12 | Discharge: 2022-01-12 | Disposition: A | Discharge status: Home / Self Care | Payer: PPO | Source: Ambulatory Visit | Attending: Ophthalmology | Admitting: Ophthalmology

## 2022-01-12 DIAGNOSIS — H532 Diplopia: Secondary | ICD-10-CM

## 2022-01-12 DIAGNOSIS — I672 Cerebral atherosclerosis: Secondary | ICD-10-CM | POA: Diagnosis not present

## 2022-01-12 DIAGNOSIS — J3489 Other specified disorders of nose and nasal sinuses: Secondary | ICD-10-CM | POA: Diagnosis not present

## 2022-01-12 MED ORDER — GADOBENATE DIMEGLUMINE 529 MG/ML IV SOLN
18.0000 mL | Freq: Once | INTRAVENOUS | Status: AC | PRN
  Administered 2022-01-12: 18 mL via INTRAVENOUS

## 2022-01-22 ENCOUNTER — Other Ambulatory Visit: Payer: PPO

## 2022-01-31 ENCOUNTER — Encounter: Payer: Self-pay | Admitting: Neurology

## 2022-01-31 ENCOUNTER — Ambulatory Visit (INDEPENDENT_AMBULATORY_CARE_PROVIDER_SITE_OTHER): Payer: PPO | Admitting: Neurology

## 2022-01-31 ENCOUNTER — Telehealth: Payer: Self-pay | Admitting: Neurology

## 2022-01-31 VITALS — BP 134/81 | HR 82 | Ht 69.0 in | Wt 189.0 lb

## 2022-01-31 DIAGNOSIS — R202 Paresthesia of skin: Secondary | ICD-10-CM | POA: Diagnosis not present

## 2022-01-31 DIAGNOSIS — R4189 Other symptoms and signs involving cognitive functions and awareness: Secondary | ICD-10-CM | POA: Diagnosis not present

## 2022-01-31 DIAGNOSIS — G7000 Myasthenia gravis without (acute) exacerbation: Secondary | ICD-10-CM | POA: Insufficient documentation

## 2022-01-31 DIAGNOSIS — H532 Diplopia: Secondary | ICD-10-CM | POA: Diagnosis not present

## 2022-01-31 MED ORDER — PYRIDOSTIGMINE BROMIDE 60 MG PO TABS: 60.0000 mg | ORAL_TABLET | Freq: Three times a day (TID) | ORAL | 5 refills | Status: DC

## 2022-01-31 MED ORDER — PREDNISONE 10 MG PO TABS: 20.0000 mg | ORAL_TABLET | Freq: Every day | ORAL | 5 refills | Status: DC

## 2022-01-31 MED ORDER — PREDNISONE 5 MG PO TABS: 20.0000 mg | ORAL_TABLET | Freq: Every day | ORAL | 3 refills | Status: DC

## 2022-01-31 NOTE — Telephone Encounter (Signed)
healthteam adv NPR sent to GI [phone]

## 2022-01-31 NOTE — Progress Notes (Signed)
Chief Complaint  Patient presents with   New Patient (Initial Visit)    Rm 14.Wife is with patient and states he gets a "weird/numb feeling" in his head and has also had problems with his gait since the eye problems began. Patient has had cataract surgery in 2023 and dr stated she had trouble getting his right eye to dialate. Dr also said there was a spot at the back of his brain that she saw on recent imaging.  Referred to [place] by eye doctor.       ASSESSMENT AND PLAN  Steve Thompson is a 89 y.o. 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 Laboratory evaluations from last home January 02, 2022, normal thyroid peroxidase, TSH was mildly elevated 4.91, normal T3 86, C-reactive protein 1.6, positive acetylcholine receptor binding antibody 7.27, negative protein, moderate antibody, normal CBC, hemoglobin of 15.9,  MEDICAL HISTORY:  Steve Thompson, is a 89 year old male, accompanied by his wife, seen in request by his ophthalmologist Dr. Luberta Mutter for evaluation of double vision, his primary care physician is [facility] physician Dr. Harrington Challenger, Dwyane Luo, for evaluation was on January 31, 2022  I reviewed and  summarized the referring note. PMHX. HLD Hypothyrodism Parathyroidectomy in 2010.  He had right cataract surgery in September 2023 for complaints of worsening drowsiness, since surgery, he complains of continued blurriness, also noticed intermittent double vision, droopy eyelid, he has to close 1 eye to make the picture sharp,  Patient denies bulbar weakness, denies difficulty chewing, swallowing, no significant muscle weakness, already has baseline mild unsteady gait, wife also reported over the past couple years, he has noticeable slow worsening memory loss  He is a retired Theatre manager for [employer], MoCA examination 19/30 today  He was seen by ophthalmologist Dr. Ellie Lunch, ordered laboratory evaluation which demonstrated positive acetylcholine receptor binding antibody 7.27  I also personally reviewed MRI of the brain and orbits with and without contrast, no acute intracranial abnormality, mild generalized atrophy small vessel disease,  PHYSICAL EXAM:   Vitals:   01/31/22 1456  BP: 134/81  Pulse: 82  Weight: 189 lb (85.7 kg)  Height: '5\' 9"'$  (1.753 m)   Not recorded     Body mass index is 27.91 kg/m.  PHYSICAL EXAMNIATION:  Gen: NAD, conversant, well nourised, well groomed                     Cardiovascular: Regular rate rhythm, no peripheral edema, warm, nontender. Eyes: Conjunctivae clear without exudates or hemorrhage Neck: Supple, no carotid bruits. Pulmonary: Clear to auscultation bilaterally   NEUROLOGICAL EXAM:  MENTAL STATUS: Speech/cognition: Awake, alert, oriented to history taking and casual conversation     01/31/2022    3:00 PM  Montreal Cognitive Assessment  Visuospatial/ Executive (0/5) 3  Naming (0/3) 2  Attention: Read list of digits (0/2) 2  Attention: Read list of letters (0/1) 1  Attention: Serial 7 subtraction starting at 100 (0/3) 3  Language: Repeat phrase (0/2) 2  Language : Fluency (0/1) 0  Abstraction (0/2) 1  Delayed Recall  (0/5) 0  Orientation (0/6) 5  Total 19    CRANIAL NERVES: CN II: Visual fields are full to confrontation. Pupils are round equal and briskly reactive to light. CN III, IV, VI: mild left fatigable ptosis, right superior exotropia, left inferior/exotropia CN V: Facial sensation is intact to light touch CN VII: Face is symmetric with normal eye closure  CN VIII: Hearing is normal to causal conversation. CN IX, X: Phonation is normal. CN XI: Head turning and shoulder shrug are intact  MOTOR: Slight neck flexion, shoulder abduction hip flexion weakness  REFLEXES: Reflexes are 2+ and symmetric at the biceps, triceps, knees, and ankles. Plantar responses are flexor.  SENSORY: Intact to light touch, pinprick and vibratory sensation are intact in fingers and toes.  COORDINATION: There is no trunk or limb dysmetria noted.  GAIT/STANCE: Posture is normal. Gait is steady with normal steps, base, arm swing, and turning. Heel and toe walking are normal. Tandem gait is normal.  Romberg is absent.  REVIEW OF SYSTEMS:  Full 14 system review of systems performed and notable only for as above All other review of systems were negative.   ALLERGIES: No Known Allergies  HOME MEDICATIONS: Current Outpatient Medications  Medication Sig Dispense Refill   aspirin 81 MG tablet Take 81 mg by mouth daily. Patient also taking a medication for cholesterol, but does not remember name or dosage. Will provide at next visit.      gabapentin (NEURONTIN) 300 MG capsule Take 300 mg by mouth 3 (three) times daily.       simvastatin (ZOCOR) 40 MG tablet      SYNTHROID 88 MCG tablet Take 1 tablet (88 mcg total) by mouth daily. (Patient taking differently: Take 100 mcg by mouth daily. ) 30 tablet 6   No current facility-administered medications for this visit.    PAST MEDICAL HISTORY: Past Medical History:  Diagnosis Date   Arthritis    Hemorrhoids    Hypercalcemia    Hyperthyroidism    Peripheral  neuropathy     PAST SURGICAL HISTORY: Past Surgical History:  Procedure Laterality Date   HEMORRHOID SURGERY     PARATHYROIDECTOMY  10/13/10    FAMILY HISTORY: Family History  Problem Relation Age of Onset   Cancer Sister        lung    SOCIAL HISTORY: Social History   Socioeconomic History   Marital status: Married    Spouse name: Not on file   Number of children: Not on file   Years of education: Not on file   Highest education level: Not on file  Occupational History   Not on file  Tobacco Use   Smoking status: Former   Smokeless tobacco: Never  Substance and Sexual Activity   Alcohol use: No   Drug use: No   Sexual activity: Not on file  Other Topics Concern   Not on file  Social History Narrative   Not on file   Social Determinants of Health   Financial Resource Strain: Not on file  Food Insecurity: Not on file  Transportation Needs: Not on file  Physical Activity: Not on file  Stress: Not on file  Social Connections: Not  on file  Intimate Partner Violence: Not on file      Marcial Pacas, M.D. Ph.D.  [facility] [address] [place], [place] [address] Ph: [phone] Fax: [phone]  CC:  Luberta Mutter, MD [address] [place],  [place] [address]  Lawerance Cruel, MD

## 2022-01-31 NOTE — Patient Instructions (Addendum)
Meds ordered this encounter  Medications   pyridostigmine (MESTINON) 60 MG tablet---take 1/2 to one tab as needed for double vision and droopy eye lid, maximum 3 tabs in 24 hours.    Prednisone 5 mg tabs. 4 tabs after breakfast everyday x 2 weeks. 3 tabs daily after breakfast everyday x 2 weeks. 2 tabs daily after breakfast everyday x 2 weeks.

## 2022-02-01 ENCOUNTER — Telehealth: Payer: Self-pay | Admitting: Neurology

## 2022-02-01 NOTE — Telephone Encounter (Signed)
Called pt. Informed pt that medication was confirmed with the pharmacy. Pt said Okay.

## 2022-02-01 NOTE — Telephone Encounter (Signed)
    Both medications ordered yesterday have been confirmed by pharmacy, please call back and update him to reach out to pharmacy.

## 2022-02-01 NOTE — Telephone Encounter (Signed)
Pt reports that there are 2 medications he was told would be called into [id number].  He has checked and nothing has been called in.  Please update pt

## 2022-02-02 ENCOUNTER — Other Ambulatory Visit: Payer: Self-pay

## 2022-02-02 MED ORDER — PREDNISONE 5 MG PO TABS: 20.0000 mg | ORAL_TABLET | Freq: Every day | ORAL | 3 refills | Status: DC

## 2022-02-02 MED ORDER — PYRIDOSTIGMINE BROMIDE 60 MG PO TABS: 60.0000 mg | ORAL_TABLET | Freq: Three times a day (TID) | ORAL | 5 refills | Status: DC

## 2022-02-02 NOTE — Telephone Encounter (Signed)
Fixed pharmacy, and Called and left a VM per dpr to tell patient they were sent.

## 2022-02-02 NOTE — Telephone Encounter (Signed)
Pt called and states that his medication was called in to the wrong pharmacy and would like to have this corrected and [facility]. Pt pharmacy information was updated and pt is wanting a call back when the Rx's are called in for him correctly.

## 2022-02-06 LAB — COMPREHENSIVE METABOLIC PANEL
ALT: 16 IU/L (ref 0–44)
AST: 19 IU/L (ref 0–40)
Albumin/Globulin Ratio: 2.3 — ABNORMAL HIGH (ref 1.2–2.2)
Albumin: 4.8 g/dL — ABNORMAL HIGH (ref 3.7–4.7)
Alkaline Phosphatase: 70 IU/L (ref 44–121)
BUN/Creatinine Ratio: 17 (ref 10–24)
BUN: 18 mg/dL (ref 8–27)
Bilirubin Total: 0.6 mg/dL (ref 0.0–1.2)
CO2: 26 mmol/L (ref 20–29)
Calcium: 10.5 mg/dL — ABNORMAL HIGH (ref 8.6–10.2)
Chloride: 100 mmol/L (ref 96–106)
Creatinine, Ser: 1.09 mg/dL (ref 0.76–1.27)
Globulin, Total: 2.1 g/dL (ref 1.5–4.5)
Glucose: 94 mg/dL (ref 70–99)
Potassium: 4.4 mmol/L (ref 3.5–5.2)
Sodium: 141 mmol/L (ref 134–144)
Total Protein: 6.9 g/dL (ref 6.0–8.5)
eGFR: 65 mL/min/{1.73_m2} (ref >59)

## 2022-02-06 LAB — ACHR ALL WITH REFLEX TO MUSK
AChR Binding Ab, Serum: 7.4 nmol/L — ABNORMAL HIGH (ref 0.00–0.24)
AChR-modulating Ab: 63 % — ABNORMAL HIGH (ref 0–45)
Acetylchol Block Ab: 40 % — ABNORMAL HIGH (ref 0–25)

## 2022-02-06 LAB — HGB A1C W/O EAG: Hgb A1c MFr Bld: 5.8 % — ABNORMAL HIGH (ref 4.8–5.6)

## 2022-02-06 LAB — RPR: RPR Ser Ql: NONREACTIVE

## 2022-02-06 LAB — VITAMIN B12: Vitamin B-12: 528 pg/mL (ref 232–1245)

## 2022-02-06 LAB — TSH: TSH: 4.01 u[IU]/mL (ref 0.450–4.500)

## 2022-03-03 ENCOUNTER — Ambulatory Visit
Admission: RE | Admit: 2022-03-03 | Discharge: 2022-03-03 | Disposition: A | Discharge status: Home / Self Care | Payer: PPO | Source: Ambulatory Visit | Attending: Neurology | Admitting: Neurology

## 2022-03-03 DIAGNOSIS — R4189 Other symptoms and signs involving cognitive functions and awareness: Secondary | ICD-10-CM

## 2022-03-03 DIAGNOSIS — J9811 Atelectasis: Secondary | ICD-10-CM | POA: Diagnosis not present

## 2022-03-03 DIAGNOSIS — R911 Solitary pulmonary nodule: Secondary | ICD-10-CM | POA: Diagnosis not present

## 2022-03-03 DIAGNOSIS — G7000 Myasthenia gravis without (acute) exacerbation: Secondary | ICD-10-CM

## 2022-03-03 DIAGNOSIS — H532 Diplopia: Secondary | ICD-10-CM

## 2022-03-03 DIAGNOSIS — R202 Paresthesia of skin: Secondary | ICD-10-CM

## 2022-03-07 ENCOUNTER — Telehealth: Payer: Self-pay | Admitting: Neurology

## 2022-03-07 NOTE — Telephone Encounter (Signed)
Please call patient, CT of the chest showed no significant thymus pathology, there is evidence of chronic compression fracture of T12, L1  Also findings of scattered groundglass opacity at the long, most noticeable was at left lower lobe, measuring up to 2.0 to 2.4 cm, radiology have suggested repeat CT chest in 3 to 6 months  I have forwarded result to his primary care Lawerance Cruel, MD, please contact Dr. Harle Battiest to have repeat CT chest after May 2024   Lungs/Pleura: Scattered foci of ground-glass opacity are identified. One of the largest is in the left lower lobe measuring 2.0 cm on image 70, series 5. A second prominent ground-glass opacity is seen in the left lower lobe on image 68, series 5 and measures 2.4 cm. There is some dependent atelectasis in the lung bases. No pleural effusion. Airways are unremarkable.   Upper Abdomen: Negative.   Musculoskeletal: No lytic or sclerotic lesion is identified. There is a superior endplate compression fracture T12 with vertebral body height loss of approximately 50% and mild superior endplate compression fracture of L2. These fractures appear remote.   IMPRESSION: Scattered ground-glass opacities in the lungs as described. Non-contrast chest CT at 3-6 months is recommended. If nodules persist, subsequent management will be based upon the most suspicious nodule(s). This recommendation follows the consensus statement: Guidelines for Management of Incidental Pulmonary Nodules Detected on CT Images: From the Fleischner Society 2017; Radiology 2017; 284:228-243.   Compression fractures of T12 and L1 cannot be definitively characterized but appear remote.

## 2022-03-07 NOTE — Telephone Encounter (Signed)
Attempted to call patient. Someone answered and immediately hung up

## 2022-03-09 ENCOUNTER — Ambulatory Visit: Payer: PPO | Admitting: Neurology

## 2022-03-13 NOTE — Telephone Encounter (Signed)
I attempted to contact the patient and was unable to reach him. I have printed the results and placed them in the outgoing mail.

## 2022-03-20 ENCOUNTER — Telehealth: Payer: Self-pay | Admitting: Neurology

## 2022-03-20 NOTE — Telephone Encounter (Addendum)
Pt called and requested a refill on predniSONE (DELTASONE) 5 MG tablet. [id number]  Pt is requesting a call back from nurse to see if he needs to still take medication.

## 2022-05-03 ENCOUNTER — Ambulatory Visit (INDEPENDENT_AMBULATORY_CARE_PROVIDER_SITE_OTHER): Payer: PPO | Admitting: Neurology

## 2022-05-03 ENCOUNTER — Encounter: Payer: Self-pay | Admitting: Neurology

## 2022-05-03 VITALS — BP 118/67 | HR 77 | Ht 69.0 in | Wt 189.0 lb

## 2022-05-03 DIAGNOSIS — R202 Paresthesia of skin: Secondary | ICD-10-CM | POA: Diagnosis not present

## 2022-05-03 DIAGNOSIS — G7000 Myasthenia gravis without (acute) exacerbation: Secondary | ICD-10-CM | POA: Diagnosis not present

## 2022-05-03 DIAGNOSIS — H532 Diplopia: Secondary | ICD-10-CM

## 2022-05-03 DIAGNOSIS — R4189 Other symptoms and signs involving cognitive functions and awareness: Secondary | ICD-10-CM

## 2022-05-03 NOTE — Progress Notes (Signed)
Chief Complaint  Patient presents with   Procedure    Rm EMG/NCV 3. Accompanied by wife.      ASSESSMENT AND PLAN  Steve Thompson is a 89 y.o. male  Seropositive myasthenia gravis  Mainly ocular symptoms, fatigable ptosis left worse than right, disconjugate extraocular movement  Confirmed by positive acetylcholine binding antibody December 2023, 7.27  CT chest showed no significant thymus pathology  Mestinon 60 mg half to 1 tablet as needed, maximum 3 tablets in 24 hours  Prednisone tapering dose was started in June annually, intended to taper down 10 mg daily, he has used up the supply, stopped the prednisone few weeks ago, no recurrent symptoms  Today's examination showed no significant bulbar or limb muscle weakness, will hold immunosuppressive treatment, Mestinon as needed only  Dementia  MRI of the brain December 2023 mild age-related changes, small vessel disease no acute abnormalities  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 Laboratory evaluations from last home January 02, 2022, normal thyroid peroxidase, TSH was mildly elevated 4.91, normal T3 86, C-reactive protein 1.6, positive acetylcholine receptor binding antibody 7.27, negative protein, moderate antibody, normal CBC, hemoglobin of 15.9,  MEDICAL HISTORY:  Steve Thompson, is a 89 year old male, accompanied by his wife, seen in request by his ophthalmologist Dr. Maris Berger for evaluation of double vision, his primary care physician is [facility] physician Dr. Tenny Craw, Darlen Round, for evaluation was on January 31, 2022  I  reviewed and summarized the referring note. PMHX. HLD Hypothyrodism Parathyroidectomy in 2010.  He had right cataract surgery in September 2023 for complaints of worsening drowsiness, since surgery, he complains of continued blurriness, also noticed intermittent double vision, droopy eyelid, he has to close 1 eye to make the picture sharp,  Patient denies bulbar weakness, denies difficulty chewing, swallowing, no significant muscle weakness, already has baseline mild unsteady gait, wife also reported over the past couple years, he has noticeable slow worsening memory loss  He is a retired Production designer, theatre/television/film for [employer], MoCA examination 19/30 today  He was seen by ophthalmologist Dr. Charlotte Sanes, ordered laboratory evaluation which demonstrated positive acetylcholine receptor binding antibody 7.27  I also personally reviewed MRI of the brain and orbits with and without contrast, no acute intracranial abnormality, mild generalized atrophy small vessel disease,  UPDATE May 03 2022: He is with his wife at today's clinical visit, overall stable, has used up all his prednisone supply, no longer taking it, did not notice flareup of his muscle weakness, diplopia, last dose of Mestinon was 2 days ago, continue have memory loss, but denied chewing difficulty, mild unsteady gait which is at his baseline  Today's examination showed no significant bulbar, extraocular muscle, or limb muscle weakness.  Laboratory evaluations, normal CMP with exception of slight elevation of calcium 10.5, A1c 5.8, normal TSH, negative RPR, normal B12 528, positive acetylcholine binding antibody 7.4, blocking antibody 40, modulating antibody 63 all elevated,  PHYSICAL EXAM:   Vitals:   05/03/22 0922  BP: 118/67  Pulse: 77  Weight: 189 lb (85.7 kg)  Height:  (1.753 m)   Body mass index is 27.91 kg/m.  PHYSICAL EXAMNIATION:  Gen: NAD, conversant,  well nourised, well groomed                      Cardiovascular: Regular rate rhythm, no peripheral edema, warm, nontender. Eyes: Conjunctivae clear without exudates or hemorrhage Neck: Supple, no carotid bruits. Pulmonary: Clear to auscultation bilaterally   NEUROLOGICAL EXAM:  MENTAL STATUS: Speech/cognition: Awake, alert, oriented to history taking and casual conversation     05/03/2022    9:53 AM 01/31/2022    3:00 PM  Montreal Cognitive Assessment   Visuospatial/ Executive (0/5) 3 3  Naming (0/3) 1 2  Attention: Read list of digits (0/2) 2 2  Attention: Read list of letters (0/1) 1 1  Attention: Serial 7 subtraction starting at 100 (0/3) 3 3  Language: Repeat phrase (0/2) 2 2  Language : Fluency (0/1) 0 0  Abstraction (0/2) 1 1  Delayed Recall (0/5) 0 0  Orientation (0/6) 6 5  Total 19 19    CRANIAL NERVES: CN II: Visual fields are full to confrontation. Pupils are round equal and briskly reactive to light. CN III, IV, VI: mild left fatigable ptosis, mild bilateral exophoria on cover and uncover test. CN V: Facial sensation is intact to light touch CN VII: Face is symmetric with normal eye closure  CN VIII: Hearing is normal to causal conversation. CN IX, X: Phonation is normal. CN XI: Head turning and shoulder shrug are intact  MOTOR: No significant neck flexion, upper and lower extremity weakness  REFLEXES: Reflexes are 1 and symmetric at the biceps, triceps, knees, and ankles. Plantar responses are flexor.  SENSORY: Intact to light touch, pinprick and vibratory sensation are intact in fingers and toes.  COORDINATION: There is no trunk or limb dysmetria noted.  GAIT/STANCE: Need push-up to get up from seated position, cautious,  REVIEW OF SYSTEMS:  Full 14 system review of systems performed and notable only for as above All other review of systems were negative.   ALLERGIES: No Known Allergies  HOME MEDICATIONS: Current Outpatient Medications  Medication Sig Dispense Refill   aspirin 81 MG tablet  Take 81 mg by mouth daily. Patient also taking a medication for cholesterol, but does not remember name or dosage. Will provide at next visit.      gabapentin (NEURONTIN) 300 MG capsule Take 300 mg by mouth 4 (four) times daily.     pyridostigmine (MESTINON) 60 MG tablet Take 1 tablet (60 mg total) by mouth 3 (three) times daily. 90 tablet 5   SYNTHROID 88 MCG tablet Take 1 tablet (88 mcg total) by mouth daily. (Patient taking differently: Take 100 mcg by mouth daily.) 30 tablet 6   predniSONE (DELTASONE) 5 MG tablet Take 4 tablets (20 mg total) by mouth daily with breakfast. (Patient not taking: Reported on 05/03/2022) 90 tablet 3   simvastatin (ZOCOR) 40 MG tablet  (Patient not taking: Reported on 05/03/2022)     No current facility-administered medications for this visit.    PAST MEDICAL HISTORY: Past Medical History:  Diagnosis Date   Arthritis    Hemorrhoids    Hypercalcemia    Hyperthyroidism    Peripheral neuropathy     PAST SURGICAL HISTORY: Past Surgical History:  Procedure Laterality Date   HEMORRHOID SURGERY     PARATHYROIDECTOMY  10/13/10    FAMILY HISTORY: Family History  Problem Relation Age of Onset   Cancer Sister        lung    SOCIAL HISTORY: Social History   Socioeconomic History  Marital status: Married    Spouse name: Not on file   Number of children: Not on file   Years of education: Not on file   Highest education level: Not on file  Occupational History   Not on file  Tobacco Use   Smoking status: Former   Smokeless tobacco: Never  Substance and Sexual Activity   Alcohol use: No   Drug use: No   Sexual activity: Not on file  Other Topics Concern   Not on file  Social History Narrative   Not on file   Social Determinants of Health   Financial Resource Strain: Not on file  Food Insecurity: Not on file  Transportation Needs: Not on file  Physical Activity: Not on file  Stress: Not on file  Social Connections: Not on file  Intimate  Partner Violence: Not on file      Levert Feinstein, M.D. Ph.D.  [facility] [address] [place], [place] [address] Ph: [phone] Fax: [phone]  CC:  [name], MD [address] [place],  [place] [address]  [name], MD

## 2022-07-31 ENCOUNTER — Telehealth: Payer: Self-pay | Admitting: Neurology

## 2022-07-31 NOTE — Progress Notes (Unsigned)
No chief complaint on file.     ASSESSMENT AND PLAN  Steve Thompson is a 89 y.o. male  Seropositive myasthenia gravis  Mainly ocular symptoms, fatigable ptosis left worse than right, disconjugate extraocular movement  Confirmed by positive acetylcholine binding antibody December 2023, 7.27  CT chest to rule out thymus pathology  Mestinon 60 mg half to 1 tablets as needed, maximum 3 tablets in 24 hours  Prednisone starting from 20 mg daily, 5 mg decrement every 2 weeks, maintain at 10 mg daily  Prednisone tapering dose was started in June annually, intended to taper down 10 mg daily, he has used up the supply, stopped the prednisone few weeks ago, no recurrent symptoms             Today's examination showed no significant bulbar or limb muscle weakness, will hold immunosuppressive treatment, Mestinon as needed only    Cognitive impairment  MRI of the brain December 2023 mild age-related changes, small vessel disease no acute abnormalities  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 Laboratory evaluations from last home January 02, 2022, normal thyroid peroxidase, TSH was mildly elevated 4.91, normal T3 86, C-reactive protein 1.6, positive acetylcholine receptor binding antibody 7.27, negative protein, moderate antibody, normal CBC, hemoglobin of 15.9,  MEDICAL HISTORY:  Update 08/01/2022 JM:      UPDATE May 03 2022 Dr Terrace Arabia: He is with his wife at today's clinical visit, overall stable, has used up all his prednisone supply, no longer taking it, did not notice flareup of his muscle weakness, diplopia, last dose of Mestinon was 2 days ago, continue have memory loss, but denied chewing difficulty, mild unsteady gait which is at his baseline   Today's examination  showed no significant bulbar, extraocular muscle, or limb muscle weakness.   Laboratory evaluations, normal CMP with exception of slight elevation of calcium 10.5, A1c 5.8, normal TSH, negative RPR, normal B12 528, positive acetylcholine binding antibody 7.4, blocking antibody 40, modulating antibody 63 all elevated,   Consult visit 01/31/2022 Dr. Terrace Arabia: Steve Thompson, is a 89 year old male, accompanied by his wife, seen in request by his ophthalmologist Dr. Maris Berger for evaluation of double vision, his primary care physician is [facility] physician Dr. Tenny Craw, Darlen Round, for evaluation was on January 31, 2022  I reviewed and summarized the referring note. PMHX. HLD Hypothyrodism Parathyroidectomy in 2010.  He had right cataract surgery in September 2023 for complaints of worsening drowsiness, since surgery, he complains of continued blurriness, also noticed intermittent double vision, droopy eyelid, he has to close 1 eye to make the picture sharp,  Patient denies bulbar weakness, denies difficulty chewing, swallowing, no significant muscle weakness, already has baseline mild unsteady gait, wife also reported over the past couple years, he has noticeable slow worsening memory loss  He is a retired Production designer, theatre/television/film for [employer], MoCA examination 19/30 today  He was seen by ophthalmologist Dr. Charlotte Sanes, ordered laboratory evaluation which demonstrated positive acetylcholine receptor binding antibody 7.27  I also personally reviewed MRI of the brain and orbits with and without contrast, no acute intracranial abnormality, mild generalized atrophy small vessel disease,  PHYSICAL EXAM:   There were no vitals filed for this visit.  Not recorded     There is no height or weight on file to calculate BMI.  PHYSICAL EXAMNIATION:  Gen: NAD, conversant, well nourised, well groomed                     Cardiovascular: Regular rate rhythm, no peripheral edema, warm, nontender. Eyes:  Conjunctivae clear without exudates or hemorrhage Neck: Supple, no carotid bruits. Pulmonary: Clear to auscultation bilaterally   NEUROLOGICAL EXAM:  MENTAL STATUS: Speech/cognition: Awake, alert, oriented to history taking and casual conversation     05/03/2022    9:53 AM 01/31/2022    3:00 PM  Montreal Cognitive Assessment   Visuospatial/ Executive (0/5) 3 3  Naming (0/3) 1 2  Attention: Read list of digits (0/2) 2 2  Attention: Read list of letters (0/1) 1 1  Attention: Serial 7 subtraction starting at 100 (0/3) 3 3  Language: Repeat phrase (0/2) 2 2  Language : Fluency (0/1) 0 0  Abstraction (0/2) 1 1  Delayed Recall (0/5) 0 0  Orientation (0/6) 6 5  Total 19 19    CRANIAL NERVES: CN II: Visual fields are full to confrontation. Pupils are round equal and briskly reactive to light. CN III, IV, VI: mild left fatigable ptosis, right superior exotropia, left inferior/exotropia CN V: Facial sensation is intact to light touch CN VII: Face is symmetric with normal eye closure  CN VIII: Hearing is normal to causal conversation. CN IX, X: Phonation is normal. CN XI: Head turning and shoulder shrug are intact  MOTOR: Slight neck flexion, shoulder abduction hip flexion weakness  REFLEXES: Reflexes are 2+ and symmetric at the biceps, triceps, knees, and ankles. Plantar responses are flexor.  SENSORY: Intact to light touch, pinprick and vibratory sensation are intact in fingers and toes.  COORDINATION: There is no trunk or limb dysmetria noted.  GAIT/STANCE: Posture is normal. Gait is steady with normal steps, base, arm swing, and turning. Heel and toe walking are normal. Tandem gait is normal.  Romberg is absent.  REVIEW OF SYSTEMS:  Full 14 system review of systems performed and notable only for as above All other review of systems were negative.   ALLERGIES: No Known Allergies  HOME MEDICATIONS: Current Outpatient Medications  Medication Sig Dispense Refill    aspirin 81 MG tablet Take 81 mg by mouth daily. Patient also taking a medication for cholesterol, but does not remember name or dosage. Will provide at next visit.      gabapentin (NEURONTIN) 300 MG capsule Take 300 mg by mouth 4 (four) times daily.     predniSONE (DELTASONE) 5 MG tablet Take 4 tablets (20 mg total) by mouth daily with breakfast. (Patient not taking: Reported on 05/03/2022) 90 tablet 3   pyridostigmine (MESTINON) 60 MG tablet Take 1 tablet (60 mg total) by mouth 3 (three) times daily. 90 tablet 5   simvastatin (ZOCOR) 40 MG tablet  (Patient not taking: Reported on 05/03/2022)     SYNTHROID 88 MCG tablet Take 1 tablet (88 mcg total) by mouth daily. (Patient taking differently: Take 100 mcg by mouth daily.) 30 tablet 6   No current facility-administered medications for this visit.    PAST MEDICAL HISTORY: Past Medical History:  Diagnosis Date   Arthritis    Hemorrhoids    Hypercalcemia    Hyperthyroidism    Peripheral neuropathy     PAST SURGICAL HISTORY: Past Surgical History:  Procedure Laterality Date   HEMORRHOID SURGERY     PARATHYROIDECTOMY  10/13/10    FAMILY HISTORY: Family History  Problem Relation Age of Onset   Cancer Sister  lung    SOCIAL HISTORY: Social History   Socioeconomic History   Marital status: Married    Spouse name: Not on file   Number of children: Not on file   Years of education: Not on file   Highest education level: Not on file  Occupational History   Not on file  Tobacco Use   Smoking status: Former   Smokeless tobacco: Never  Substance and Sexual Activity   Alcohol use: No   Drug use: No   Sexual activity: Not on file  Other Topics Concern   Not on file  Social History Narrative   Not on file   Social Determinants of Health   Financial Resource Strain: Not on file  Food Insecurity: Not on file  Transportation Needs: Not on file  Physical Activity: Not on file  Stress: Not on file  Social Connections: Not  on file  Intimate Partner Violence: Not on file      I spent *** minutes of face-to-face and non-face-to-face time with patient.  This included previsit chart review, lab review, study review, order entry, electronic health record documentation, patient education and discussion regarding above diagnoses and treatment plan and answered all other questions to patient's satisfaction  Ihor Austin, [facility]  [facility] [address] [place], [place] [address]  Phone [phone] Fax [phone] Note: This document was prepared with digital dictation and possible smart phrase technology. Any transcriptional errors that result from this process are unintentional.

## 2022-08-01 ENCOUNTER — Ambulatory Visit: Payer: PPO | Admitting: Adult Health

## 2022-08-01 ENCOUNTER — Encounter: Payer: Self-pay | Admitting: Adult Health

## 2022-08-01 VITALS — BP 116/64 | HR 80 | Ht 71.0 in | Wt 188.0 lb

## 2022-08-01 DIAGNOSIS — H532 Diplopia: Secondary | ICD-10-CM | POA: Diagnosis not present

## 2022-08-01 DIAGNOSIS — H01002 Unspecified blepharitis right lower eyelid: Secondary | ICD-10-CM | POA: Diagnosis not present

## 2022-08-01 DIAGNOSIS — G7000 Myasthenia gravis without (acute) exacerbation: Secondary | ICD-10-CM | POA: Diagnosis not present

## 2022-08-01 DIAGNOSIS — H01005 Unspecified blepharitis left lower eyelid: Secondary | ICD-10-CM | POA: Diagnosis not present

## 2022-08-01 DIAGNOSIS — R2689 Other abnormalities of gait and mobility: Secondary | ICD-10-CM

## 2022-08-01 DIAGNOSIS — H02403 Unspecified ptosis of bilateral eyelids: Secondary | ICD-10-CM | POA: Diagnosis not present

## 2022-08-01 MED ORDER — PREDNISONE 10 MG PO TABS: 20.0000 mg | ORAL_TABLET | Freq: Every day | ORAL | 5 refills | Status: DC

## 2022-08-01 NOTE — Patient Instructions (Addendum)
Your Plan:  Restart prednisone 20mg  daily and decrease by 5mg  every 2 weeks, and then maintain at 10mg  daily for now  Will discuss further treatment plan with Dr. Terrace Arabia tomorrow and will keep you updated  Will place order to physical therapy to help with balance - you will be called to schedule    Follow up as scheduled in October      Thank you for coming to see [place] at [facility]. I hope we have been able to provide you high quality care today.  You may receive a patient satisfaction survey over the next few weeks. We would appreciate your feedback and comments so that we may continue to improve ourselves and the health of our patients.

## 2022-08-02 ENCOUNTER — Ambulatory Visit: Payer: PPO | Admitting: Rehabilitative and Restorative Service Providers"

## 2022-08-02 NOTE — Progress Notes (Signed)
Chart reviewed, restart prednisone 20 mg daily for diplopia, Mestinon made his symptoms worse,  Please check on patient in early August for his response to prednisone treatment, if no respond, we will put him on my clinic as add-on  If he responds well, will continue with slow tapering of prednisone, try not to let him be on prednisone too long for concerning of side effect,

## 2022-08-03 ENCOUNTER — Telehealth: Payer: Self-pay | Admitting: Adult Health

## 2022-08-03 NOTE — Telephone Encounter (Signed)
Please advise patient of Dr. Zannie Cove recommendations regarding ocular myasthenia gravis and recurrence of diplopia.  Okay to continue prednisone 20 mg daily and gradually taper as advised during his prior visit. We will check back in with him in a couple weeks to see how he is doing. If not doing any better, will be placed on Dr. Zannie Cove schedule for follow up as add-on. If he responds well, will continue slow tapering of prednisone.

## 2022-08-07 NOTE — Telephone Encounter (Signed)
Called pt and relayed the following information. Per Shanda Bumps, okay to continue prednisone 20 mg daily and gradually taper as advised during his prior visit.  Take prednisone 20mg  daily and decrease by 5mg  every 2 weeks, and then maintain at 10mg  daily for now.  Advised pt to give [place] a call and keep [place] updated on how he is doing. Pt verbalized understanding. Pt had no additional questions at this time but was encouraged to call back if questions arise.

## 2022-08-21 NOTE — Telephone Encounter (Signed)
Patient in agreement to come Wednesday 8/7 at 10 am. He is aware to arrive at 9:45am.

## 2022-08-21 NOTE — Telephone Encounter (Signed)
Do you have further recommendations on tapering prednisone? Thank you

## 2022-08-21 NOTE — Telephone Encounter (Signed)
Please double book with me on Wed August 7th xeomin injection slot,.   It is Ok to keep Prednisone 10mg  daily till then

## 2022-08-21 NOTE — Telephone Encounter (Signed)
Called pt to inquire on how he is doing. Pt stated he is doing well and his vision is better, no more double vision. He stated he is on the first week of taking 15 mg of the prednisone, on 8/15 he will taper down to the 10 mg.

## 2022-08-21 NOTE — Telephone Encounter (Signed)
Please contact patient to see how he is doing on prednisone therapy. If no improvement, please schedule with Dr. Terrace Arabia for follow up (she requested being placed as add-on if needed). If doing better, can start to slowly taper prednisone dosage, will follow up with Dr. Terrace Arabia regarding dosage/taper recommendations.

## 2022-08-23 ENCOUNTER — Encounter: Payer: Self-pay | Admitting: Neurology

## 2022-08-23 ENCOUNTER — Ambulatory Visit: Payer: PPO | Admitting: Neurology

## 2022-08-23 VITALS — BP 133/62 | HR 73 | Ht 71.0 in | Wt 183.8 lb

## 2022-08-23 DIAGNOSIS — G7000 Myasthenia gravis without (acute) exacerbation: Secondary | ICD-10-CM | POA: Diagnosis not present

## 2022-08-23 DIAGNOSIS — H532 Diplopia: Secondary | ICD-10-CM | POA: Diagnosis not present

## 2022-08-23 MED ORDER — PREDNISONE 5 MG PO TABS: 5.0000 mg | ORAL_TABLET | Freq: Every day | ORAL | 6 refills | Status: DC

## 2022-08-23 MED ORDER — MYCOPHENOLATE MOFETIL 500 MG PO TABS: 1000.0000 mg | ORAL_TABLET | Freq: Two times a day (BID) | ORAL | 11 refills | Status: DC

## 2022-08-23 NOTE — Progress Notes (Signed)
Chief Complaint  Patient presents with   Myasthenia Gravis    Rm17, wife present (Steve Thompson) Mg: blurry and double vision started back in July  gait: not been as bad lately according to wife      ASSESSMENT AND PLAN  Steve Thompson is a 89 y.o. male  Seropositive myasthenia gravis  Mainly ocular symptoms,  Confirmed by positive acetylcholine binding antibody December 2023, 7.27  Weaned off prednisone back in April, about months later he had recurrent diplopia, improved after higher dose of prednisone, tolerating tapering, Will further taper down prednisone from 10-7.5 then to 5 mg daily, 2.5 milligrams decrements every 2 weeks Add-on CellCept 500 mg twice daily, continue Mestinon 30 mg 3 times daily as needed Baseline laboratory evaluation      DIAGNOSTIC DATA (LABS, IMAGING, TESTING) - I reviewed patient records, labs, notes, testing and imaging myself where available. Laboratory evaluations from last home January 02, 2022, normal thyroid peroxidase, TSH was mildly elevated 4.91, normal T3 86, C-reactive protein 1.6, positive acetylcholine receptor binding antibody 7.27, negative protein, moderate antibody, normal CBC, hemoglobin of 15.9,   MEDICAL HISTORY: Steve Thompson, is a 89 year old male, accompanied by his wife, seen in request by his ophthalmologist Dr. Maris Thompson for evaluation of double vision, his primary care physician is [facility] physician Dr. Tenny Thompson, Steve Thompson, for evaluation was on January 31, 2022  I reviewed and summarized the referring note. PMHX. HLD Hypothyrodism Parathyroidectomy in 2010.  He had right cataract surgery in September 2023 for complaints of worsening drowsiness, since surgery, he complains of continued blurriness, also noticed intermittent double vision, droopy eyelid, he has to close 1 eye to make the picture sharp,  Patient denies bulbar weakness, denies difficulty chewing, swallowing, no significant muscle weakness, already has  baseline mild unsteady gait, wife also reported over the past couple years, he has noticeable slow worsening memory loss  He is a retired Production designer, theatre/television/film for [employer], MoCA examination 19/30 today  He was seen by ophthalmologist Dr. Charlotte Thompson, ordered laboratory evaluation which demonstrated positive acetylcholine receptor binding antibody 7.27  I also personally reviewed MRI of the brain and orbits with and without contrast, no acute intracranial abnormality, mild generalized atrophy small vessel disease,   UPDATE May 03 2022 Dr Steve Thompson: He is with his wife at today's clinical visit, overall stable, has used up all his prednisone supply, no longer taking it, did not notice flareup of his muscle weakness, diplopia, last dose of Mestinon was 2 days ago, continue have memory loss, but denied chewing difficulty, mild unsteady gait which is at his baseline   Today's examination showed no significant bulbar, extraocular muscle, or limb muscle weakness.   Laboratory evaluations, normal CMP with exception of slight elevation of calcium 10.5, A1c 5.8, normal TSH, negative RPR, normal B12 528, positive acetylcholine binding antibody 7.4, blocking antibody 40, modulating antibody 63 all elevated,   UPDATE August 7th 2024: He had a recurrent double vision around June 2024, a month after he stopped taking prednisone, even taking Mestinon 30 to 60 mg 3 times a day without helping his double vision and blurry vision, complains of GI side effect with 60 mg  He was started on prednisone 20 mg daily tapering following his visit with Steve Thompson August 01, 2022, gradually noticed improvement, now on 10 mg daily, Mestinon 30 mg 3 times a day, no longer has visual issues  He denies bulbar limb muscle weakness,  PHYSICAL EXAM:   Vitals:   08/23/22 2440  BP: 133/62  Pulse: 73  Weight: 183 lb 12.8 oz (83.4 kg)  Height: 5\' 11"  (1.803 m)     Body mass index is 25.63 kg/m.  PHYSICAL EXAMNIATION:  Gen: NAD,  conversant, well nourised, well groomed                     Cardiovascular: Regular rate rhythm, no peripheral edema, warm, nontender. Eyes: Conjunctivae clear without exudates or hemorrhage Neck: Supple, no carotid bruits. Pulmonary: Clear to auscultation bilaterally   NEUROLOGICAL EXAM:  MENTAL STATUS: Speech/cognition: Awake, alert, oriented to history taking and casual conversation     05/03/2022    9:53 AM 01/31/2022    3:00 PM  Montreal Cognitive Assessment   Visuospatial/ Executive (0/5) 3 3  Naming (0/3) 1 2  Attention: Read list of digits (0/2) 2 2  Attention: Read list of letters (0/1) 1 1  Attention: Serial 7 subtraction starting at 100 (0/3) 3 3  Language: Repeat phrase (0/2) 2 2  Language : Fluency (0/1) 0 0  Abstraction (0/2) 1 1  Delayed Recall (0/5) 0 0  Orientation (0/6) 6 5  Total 19 19    CRANIAL NERVES: CN II: Visual fields are full to confrontation. Pupils are Thompson equal and briskly reactive to light. CN III, IV, VI: unable to appreciate eyelid ptosis, mild bilateral exophoria CN V: Facial sensation is intact to light touch CN VII: Face is symmetric with normal eye closure  CN VIII: Hearing is normal to causal conversation. CN IX, X: Phonation is normal. CN XI: Head turning and shoulder shrug are intact  MOTOR: No significant neck flexion, proximal upper and lower extremity muscle weakness,  REFLEXES: Reflexes are 2+ and symmetric at the biceps, triceps, knees, and ankles. Plantar responses are flexor.  SENSORY: Intact to light touch, pinprick and vibratory sensation are intact in fingers and toes.  COORDINATION: There is no trunk or limb dysmetria noted.  GAIT/STANCE: Posture is normal. Gait is steady with normal steps, base, arm swing, and turning. Heel and toe walking are normal. Tandem gait is normal.    REVIEW OF SYSTEMS:  Full 14 system review of systems performed and notable only for as above All other review of systems were  negative.   ALLERGIES: No Known Allergies  HOME MEDICATIONS: Current Outpatient Medications  Medication Sig Dispense Refill   aspirin 81 MG tablet Take 81 mg by mouth daily. Patient also taking a medication for cholesterol, but does not remember name or dosage. Will provide at next visit.      gabapentin (NEURONTIN) 300 MG capsule Take 300 mg by mouth 4 (four) times daily.     loratadine (CLARITIN) 10 MG tablet 1 tablet Orally Once a day     predniSONE (DELTASONE) 10 MG tablet Take 2 tablets (20 mg total) by mouth daily with breakfast. 60 tablet 5   pyridostigmine (MESTINON) 60 MG tablet Take 1 tablet (60 mg total) by mouth 3 (three) times daily. 90 tablet 5   SYNTHROID 88 MCG tablet Take 1 tablet (88 mcg total) by mouth daily. (Patient taking differently: Take 100 mcg by mouth daily.) 30 tablet 6   tamsulosin (FLOMAX) 0.4 MG CAPS capsule Take 0.4 mg by mouth daily.     No current facility-administered medications for this visit.    PAST MEDICAL HISTORY: Past Medical History:  Diagnosis Date   Arthritis    Hemorrhoids    Hypercalcemia    Hyperthyroidism    Peripheral neuropathy  PAST SURGICAL HISTORY: Past Surgical History:  Procedure Laterality Date   HEMORRHOID SURGERY     PARATHYROIDECTOMY  10/13/10    FAMILY HISTORY: Family History  Problem Relation Age of Onset   Cancer Sister        lung    SOCIAL HISTORY: Social History   Socioeconomic History   Marital status: Married    Spouse name: Not on file   Number of children: Not on file   Years of education: Not on file   Highest education level: Not on file  Occupational History   Not on file  Tobacco Use   Smoking status: Former   Smokeless tobacco: Never  Substance and Sexual Activity   Alcohol use: No   Drug use: No   Sexual activity: Not on file  Other Topics Concern   Not on file  Social History Narrative   Not on file   Social Determinants of Health   Financial Resource Strain: Not on file   Food Insecurity: Not on file  Transportation Needs: Not on file  Physical Activity: Not on file  Stress: Not on file  Social Connections: Not on file  Intimate Partner Violence: Not on file    Levert Feinstein, M.D. Ph.D.  [facility] [address] [place], [place] [address] Phone: [phone] Fax:      [phone]

## 2022-08-24 ENCOUNTER — Telehealth: Payer: Self-pay

## 2022-08-24 ENCOUNTER — Other Ambulatory Visit (HOSPITAL_COMMUNITY): Payer: Self-pay

## 2022-08-24 DIAGNOSIS — C44519 Basal cell carcinoma of skin of other part of trunk: Secondary | ICD-10-CM | POA: Diagnosis not present

## 2022-08-24 DIAGNOSIS — L821 Other seborrheic keratosis: Secondary | ICD-10-CM | POA: Diagnosis not present

## 2022-08-24 DIAGNOSIS — D1801 Hemangioma of skin and subcutaneous tissue: Secondary | ICD-10-CM | POA: Diagnosis not present

## 2022-08-24 DIAGNOSIS — G7000 Myasthenia gravis without (acute) exacerbation: Secondary | ICD-10-CM

## 2022-08-24 DIAGNOSIS — L570 Actinic keratosis: Secondary | ICD-10-CM | POA: Diagnosis not present

## 2022-08-24 DIAGNOSIS — Z85828 Personal history of other malignant neoplasm of skin: Secondary | ICD-10-CM | POA: Diagnosis not present

## 2022-08-24 DIAGNOSIS — L814 Other melanin hyperpigmentation: Secondary | ICD-10-CM | POA: Diagnosis not present

## 2022-08-24 DIAGNOSIS — C44319 Basal cell carcinoma of skin of other parts of face: Secondary | ICD-10-CM | POA: Diagnosis not present

## 2022-08-24 MED ORDER — MYCOPHENOLATE MOFETIL 500 MG PO TABS: 500.0000 mg | ORAL_TABLET | Freq: Two times a day (BID) | ORAL | 11 refills | Status: DC

## 2022-08-24 NOTE — Telephone Encounter (Signed)
Left msg for pt to call back to obtain result 

## 2022-08-24 NOTE — Telephone Encounter (Signed)
Can you clarify if PT is supposed to take 500MG  BID or 1000MG  BID-RX states Take 2 tablets (1,000 mg total) by mouth 2 (two) times daily. QTY of 60 but per chart note assessment and plan it states Add-on CellCept 500 mg twice daily. Thanks.

## 2022-08-24 NOTE — Telephone Encounter (Signed)
I fixed the Rx, should be cellcept 500mg  bid

## 2022-08-24 NOTE — Telephone Encounter (Signed)
-----   Message from Levert Feinstein sent at 08/24/2022 11:02 AM EDT ----- Please call patient, laboratory evaluation showed mild elevated A1c 6.1, indicating mildly elevated glucose level over the past few months.  Rest of the laboratory evaluation showed no significant abnormalities.  He would benefit diet control and exercise.

## 2022-08-24 NOTE — Addendum Note (Signed)
Addended by: Levert Feinstein on: 08/24/2022 01:07 PM   Modules accepted: Orders

## 2022-08-28 ENCOUNTER — Telehealth: Payer: Self-pay

## 2022-08-28 NOTE — Telephone Encounter (Signed)
Pa for cellcept needed

## 2022-08-28 NOTE — Telephone Encounter (Signed)
Pt called wanting to inform the provider that his insurance will not cover his mycophenolate (CELLCEPT) 500 MG tablet and is wanting to know if he can get a call from the RN when it has a PA

## 2022-08-29 NOTE — Telephone Encounter (Signed)
Phone room: Call and advise pt that pa team is working on it Thanks,  [employer]

## 2022-08-29 NOTE — Telephone Encounter (Signed)
Pharmacy Patient Advocate Encounter   Received notification from Physician's Office that prior authorization for CellCept is required/requested.   Insurance verification completed.   The patient is insured through [employer]/ [employer] .   Per test claim: PA required; PA submitted to [employer] via Fax Key/confirmation #/EOC N/A Status is pending  Faxed to [phone] along with clinicals.

## 2022-08-30 MED ORDER — AZATHIOPRINE 50 MG PO TABS: 50.0000 mg | ORAL_TABLET | Freq: Two times a day (BID) | ORAL | 11 refills | Status: DC

## 2022-08-30 NOTE — Telephone Encounter (Signed)
Pharmacy Patient Advocate Encounter  Received notification from [employer] that Prior Authorization for Mycophenolate Mofetil 500MG  tablets has been DENIED. Please advise how you'd like to proceed. Full denial letter will be uploaded to the media tab. See denial reason below.   PA #/Case ID/Reference #: N/A

## 2022-08-30 NOTE — Telephone Encounter (Signed)
Meds ordered this encounter  Medications   azaTHIOprine (IMURAN) 50 MG tablet    Sig: Take 1 tablet (50 mg total) by mouth 2 (two) times daily.    Dispense:  60 tablet    Refill:  11     Let patient know, the initial prescription CellCept was denied by his insurance company, will replace with Imuran 50mg   twice a day

## 2022-08-30 NOTE — Telephone Encounter (Signed)
Called patient to let him know situation and he was agreeable to plan. Pt had no questions at this time but was encouraged to call back if questions arise. Pt verbalized understanding.

## 2022-08-30 NOTE — Addendum Note (Signed)
Addended by: Levert Feinstein on: 08/30/2022 09:53 AM   Modules accepted: Orders

## 2022-08-31 NOTE — Telephone Encounter (Signed)
Called and LVM for pt with this information. Ok to do per [employer].

## 2022-09-12 ENCOUNTER — Telehealth: Payer: Self-pay

## 2022-09-12 ENCOUNTER — Other Ambulatory Visit (HOSPITAL_COMMUNITY): Payer: Self-pay

## 2022-09-12 NOTE — Telephone Encounter (Signed)
Pharmacy Patient Advocate Encounter   Received notification from CoverMyMeds that prior authorization for azaTHIOprine 50MG  tablets is required/requested.   Insurance verification completed.   The patient is insured through [employer]/ [employer] .   Per test claim: PA required; PA submitted to [employer] via Fax Key/confirmation #/EOC [id number] Status is pending  Per CMM PA could not be submitted via CMM-downloaded filled out form and faxed along with clinical notes to [phone].

## 2022-09-15 DIAGNOSIS — J029 Acute pharyngitis, unspecified: Secondary | ICD-10-CM | POA: Diagnosis not present

## 2022-09-15 DIAGNOSIS — Z03818 Encounter for observation for suspected exposure to other biological agents ruled out: Secondary | ICD-10-CM | POA: Diagnosis not present

## 2022-09-15 NOTE — Telephone Encounter (Signed)
Pharmacy Patient Advocate Encounter  Received notification from [employer] that Prior Authorization for azaTHIOprine 50MG  tablets has been DENIED.  Full denial letter will be uploaded to the media tab. See denial reason below.  We denied this request under Medicare Part D because: Drug not prescribed for a medically accepted indication. The requested drug must meet the following therapeutic criteria: diagnosis of renal transplant rejection, rheumatoid arthritis.   PA #/Case ID/Reference #: [id number]   Please be advised we currently do not have a Pharmacist to review denials, therefore you will need to process appeals accordingly as needed. Thanks for your support at this time. Contact for appeals are as follows: Phone: [phone], Fax: [phone]  Last day to appeal is within 60 calendar days after date of this notice (09-12-2022)

## 2022-09-19 NOTE — Telephone Encounter (Signed)
We denied this request under Medicare Part D because: Drug not prescribed for a medically accepted indication. The requested drug must meet the following therapeutic criteria: diagnosis of renal transplant rejection, rheumatoid arthritis.

## 2022-09-19 NOTE — Telephone Encounter (Signed)
Please let patient know immuran was denied by his insurance, check his ocular symptoms? Dose he still has diplopia, any swallowing, chewing difficulties, any limb muscle weakness?

## 2022-09-20 NOTE — Telephone Encounter (Signed)
Returned to pt and he denied ocular symptoms and muscle weakness. He was advised immuran denied by insurance.

## 2022-09-25 DIAGNOSIS — Z7982 Long term (current) use of aspirin: Secondary | ICD-10-CM | POA: Diagnosis not present

## 2022-09-25 DIAGNOSIS — Z87891 Personal history of nicotine dependence: Secondary | ICD-10-CM | POA: Diagnosis not present

## 2022-09-25 DIAGNOSIS — E038 Other specified hypothyroidism: Secondary | ICD-10-CM | POA: Diagnosis not present

## 2022-09-25 DIAGNOSIS — N400 Enlarged prostate without lower urinary tract symptoms: Secondary | ICD-10-CM | POA: Diagnosis not present

## 2022-09-25 DIAGNOSIS — E039 Hypothyroidism, unspecified: Secondary | ICD-10-CM | POA: Diagnosis not present

## 2022-09-25 DIAGNOSIS — G629 Polyneuropathy, unspecified: Secondary | ICD-10-CM | POA: Diagnosis not present

## 2022-09-25 DIAGNOSIS — J309 Allergic rhinitis, unspecified: Secondary | ICD-10-CM | POA: Diagnosis not present

## 2022-09-25 DIAGNOSIS — E663 Overweight: Secondary | ICD-10-CM | POA: Diagnosis not present

## 2022-09-25 DIAGNOSIS — G63 Polyneuropathy in diseases classified elsewhere: Secondary | ICD-10-CM | POA: Diagnosis not present

## 2022-09-25 DIAGNOSIS — G7000 Myasthenia gravis without (acute) exacerbation: Secondary | ICD-10-CM | POA: Diagnosis not present

## 2022-09-28 DIAGNOSIS — C44319 Basal cell carcinoma of skin of other parts of face: Secondary | ICD-10-CM | POA: Diagnosis not present

## 2022-09-28 DIAGNOSIS — Z85828 Personal history of other malignant neoplasm of skin: Secondary | ICD-10-CM | POA: Diagnosis not present

## 2022-10-19 DIAGNOSIS — Z6826 Body mass index (BMI) 26.0-26.9, adult: Secondary | ICD-10-CM | POA: Diagnosis not present

## 2022-10-19 DIAGNOSIS — R053 Chronic cough: Secondary | ICD-10-CM | POA: Diagnosis not present

## 2022-10-24 DIAGNOSIS — L814 Other melanin hyperpigmentation: Secondary | ICD-10-CM | POA: Diagnosis not present

## 2022-10-24 DIAGNOSIS — D1801 Hemangioma of skin and subcutaneous tissue: Secondary | ICD-10-CM | POA: Diagnosis not present

## 2022-10-24 DIAGNOSIS — C44619 Basal cell carcinoma of skin of left upper limb, including shoulder: Secondary | ICD-10-CM | POA: Diagnosis not present

## 2022-10-24 DIAGNOSIS — Z85828 Personal history of other malignant neoplasm of skin: Secondary | ICD-10-CM | POA: Diagnosis not present

## 2022-10-24 DIAGNOSIS — L720 Epidermal cyst: Secondary | ICD-10-CM | POA: Diagnosis not present

## 2022-10-24 DIAGNOSIS — C44319 Basal cell carcinoma of skin of other parts of face: Secondary | ICD-10-CM | POA: Diagnosis not present

## 2022-10-24 DIAGNOSIS — C44519 Basal cell carcinoma of skin of other part of trunk: Secondary | ICD-10-CM | POA: Diagnosis not present

## 2022-10-24 DIAGNOSIS — L570 Actinic keratosis: Secondary | ICD-10-CM | POA: Diagnosis not present

## 2022-10-24 DIAGNOSIS — L820 Inflamed seborrheic keratosis: Secondary | ICD-10-CM | POA: Diagnosis not present

## 2022-10-24 DIAGNOSIS — D485 Neoplasm of uncertain behavior of skin: Secondary | ICD-10-CM | POA: Diagnosis not present

## 2022-10-24 DIAGNOSIS — D692 Other nonthrombocytopenic purpura: Secondary | ICD-10-CM | POA: Diagnosis not present

## 2022-10-24 DIAGNOSIS — L821 Other seborrheic keratosis: Secondary | ICD-10-CM | POA: Diagnosis not present

## 2022-10-25 DIAGNOSIS — N400 Enlarged prostate without lower urinary tract symptoms: Secondary | ICD-10-CM | POA: Diagnosis not present

## 2022-10-25 DIAGNOSIS — E7800 Pure hypercholesterolemia, unspecified: Secondary | ICD-10-CM | POA: Diagnosis not present

## 2022-10-25 DIAGNOSIS — Z9181 History of falling: Secondary | ICD-10-CM | POA: Diagnosis not present

## 2022-10-25 DIAGNOSIS — R7301 Impaired fasting glucose: Secondary | ICD-10-CM | POA: Diagnosis not present

## 2022-10-25 DIAGNOSIS — E559 Vitamin D deficiency, unspecified: Secondary | ICD-10-CM | POA: Diagnosis not present

## 2022-10-25 DIAGNOSIS — G7000 Myasthenia gravis without (acute) exacerbation: Secondary | ICD-10-CM | POA: Diagnosis not present

## 2022-10-25 DIAGNOSIS — Z0000 Encounter for general adult medical examination without abnormal findings: Secondary | ICD-10-CM | POA: Diagnosis not present

## 2022-10-25 DIAGNOSIS — E890 Postprocedural hypothyroidism: Secondary | ICD-10-CM | POA: Diagnosis not present

## 2022-10-25 DIAGNOSIS — G609 Hereditary and idiopathic neuropathy, unspecified: Secondary | ICD-10-CM | POA: Diagnosis not present

## 2022-10-25 DIAGNOSIS — I700 Atherosclerosis of aorta: Secondary | ICD-10-CM | POA: Diagnosis not present

## 2022-10-25 DIAGNOSIS — Z79899 Other long term (current) drug therapy: Secondary | ICD-10-CM | POA: Diagnosis not present

## 2022-10-25 DIAGNOSIS — Z6826 Body mass index (BMI) 26.0-26.9, adult: Secondary | ICD-10-CM | POA: Diagnosis not present

## 2022-11-02 ENCOUNTER — Ambulatory Visit: Payer: PPO | Admitting: Adult Health

## 2022-11-02 ENCOUNTER — Telehealth: Payer: Self-pay

## 2022-11-02 ENCOUNTER — Encounter: Payer: Self-pay | Admitting: Adult Health

## 2022-11-02 VITALS — BP 146/79 | HR 68 | Ht 69.0 in | Wt 184.8 lb

## 2022-11-02 DIAGNOSIS — G7000 Myasthenia gravis without (acute) exacerbation: Secondary | ICD-10-CM | POA: Diagnosis not present

## 2022-11-02 DIAGNOSIS — R4189 Other symptoms and signs involving cognitive functions and awareness: Secondary | ICD-10-CM

## 2022-11-02 MED ORDER — PREDNISONE 2.5 MG PO TABS: 2.5000 mg | ORAL_TABLET | Freq: Every day | ORAL | 0 refills | Status: DC

## 2022-11-02 MED ORDER — PYRIDOSTIGMINE BROMIDE 60 MG PO TABS: 60.0000 mg | ORAL_TABLET | Freq: Three times a day (TID) | ORAL | 11 refills | Status: DC

## 2022-11-02 NOTE — Progress Notes (Signed)
Chief Complaint  Patient presents with   Follow-up    Patient in room #3 with his wife. Patient states he's eyes are very watery all the time. Patient wife states he been confuse lately.    ASSESSMENT AND PLAN  Steve Thompson is a 89 y.o. male  Seropositive myasthenia gravis  Present since 09/2021 - Mainly ocular symptoms,  Confirmed by positive acetylcholine binding antibody December 2023, 7.27  Weaned off prednisone back in April, about 1 month later he had recurrent diplopia, improved after higher dose of prednisone, tolerating tapering, no recurrent symptoms Per Dr. Zannie Cove recommendations, will further taper down prednisone from 5mg  to 2.5mg  for 2 weeks then stop. Advised to call with any recurrent visual symptoms continue Mestinon 30 mg 3 times daily as needed CellCept and Imuran denied by patient's insurance   Cognitive impairment  Present over the past several years, denies progression, short-term memory issues  MR brain largely normal for age   Lab work 01/2022 normal B12 and TSH  MOCA 19/30 04/2022 - will repeat at follow up visit   Discussed importance of routine memory and physical exercises, ensuring good sleep, and maintaining a healthy diet    Follow-up in February as scheduled       DIAGNOSTIC DATA (LABS, IMAGING, TESTING) - I reviewed patient records, labs, notes, testing and imaging myself where available.  Lab work 08/2022: CMP and CBC/D WNL, A1c 6.1 (previously 5.8)   Laboratory evaluations from last home January 02, 2022, normal thyroid peroxidase, TSH was mildly elevated 4.91, normal T3 86, C-reactive protein 1.6, positive acetylcholine receptor binding antibody 7.27, negative protein, moderate antibody, normal CBC, hemoglobin of 15.9,   MEDICAL HISTORY:   Update 11/02/2022 JM: Patient returns for follow-up visit after prior visit with Dr. Terrace Arabia 2 months ago.  He is accompanied by his wife.  At prior visit, recommended further reducing prednisone and  added CellCept in addition to Mestinon as needed.  Unfortunately, CellCept declined by patient's insurance therefore switched Imuran but this was also denied by patient's insurance. Currently taking prednisone 5mg  and Mestinon 0.5 tab 1-3 times per day as needed. Denies any current visual concerns, does c/o dry eye is is chronic.  Denies any limb muscle weakness, swallowing or chewing difficulties.  Wife also mentions continued short-term memory loss present over the past several years, denies any progression.  Previously completed workup with MRI brain which showed mild age-related changes but otherwise unremarkable.  Lab work for reversible causes unremarkable.    History provided for reference purposes only UPDATE August 7th 2024 Dr. Terrace Arabia: He had a recurrent double vision around June 2024, a month after he stopped taking prednisone, even taking Mestinon 30 to 60 mg 3 times a day without helping his double vision and blurry vision, complains of GI side effect with 60 mg  He was started on prednisone 20 mg daily tapering following his visit with Shanda Bumps August 01, 2022, gradually noticed improvement, now on 10 mg daily, Mestinon 30 mg 3 times a day, no longer has visual issues  He denies bulbar limb muscle weakness,  Update 08/01/2022 JM: Patient returns for sooner visit accompanied by his wife due to complaints of recurrence of diplopia that started about 2 weeks ago.  He has been off prednisone therapy since April and doing well up until this time. Once diplopia restarted, he started taking 1/2 tab of mestinon for 1 week and tried to increase to full tab last week but made symptoms worse  therefore stopped 2 days ago Denies eye lid fatigue. Able to see better with one eye closed, has double and blurred vision with both eyes open.  Was seen by Dr. Charlotte Sanes this morning who noted right hypertropia. Denies limb weakness or any other symptoms. Continues to have mild unsteadiness and imbalance. Unchanged but per  wife, can fluctuate with more severe unsteadiness at times which can lead to falls, thankfully denies injury.   UPDATE May 03 2022 Dr Terrace Arabia: He is with his wife at today's clinical visit, overall stable, has used up all his prednisone supply, no longer taking it, did not notice flareup of his muscle weakness, diplopia, last dose of Mestinon was 2 days ago, continue have memory loss, but denied chewing difficulty, mild unsteady gait which is at his baseline   Today's examination showed no significant bulbar, extraocular muscle, or limb muscle weakness.   Laboratory evaluations, normal CMP with exception of slight elevation of calcium 10.5, A1c 5.8, normal TSH, negative RPR, normal B12 528, positive acetylcholine binding antibody 7.4, blocking antibody 40, modulating antibody 63 all elevated,   Update 01/31/2022 Dr. Terrace Arabia: Steve Thompson, is a 89 year old male, accompanied by his wife, seen in request by his ophthalmologist Dr. Maris Berger for evaluation of double vision, his primary care physician is [facility] physician Dr. Tenny Craw, Darlen Round, for evaluation was on January 31, 2022  I reviewed and summarized the referring note. PMHX. HLD Hypothyrodism Parathyroidectomy in 2010.  He had right cataract surgery in September 2023 for complaints of worsening drowsiness, since surgery, he complains of continued blurriness, also noticed intermittent double vision, droopy eyelid, he has to close 1 eye to make the picture sharp,  Patient denies bulbar weakness, denies difficulty chewing, swallowing, no significant muscle weakness, already has baseline mild unsteady gait, wife also reported over the past couple years, he has noticeable slow worsening memory loss  He is a retired Production designer, theatre/television/film for [employer], [employer] examination 19/30 today  He was seen by ophthalmologist Dr. Charlotte Sanes, ordered laboratory evaluation which demonstrated positive acetylcholine receptor binding antibody 7.27  I also  personally reviewed MRI of the brain and orbits with and without contrast, no acute intracranial abnormality, mild generalized atrophy small vessel disease,       PHYSICAL EXAM:   Vitals:   11/02/22 0913  BP: (!) 146/79  Pulse: 68  Weight: 184 lb 12.8 oz (83.8 kg)  Height: 5\' 9"  (1.753 m)   Body mass index is 27.29 kg/m.  PHYSICAL EXAMNIATION:  Gen: NAD, very pleasant elderly Caucasian male, conversant, well nourised, well groomed                     Cardiovascular: Regular rate rhythm, no peripheral edema, warm, nontender. Eyes: Conjunctivae clear without exudates or hemorrhage Neck: Supple, no carotid bruits. Pulmonary: Clear to auscultation bilaterally   NEUROLOGICAL EXAM:  MENTAL STATUS: Speech/cognition: Awake, alert, oriented to history taking and casual conversation     05/03/2022    9:53 AM 01/31/2022    3:00 PM  Montreal Cognitive Assessment   Visuospatial/ Executive (0/5) 3 3  Naming (0/3) 1 2  Attention: Read list of digits (0/2) 2 2  Attention: Read list of letters (0/1) 1 1  Attention: Serial 7 subtraction starting at 100 (0/3) 3 3  Language: Repeat phrase (0/2) 2 2  Language : Fluency (0/1) 0 0  Abstraction (0/2) 1 1  Delayed Recall (0/5) 0 0  Orientation (0/6) 6 5  Total 19 19  CRANIAL NERVES: CN II: Visual fields are full to confrontation. Pupils are round equal and briskly reactive to light. CN III, IV, VI: unable to appreciate eyelid ptosis, mild bilateral exophoria CN V: Facial sensation is intact to light touch CN VII: Face is symmetric with normal eye closure  CN VIII: Hearing is normal to causal conversation. CN IX, X: Phonation is normal. CN XI: Head turning and shoulder shrug are intact  MOTOR: Full strength in all tested extremities  REFLEXES: Reflexes are 2+ and symmetric at the biceps, triceps, knees, and ankles. Plantar responses are flexor.  SENSORY: Intact to light touch, pinprick and vibratory sensation are intact in  fingers and toes.  COORDINATION: There is no trunk or limb dysmetria noted.  GAIT/STANCE: Posture is normal. Gait is steady with normal steps, base, arm swing, and turning. Heel and toe walking are normal. Tandem gait is normal.    REVIEW OF SYSTEMS:  Full 14 system review of systems performed and notable only for as above All other review of systems were negative.   ALLERGIES: No Known Allergies  HOME MEDICATIONS: Current Outpatient Medications  Medication Sig Dispense Refill   aspirin 81 MG tablet Take 81 mg by mouth daily. Patient also taking a medication for cholesterol, but does not remember name or dosage. Will provide at next visit.      gabapentin (NEURONTIN) 300 MG capsule Take 300 mg by mouth 4 (four) times daily.     loratadine (CLARITIN) 10 MG tablet 1 tablet Orally Once a day     predniSONE (DELTASONE) 5 MG tablet Take 1 tablet (5 mg total) by mouth daily with breakfast. 30 tablet 6   pyridostigmine (MESTINON) 60 MG tablet Take 1 tablet (60 mg total) by mouth 3 (three) times daily. 90 tablet 5   SYNTHROID 88 MCG tablet Take 1 tablet (88 mcg total) by mouth daily. (Patient taking differently: Take 100 mcg by mouth daily.) 30 tablet 6   tamsulosin (FLOMAX) 0.4 MG CAPS capsule Take 0.4 mg by mouth daily.     azaTHIOprine (IMURAN) 50 MG tablet Take 1 tablet (50 mg total) by mouth 2 (two) times daily. (Patient not taking: Reported on 11/02/2022) 60 tablet 11   No current facility-administered medications for this visit.    PAST MEDICAL HISTORY: Past Medical History:  Diagnosis Date   Arthritis    Hemorrhoids    Hypercalcemia    Hyperthyroidism    Peripheral neuropathy     PAST SURGICAL HISTORY: Past Surgical History:  Procedure Laterality Date   HEMORRHOID SURGERY     PARATHYROIDECTOMY  10/13/10    FAMILY HISTORY: Family History  Problem Relation Age of Onset   Cancer Sister        lung    SOCIAL HISTORY: Social History   Socioeconomic History    Marital status: Married    Spouse name: Not on file   Number of children: Not on file   Years of education: Not on file   Highest education level: Not on file  Occupational History   Not on file  Tobacco Use   Smoking status: Former   Smokeless tobacco: Never  Substance and Sexual Activity   Alcohol use: No   Drug use: No   Sexual activity: Not on file  Other Topics Concern   Not on file  Social History Narrative   Not on file   Social Determinants of Health   Financial Resource Strain: Not on file  Food Insecurity: Not on file  Transportation  Needs: Not on file  Physical Activity: Not on file  Stress: Not on file  Social Connections: Not on file  Intimate Partner Violence: Not on file      I spent 30 minutes of face-to-face and non-face-to-face time with patient and wife.  This included previsit chart review, lab review, study review, order entry, electronic health record documentation, patient and wife education and discussion regarding above diagnoses and treatment plan and answered all other questions to patient and wife's satisfaction  Ihor Austin, AGNP-BC  [facility] [address] [place], [place] [address]  Phone [phone] Fax [phone] Note: This document was prepared with digital dictation and possible smart phrase technology. Any transcriptional errors that result from this process are unintentional.

## 2022-11-02 NOTE — Telephone Encounter (Signed)
-----   Message from Ihor Austin sent at 11/02/2022 10:07 AM EDT ----- Please advise patient/wife of Dr. Zannie Cove recommendations (discussed with Dr. Terrace Arabia after patient left office) - recommends further reducing prednisone dose from 5 mg to 2.5 mg for 2 weeks then stop.  New rx sent to pharmacy with 2.5mg  tablets but can break current tablets in half if able. Please call with any recurrent visual symptoms

## 2022-11-02 NOTE — Patient Instructions (Addendum)
Your Plan:  Continue prednisone 5 mg daily - you will be called regarding further recommendations from Dr. Terrace Arabia regarding continuation at current dose or further reducing dosage  Continue Mestinon half tab up to three times daily as needed  Continue to monitor memory concerns, doing memory exercises such as crossword puzzles, word search and sudoku can help with short term memory loss, also ensuring you are getting good sleep, keeping physically active and maintaining a good diet can also help    Follow-up in February as scheduled     Thank you for coming to see [place] at [facility]. I hope we have been able to provide you high quality care today.  You may receive a patient satisfaction survey over the next few weeks. We would appreciate your feedback and comments so that we may continue to improve ourselves and the health of our patients.

## 2022-11-02 NOTE — Telephone Encounter (Signed)
Called pt back and informed him that his doses has reduce to 2.5mg  daily at breakfast. Pt verbalized understanding. Pt had no questions at this time but was encouraged to call back if questions arise.

## 2022-11-02 NOTE — Telephone Encounter (Signed)
Called patient and LVM to give us a call back

## 2022-11-03 DIAGNOSIS — C44519 Basal cell carcinoma of skin of other part of trunk: Secondary | ICD-10-CM | POA: Diagnosis not present

## 2022-11-03 DIAGNOSIS — C44619 Basal cell carcinoma of skin of left upper limb, including shoulder: Secondary | ICD-10-CM | POA: Diagnosis not present

## 2022-11-03 DIAGNOSIS — Z85828 Personal history of other malignant neoplasm of skin: Secondary | ICD-10-CM | POA: Diagnosis not present

## 2022-12-05 DIAGNOSIS — Z85828 Personal history of other malignant neoplasm of skin: Secondary | ICD-10-CM | POA: Diagnosis not present

## 2022-12-05 DIAGNOSIS — C44319 Basal cell carcinoma of skin of other parts of face: Secondary | ICD-10-CM | POA: Diagnosis not present

## 2023-01-22 ENCOUNTER — Telehealth: Payer: Self-pay | Admitting: Adult Health

## 2023-01-22 NOTE — Telephone Encounter (Signed)
 Pt said had a hemorrhage in right eye yesterday. After church wife notice right eye was bloody. Went to urgent care yesterday (01/21/23), diagnosed with hemorrhage in right eye. Want to make sure Dr. Terrace Arabia is aware. Would like a earlier appt to see Dr. Terrace Arabia.

## 2023-01-22 NOTE — Telephone Encounter (Signed)
 Call to patient, he states yesterday ( 1/5) his wife noticed redness in right eye and went to urgent care. Was diagnosed with hemorrhage in right right with no treatment.  He denies any vision problems or headaches. He states nothing seem to have exacerbated the hemorrhage that he is aware of. Advised I would send to Dr. Onita to review and reach back out with recommendations.

## 2023-02-25 ENCOUNTER — Observation Stay (HOSPITAL_COMMUNITY): Payer: PPO

## 2023-02-25 ENCOUNTER — Emergency Department (HOSPITAL_COMMUNITY): Payer: PPO

## 2023-02-25 ENCOUNTER — Inpatient Hospital Stay (HOSPITAL_COMMUNITY)
Admission: EM | Admit: 2023-02-25 | Discharge: 2023-02-28 | DRG: 065 | Disposition: A | Discharge status: Home Health | Payer: PPO | Attending: Internal Medicine | Admitting: Internal Medicine

## 2023-02-25 ENCOUNTER — Other Ambulatory Visit: Payer: Self-pay

## 2023-02-25 ENCOUNTER — Encounter (HOSPITAL_COMMUNITY): Payer: Self-pay | Admitting: Emergency Medicine

## 2023-02-25 DIAGNOSIS — H9190 Unspecified hearing loss, unspecified ear: Secondary | ICD-10-CM | POA: Diagnosis present

## 2023-02-25 DIAGNOSIS — I639 Cerebral infarction, unspecified: Principal | ICD-10-CM

## 2023-02-25 DIAGNOSIS — W19XXXA Unspecified fall, initial encounter: Secondary | ICD-10-CM | POA: Diagnosis present

## 2023-02-25 DIAGNOSIS — G7000 Myasthenia gravis without (acute) exacerbation: Secondary | ICD-10-CM | POA: Diagnosis present

## 2023-02-25 DIAGNOSIS — I6381 Other cerebral infarction due to occlusion or stenosis of small artery: Secondary | ICD-10-CM | POA: Diagnosis not present

## 2023-02-25 DIAGNOSIS — Z801 Family history of malignant neoplasm of trachea, bronchus and lung: Secondary | ICD-10-CM

## 2023-02-25 DIAGNOSIS — I10 Essential (primary) hypertension: Secondary | ICD-10-CM | POA: Diagnosis present

## 2023-02-25 DIAGNOSIS — G510 Bell's palsy: Secondary | ICD-10-CM | POA: Diagnosis present

## 2023-02-25 DIAGNOSIS — Z87891 Personal history of nicotine dependence: Secondary | ICD-10-CM

## 2023-02-25 DIAGNOSIS — G8194 Hemiplegia, unspecified affecting left nondominant side: Secondary | ICD-10-CM | POA: Diagnosis present

## 2023-02-25 DIAGNOSIS — G459 Transient cerebral ischemic attack, unspecified: Secondary | ICD-10-CM | POA: Diagnosis present

## 2023-02-25 DIAGNOSIS — N400 Enlarged prostate without lower urinary tract symptoms: Secondary | ICD-10-CM | POA: Diagnosis present

## 2023-02-25 DIAGNOSIS — R9082 White matter disease, unspecified: Secondary | ICD-10-CM | POA: Diagnosis present

## 2023-02-25 DIAGNOSIS — Z79899 Other long term (current) drug therapy: Secondary | ICD-10-CM

## 2023-02-25 DIAGNOSIS — Z79890 Hormone replacement therapy (postmenopausal): Secondary | ICD-10-CM

## 2023-02-25 DIAGNOSIS — E039 Hypothyroidism, unspecified: Secondary | ICD-10-CM | POA: Diagnosis present

## 2023-02-25 DIAGNOSIS — Z7902 Long term (current) use of antithrombotics/antiplatelets: Secondary | ICD-10-CM

## 2023-02-25 DIAGNOSIS — R471 Dysarthria and anarthria: Secondary | ICD-10-CM | POA: Diagnosis present

## 2023-02-25 DIAGNOSIS — E785 Hyperlipidemia, unspecified: Secondary | ICD-10-CM | POA: Diagnosis present

## 2023-02-25 DIAGNOSIS — R29703 NIHSS score 3: Secondary | ICD-10-CM | POA: Diagnosis present

## 2023-02-25 DIAGNOSIS — G629 Polyneuropathy, unspecified: Secondary | ICD-10-CM | POA: Diagnosis present

## 2023-02-25 DIAGNOSIS — Z7982 Long term (current) use of aspirin: Secondary | ICD-10-CM

## 2023-02-25 DIAGNOSIS — M19012 Primary osteoarthritis, left shoulder: Secondary | ICD-10-CM | POA: Diagnosis present

## 2023-02-25 LAB — COMPREHENSIVE METABOLIC PANEL
ALT: 17 U/L (ref 0–44)
AST: 25 U/L (ref 15–41)
Albumin: 3.8 g/dL (ref 3.5–5.0)
Alkaline Phosphatase: 50 U/L (ref 38–126)
Anion gap: 11 (ref 5–15)
BUN: 13 mg/dL (ref 8–23)
CO2: 25 mmol/L (ref 22–32)
Calcium: 9.6 mg/dL (ref 8.9–10.3)
Chloride: 101 mmol/L (ref 98–111)
Creatinine, Ser: 0.87 mg/dL (ref 0.61–1.24)
GFR, Estimated: >60 mL/min (ref >60)
Glucose, Bld: 114 mg/dL — ABNORMAL HIGH (ref 70–99)
Potassium: 3.9 mmol/L (ref 3.5–5.1)
Sodium: 137 mmol/L (ref 135–145)
Total Bilirubin: 0.7 mg/dL (ref 0.0–1.2)
Total Protein: 6.6 g/dL (ref 6.5–8.1)

## 2023-02-25 LAB — CBC
HCT: 44.2 % (ref 39.0–52.0)
Hemoglobin: 15.1 g/dL (ref 13.0–17.0)
MCH: 30.3 pg (ref 26.0–34.0)
MCHC: 34.2 g/dL (ref 30.0–36.0)
MCV: 88.6 fL (ref 80.0–100.0)
Platelets: 314 10*3/uL (ref 150–400)
RBC: 4.99 MIL/uL (ref 4.22–5.81)
RDW: 12.7 % (ref 11.5–15.5)
WBC: 9.2 10*3/uL (ref 4.0–10.5)
nRBC: 0 % (ref 0.0–0.2)

## 2023-02-25 LAB — DIFFERENTIAL
Abs Immature Granulocytes: 0.3 10*3/uL — ABNORMAL HIGH (ref 0.00–0.07)
Basophils Absolute: 0.1 10*3/uL (ref 0.0–0.1)
Basophils Relative: 1 %
Eosinophils Absolute: 0.2 10*3/uL (ref 0.0–0.5)
Eosinophils Relative: 2 %
Immature Granulocytes: 3 %
Lymphocytes Relative: 14 %
Lymphs Abs: 1.3 10*3/uL (ref 0.7–4.0)
Monocytes Absolute: 0.8 10*3/uL (ref 0.1–1.0)
Monocytes Relative: 8 %
Neutro Abs: 6.5 10*3/uL (ref 1.7–7.7)
Neutrophils Relative %: 72 %

## 2023-02-25 LAB — I-STAT CHEM 8, ED
BUN: 15 mg/dL (ref 8–23)
Calcium, Ion: 1.11 mmol/L — ABNORMAL LOW (ref 1.15–1.40)
Chloride: 102 mmol/L (ref 98–111)
Creatinine, Ser: 0.8 mg/dL (ref 0.61–1.24)
Glucose, Bld: 109 mg/dL — ABNORMAL HIGH (ref 70–99)
HCT: 44 % (ref 39.0–52.0)
Hemoglobin: 15 g/dL (ref 13.0–17.0)
Potassium: 3.9 mmol/L (ref 3.5–5.1)
Sodium: 137 mmol/L (ref 135–145)
TCO2: 25 mmol/L (ref 22–32)

## 2023-02-25 LAB — PROTIME-INR
INR: 1 (ref 0.8–1.2)
Prothrombin Time: 13.6 s (ref 11.4–15.2)

## 2023-02-25 LAB — ETHANOL: Alcohol, Ethyl (B): <10 mg/dL (ref <10)

## 2023-02-25 LAB — CBG MONITORING, ED: Glucose-Capillary: 106 mg/dL — ABNORMAL HIGH (ref 70–99)

## 2023-02-25 LAB — TSH: TSH: 1.771 u[IU]/mL (ref 0.350–4.500)

## 2023-02-25 LAB — APTT: aPTT: 27 s (ref 24–36)

## 2023-02-25 MED ORDER — ACETAMINOPHEN 325 MG PO TABS
650.0000 mg | ORAL_TABLET | ORAL | Status: DC | PRN
  Administered 2023-02-25 – 2023-02-28 (×8): 650 mg via ORAL
  Filled 2023-02-25 (×8): qty 2

## 2023-02-25 MED ORDER — SENNOSIDES-DOCUSATE SODIUM 8.6-50 MG PO TABS: 1.0000 | ORAL_TABLET | Freq: Every evening | ORAL | Status: DC | PRN

## 2023-02-25 MED ORDER — HYDRALAZINE HCL 20 MG/ML IJ SOLN: 10.0000 mg | INTRAMUSCULAR | Status: DC | PRN

## 2023-02-25 MED ORDER — ACETAMINOPHEN 650 MG RE SUPP: 650.0000 mg | RECTAL | Status: DC | PRN

## 2023-02-25 MED ORDER — CLOPIDOGREL BISULFATE 75 MG PO TABS
75.0000 mg | ORAL_TABLET | Freq: Every day | ORAL | Status: DC
  Administered 2023-02-25 – 2023-02-28 (×4): 75 mg via ORAL
  Filled 2023-02-25 (×4): qty 1

## 2023-02-25 MED ORDER — ASPIRIN 81 MG PO TBEC
81.0000 mg | DELAYED_RELEASE_TABLET | Freq: Every day | ORAL | Status: DC
  Administered 2023-02-26 – 2023-02-28 (×3): 81 mg via ORAL
  Filled 2023-02-25 (×3): qty 1

## 2023-02-25 MED ORDER — IOHEXOL 350 MG/ML SOLN
75.0000 mL | Freq: Once | INTRAVENOUS | Status: AC | PRN
  Administered 2023-02-25: 75 mL via INTRAVENOUS

## 2023-02-25 MED ORDER — LEVOTHYROXINE SODIUM 100 MCG PO TABS
100.0000 ug | ORAL_TABLET | Freq: Every day | ORAL | Status: DC
  Administered 2023-02-26 – 2023-02-28 (×3): 100 ug via ORAL
  Filled 2023-02-25 (×3): qty 1

## 2023-02-25 MED ORDER — STROKE: EARLY STAGES OF RECOVERY BOOK: Freq: Once | Status: DC

## 2023-02-25 MED ORDER — ENOXAPARIN SODIUM 40 MG/0.4ML IJ SOSY
40.0000 mg | PREFILLED_SYRINGE | INTRAMUSCULAR | Status: DC
  Administered 2023-02-25 – 2023-02-27 (×3): 40 mg via SUBCUTANEOUS
  Filled 2023-02-25 (×3): qty 0.4

## 2023-02-25 MED ORDER — ACETAMINOPHEN 160 MG/5ML PO SOLN: 650.0000 mg | ORAL | Status: DC | PRN

## 2023-02-25 MED ORDER — SODIUM CHLORIDE 0.9% FLUSH
3.0000 mL | Freq: Once | INTRAVENOUS | Status: AC
  Administered 2023-02-25: 3 mL via INTRAVENOUS

## 2023-02-25 NOTE — ED Notes (Signed)
 Patient transported to MRI

## 2023-02-25 NOTE — ED Notes (Signed)
IP MD at bedside 

## 2023-02-25 NOTE — Code Documentation (Signed)
 Stroke Response Nurse Documentation Code Documentation  Steve Thompson is a 89 y.o. male arriving to [facility]  via [facility] on 02-25-2023 with past medical hx of ocular myasthenia gravis, peripheral neuropathy, arthritis. On aspirin  81 mg daily. Code stroke was activated by EMS.   Patient from home where he was LKW at 1130 and now complaining of left side weakness and falls.  Stroke team at the bedside on patient arrival. Labs drawn and patient cleared for CT by Dr. Dean. Patient to CT with team. NIHSS 3, see documentation for details and code stroke times. Patient with left facial droop, left arm weakness, and left leg weakness on exam. The following imaging was completed:  CT Head. Patient is not a candidate for IV Thrombolytic due to mild symptoms. Patient is not a candidate for IR due to no LVO suspected.   Care Plan: NIHSS and VS q 30 min until 1530 then 2hr x 12 hours then q 4 hours.   Bedside handoff with ED RN Josh.    Elvin Portland  Stroke Response RN

## 2023-02-25 NOTE — Consult Note (Signed)
 NEUROLOGY CONSULT NOTE   Date of service: February 25, 2023 Patient Name: Steve Thompson MRN:  [id number] DOB:  05/01/1931 Chief Complaint: Left sided weakness Requesting Provider: Dean Clarity, MD  History of Present Illness  MARCELIS Thompson is an 89 y.o. male with a PMHx of Arthritis, Hemorrhoids, Hypercalcemia, Hyperthyroidism, and Peripheral neuropathy who presents from home via EMS as a Code Stroke. Last known normal was 11 AM. At 11:30 PM he got up out of his chair, his LLE gave out and he fell. He subsequently had another fall. EMS was then called and on their arrival he seemed to have full strength in all 4 extremities against resistance, but when walking he was listing to the left. Left face was drooping as well. BP per EMS was initially 198/100, which then improved to 156/84. 96% RA, CBG 136.   He denies any sensory numbness, aphasia, vision changes, confusion, CP, SOB or fever.   His home medications include ASA.   LKW: 11 AM Modified rankin score: 0-Completely asymptomatic and back to baseline post- stroke IV Thrombolysis:  No: Discussed risks/benefits of treatment versus no treatment with the patient, who elected not to proceed with TNK due to the risk of ICH EVT:  No: Overall presentation not consistent with LVO   NIHSS components Score: Comment  1a Level of Conscious 0[x]  1[]  2[]  3[]      1b LOC Questions 0[x]  1[]  2[]       1c LOC Commands 0[x]  1[]  2[]       2 Best Gaze 0[x]  1[]  2[]       3 Visual 0[x]  1[]  2[]  3[]      4 Facial Palsy 0[]  1[x]  2[]  3[]      5a Motor Arm - left 0[]  1[x]  2[]  3[]  4[]  UN[]    5b Motor Arm - Right 0[x]  1[]  2[]  3[]  4[]  UN[]    6a Motor Leg - Left 0[]  1[x]  2[]  3[]  4[]  UN[]    6b Motor Leg - Right 0[x]  1[]  2[]  3[]  4[]  UN[]    7 Limb Ataxia 0[x]  1[]  2[]  3[]  UN[]     8 Sensory 0[x]  1[]  2[]  UN[]      9 Best Language 0[x]  1[]  2[]  3[]      10 Dysarthria 0[x]  1[]  2[]  UN[]      11 Extinct. and Inattention 0[x]  1[]  2[]       TOTAL:   3      ROS  Comprehensive  ROS performed and pertinent positives documented in HPI.    Past History   Past Medical History:  Diagnosis Date   Arthritis    Hemorrhoids    Hypercalcemia    Hyperthyroidism    Peripheral neuropathy     Past Surgical History:  Procedure Laterality Date   HEMORRHOID SURGERY     PARATHYROIDECTOMY  10/13/10    Family History: Family History  Problem Relation Age of Onset   Cancer Sister        lung    Social History  reports that he has quit smoking. He has never used smokeless tobacco. He reports that he does not drink alcohol  and does not use drugs.  No Known Allergies  Medications   Current Facility-Administered Medications:    sodium chloride  flush (NS) 0.9 % injection 3 mL, 3 mL, Intravenous, Once, Haviland, Julie, MD  Current Outpatient Medications:    aspirin  81 MG tablet, Take 81 mg by mouth daily. Patient also taking a medication for cholesterol, but does not remember name or dosage. Will provide at next visit. , Disp: , Rfl:  gabapentin  (NEURONTIN ) 300 MG capsule, Take 300 mg by mouth 4 (four) times daily., Disp: , Rfl:    loratadine  (CLARITIN ) 10 MG tablet, 1 tablet Orally Once a day, Disp: , Rfl:    predniSONE  (DELTASONE ) 2.5 MG tablet, Take 1 tablet (2.5 mg total) by mouth daily with breakfast., Disp: 14 tablet, Rfl: 0   pyridostigmine  (MESTINON ) 60 MG tablet, Take 1 tablet (60 mg total) by mouth 3 (three) times daily., Disp: 90 tablet, Rfl: 11   SYNTHROID  88 MCG tablet, Take 1 tablet (88 mcg total) by mouth daily. (Patient taking differently: Take 100 mcg by mouth daily.), Disp: 30 tablet, Rfl: 6   tamsulosin  (FLOMAX ) 0.4 MG CAPS capsule, Take 0.4 mg by mouth daily., Disp: , Rfl:   Vitals   Vitals:   02/25/23 1400  Weight: 88.2 kg    Body mass index is 28.71 kg/m.  Physical Exam   Physical Exam  HEENT:  [place]/AT Lungs: Respirations unlabored Extremities: Warm and well perfused.   Neurological Examination Mental Status: Awake and alert.  Oriented x 5. Speech fluent with intact naming and comprehension.  Cranial Nerves: II: Visual fields intact bilaterally. No extinction to DSS.   III,IV, VI: No ptosis. EOMI with saccadic pursuits. No nystagmus.  V: Temp sensation equal bilaterally VII: Left facial droop VIII: HOH IX,X: No hoarseness or hypophonia XI: Left shoulder pain limits testing XII: Midline tongue extension Motor: RUE and RLE 5/5 LUE 4+/5 proximally and distally (subtle weakness and mild drift) LLE 4+/5 hip flexion and knee extension (subtle weakness and mild drift) Positive orbiting fingers test on the left.  Sensory: Temp and light touch intact throughout, bilaterally. No extinction to DSS.  Deep Tendon Reflexes: 1+ and symmetric throughout Cerebellar: No ataxia with FNF or H-S bilaterally Gait: Deferred  Labs/Imaging/Neurodiagnostic studies   CBC: No results for input(s): WBC, NEUTROABS, HGB, HCT, MCV, PLT in the last 168 hours. Basic Metabolic Panel:  Lab Results  Component Value Date   NA 140 08/23/2022   K 4.2 08/23/2022   CO2 28 08/23/2022   GLUCOSE 107 (H) 08/23/2022   BUN 17 08/23/2022   CREATININE 0.95 08/23/2022   CALCIUM  9.4 08/23/2022   GFRNONAA >60 10/10/2010   GFRAA >60 10/10/2010   Lipid Panel: No results found for: LDLCALC HgbA1c:  Lab Results  Component Value Date   HGBA1C 6.1 (H) 08/23/2022   Urine Drug Screen: No results found for: LABOPIA, COCAINSCRNUR, LABBENZ, AMPHETMU, THCU, LABBARB  Alcohol  Level No results found for: [facility] INR  Lab Results  Component Value Date   INR 0.97 10/10/2010     ASSESSMENT  89 y.o. male with a PMHx of Arthritis, Hemorrhoids, Hypercalcemia, Hyperthyroidism, and Peripheral neuropathy who presents from home via EMS as a Code Stroke. Last known normal was 11 AM. At 11:30 PM he got up out of his chair, his LLE gave out and he fell. He subsequently had another fall. EMS was then called and on their arrival he seemed to  have full strength in all 4 extremities against resistance, but when walking he was listing to the left. Left face was drooping as well. BP per EMS was initially 198/100, which then improved to 156/84. His home medications include ASA.  - Exam reveals left facial droop, LUE and LLE weakness. NIHSS 3.  - CT head: No acute intracranial abnormality or significant interval change. Mild atrophy and progressive white matter disease. This likely reflects the sequela of chronic microvascular ischemia. Aspects is 10/10 - Discussed risks/benefits of  TNK treatment versus no treatment with the patient, who elected not to proceed with TNK due to the risk of ICH - Impression: Acute ischemic stroke, most likely a lacunar infarction within the right cerebral hemisphere or pons.    RECOMMENDATIONS  1. HgbA1c, fasting lipid panel 2. MRI 3. PT consult, OT consult, Speech consult 4. Echocardiogram 5. CTA of head and neck 6. Add Plavix  to ASA. Classifiable as having failed ASA monotherapy.   7. Risk factor modification 8. Telemetry monitoring 9. Frequent neuro checks 10. NPO until passes stroke swallow screen 11. Hold off on statin for now. Based on his advanced age, potential for long term benefits may be exceeded by short term and long-term risks 75. Modified permissive HTN protocol given advanced age. For the next 24 hours, treat SBP if > 180. After 24 hours, lower BP gradually to goal of 120/80   ______________________________________________________________________    Bonney SHARK, Lilyanna Lunt, MD [facility]

## 2023-02-25 NOTE — ED Triage Notes (Signed)
 PT BIB [facility] from home as a Code Stroke for sudden onset of L. Side arm and leg weakness beginning at 11:30. LKW 11am. Pt had 2 mild falls, 1 in bathroom , 1 beside couch.  No obvious injuries, not pain.EMS endorses altered gait. Pt does complain of chronic pain in left shoulder, worsening with weakness in left arm.   156/84 96% 65 CBG 136

## 2023-02-25 NOTE — ED Notes (Signed)
 MD Opyd messaged regarding patient BP; he is okay with HTN currently due to patient acute infarct; no new orders at this time.

## 2023-02-25 NOTE — Progress Notes (Signed)
  Echocardiogram 2D Echocardiogram has been performed.  Dione Franks 02/25/2023, 6:05 PM

## 2023-02-25 NOTE — H&P (Signed)
 History and Physical    Patient: Steve Thompson FMW:[id number] DOB: 07-09-1931 DOA: 02/25/2023 DOS: the patient was seen and examined on 02/25/2023 PCP: Okey Carlin Redbird, MD  Patient coming from: Home  Chief Complaint:  Chief Complaint  Patient presents with   Code Stroke   HPI: Steve Thompson is a 89 y.o. male with medical history significant of HLD, peripheral neuropathy, arthritis who presented with acute L leg giving out and fall on the morning of presentation. Also complains of increased L shoulder pain. Pt subsequently was brought to ED for Code Stroke. CT head was initially found to be unremarkable Neurology consulted, recommended stroke w/u. MRI was ordered by EDP, currently in progress. [facility] hospitalist consulted for consideration for admission. By then time the pt was seen, pt reported improvement in symptoms and was eager to go home. Denies sob or chest pains. Continues to complain of L shoulder ache   Review of Systems: As mentioned in the history of present illness. All other systems reviewed and are negative. Past Medical History:  Diagnosis Date   Arthritis    Hemorrhoids    Hypercalcemia    Hyperthyroidism    Peripheral neuropathy    Past Surgical History:  Procedure Laterality Date   HEMORRHOID SURGERY     PARATHYROIDECTOMY  10/13/10   Social History:  reports that he has quit smoking. He has never used smokeless tobacco. He reports that he does not drink alcohol  and does not use drugs.  No Known Allergies  Family History  Problem Relation Age of Onset   Cancer Sister        lung    Prior to Admission medications   Medication Sig Start Date End Date Taking? Authorizing Provider  aspirin  81 MG tablet Take 81 mg by mouth daily. Patient also taking a medication for cholesterol, but does not remember name or dosage. Will provide at next visit.    Yes [provider]  gabapentin  (NEURONTIN ) 300 MG capsule Take 300 mg by mouth 4 (four) times daily.   Takes 1 capsule in am  3 capsules at night ( around 7:30p)   Yes [provider]  loratadine  (CLARITIN ) 10 MG tablet 1 tablet Orally Once a day   Yes [provider]  SYNTHROID  88 MCG tablet Take 1 tablet (88 mcg total) by mouth daily. Patient taking differently: Take 100 mcg by mouth daily. 12/28/10 08/27/23 Yes GerkinKrystal, MD  tamsulosin  (FLOMAX ) 0.4 MG CAPS capsule Take 0.4 mg by mouth daily.   Yes [provider]  predniSONE  (DELTASONE ) 2.5 MG tablet Take 1 tablet (2.5 mg total) by mouth daily with breakfast. Patient not taking: Reported on 02/25/2023 11/02/22   Whitfield Raisin, NP  pyridostigmine  (MESTINON ) 60 MG tablet Take 1 tablet (60 mg total) by mouth 3 (three) times daily. Patient not taking: Reported on 02/25/2023 11/02/22   Whitfield Raisin, NP    Physical Exam: Vitals:   02/25/23 1449 02/25/23 1450 02/25/23 1500 02/25/23 1515  BP: (!) 166/80  (!) 150/78 (!) 184/86  Pulse: 68 70 76 70  Resp: 15 (!) 24 (!) 21 19  Temp:      TempSrc:      SpO2: 94% 92% 93% 95%  Weight:       General exam: Awake, laying in bed, in nad Respiratory system: Normal respiratory effort, no wheezing Cardiovascular system: regular rate, s1, s2 Gastrointestinal system: Soft, nondistended, positive BS Central nervous system: CN2-12 grossly intact, strength intact Extremities: Perfused, no clubbing Skin:  Normal skin turgor, no notable skin lesions seen Psychiatry: Mood normal // no visual hallucinations   Data Reviewed:  Labs reviewed: Na 137, K 3.9, Cr 0.80, Hgb 15.0  Assessment and Plan: LLE, possible TIA -Presented with acute LLE weakness resulting in fall -CT head reviewed, unremarkable -Neurology consulted, pending recommendations -For now, will order 2d echo, CTA Head/neck -PT/OT/SLP consulted -Will allow permissive HTN -Would f/u with Neuro regarding antiplatelet recommendations  Elevated BP -will allow permissive HTN for now -PRN hydralazine  for sbp>220,  dbp?110  HLD -Documented history of such -Not on statin PTA on med rec -f/u on lipid panel  Arthritis -complains of L shoulder pain -Will check L shoulder xray   Advance Care Planning:   Code Status: Not on file Full  Consults: Neurology  Family Communication: Pt in room, family at bedside  Severity of Illness: The appropriate patient status for this patient is OBSERVATION. Observation status is judged to be reasonable and necessary in order to provide the required intensity of service to ensure the patient's safety. The patient's presenting symptoms, physical exam findings, and initial radiographic and laboratory data in the context of their medical condition is felt to place them at decreased risk for further clinical deterioration. Furthermore, it is anticipated that the patient will be medically stable for discharge from the hospital within 2 midnights of admission.   Author: Garnette Pelt, MD 02/25/2023 4:01 PM  For on call review [email or web address].

## 2023-02-25 NOTE — ED Provider Notes (Signed)
 [place] EMERGENCY DEPARTMENT AT [facility] Provider Note   CSN: [id number] Arrival date & time: 02/25/23  1416  An emergency department physician performed an initial assessment on this suspected stroke patient at 1415.  History  Chief Complaint  Patient presents with   Code Stroke    Steve Thompson is a 89 y.o. male.  Pt is a 89 yo male with pmhx significant for peripheral neuropathy, hypothyroidism, myasthenia gravis, hld, and bph.  Today around 1100, pt was normal.  Around 1130, he felt some weakness in his left side.  He called EMS when sx did not go away.  EMS called a code stroke en route and he was met at the bridge by the stroke team and myself.         Home Medications Prior to Admission medications   Medication Sig Start Date End Date Taking? Authorizing Provider  aspirin  81 MG tablet Take 81 mg by mouth daily. Patient also taking a medication for cholesterol, but does not remember name or dosage. Will provide at next visit.    Yes [provider]  gabapentin  (NEURONTIN ) 300 MG capsule Take 300 mg by mouth 4 (four) times daily.  Takes 1 capsule in am  3 capsules at night ( around 7:30p)   Yes [provider]  loratadine  (CLARITIN ) 10 MG tablet 1 tablet Orally Once a day   Yes [provider]  SYNTHROID  88 MCG tablet Take 1 tablet (88 mcg total) by mouth daily. Patient taking differently: Take 100 mcg by mouth daily. 12/28/10 08/27/23 Yes GerkinKrystal, MD  tamsulosin  (FLOMAX ) 0.4 MG CAPS capsule Take 0.4 mg by mouth daily.   Yes [provider]  predniSONE  (DELTASONE ) 2.5 MG tablet Take 1 tablet (2.5 mg total) by mouth daily with breakfast. Patient not taking: Reported on 02/25/2023 11/02/22   Whitfield Raisin, NP  pyridostigmine  (MESTINON ) 60 MG tablet Take 1 tablet (60 mg total) by mouth 3 (three) times daily. Patient not taking: Reported on 02/25/2023 11/02/22   Whitfield Raisin, NP      Allergies    Patient has no known  allergies.    Review of Systems   Review of Systems  Neurological:        Left side weakness  All other systems reviewed and are negative.   Physical Exam Updated Vital Signs BP (!) 184/86   Pulse 70   Temp 97.9 F (36.6 C) (Oral)   Resp 19   Wt 88.2 kg   SpO2 95%   BMI 28.71 kg/m  Physical Exam Vitals and nursing note reviewed.  Constitutional:      Appearance: Normal appearance.  HENT:     Head: Normocephalic and atraumatic.     Right Ear: External ear normal.     Left Ear: External ear normal.     Nose: Nose normal.     Mouth/Throat:     Mouth: Mucous membranes are moist.     Pharynx: Oropharynx is clear.  Eyes:     Extraocular Movements: Extraocular movements intact.     Conjunctiva/sclera: Conjunctivae normal.     Pupils: Pupils are equal, round, and reactive to light.  Cardiovascular:     Rate and Rhythm: Normal rate and regular rhythm.     Pulses: Normal pulses.     Heart sounds: Normal heart sounds.  Pulmonary:     Effort: Pulmonary effort is normal.     Breath sounds: Normal breath sounds.  Abdominal:     General:  Abdomen is flat. Bowel sounds are normal.     Palpations: Abdomen is soft.  Musculoskeletal:        General: Normal range of motion.     Cervical back: Normal range of motion and neck supple.  Skin:    General: Skin is warm.     Capillary Refill: Capillary refill takes less than 2 seconds.  Neurological:     General: No focal deficit present.     Mental Status: He is alert.     Comments: Pt is moving all 4 extremities, but per EMS, he was not able to stand up on his left leg   Psychiatric:        Mood and Affect: Mood normal.        Behavior: Behavior normal.     ED Results / Procedures / Treatments   Labs (all labs ordered are listed, but only abnormal results are displayed) Labs Reviewed  DIFFERENTIAL - Abnormal; Notable for the following components:      Result Value   Abs Immature Granulocytes 0.30 (*)    All other components  within normal limits  COMPREHENSIVE METABOLIC PANEL - Abnormal; Notable for the following components:   Glucose, Bld 114 (*)    All other components within normal limits  I-STAT CHEM 8, ED - Abnormal; Notable for the following components:   Glucose, Bld 109 (*)    Calcium , Ion 1.11 (*)    All other components within normal limits  CBG MONITORING, ED - Abnormal; Notable for the following components:   Glucose-Capillary 106 (*)    All other components within normal limits  PROTIME-INR  APTT  CBC  ETHANOL  TSH    EKG EKG Interpretation Date/Time:  Sunday February 25 2023 14:54:48 EST Ventricular Rate:  68 PR Interval:  294 QRS Duration:  145 QT Interval:  447 QTC Calculation: 476 R Axis:   -51  Text Interpretation: Sinus rhythm Prolonged PR interval Nonspecific IVCD with LAD No significant change since last tracing Confirmed by Dean Clarity [phone]) on 02/25/2023 3:02:15 PM  Radiology CT HEAD CODE STROKE WO CONTRAST Result Date: 02/25/2023 CLINICAL DATA:  Code stroke. Neuro deficit, acute, stroke suspected. New onset of left-sided weakness. EXAM: CT HEAD WITHOUT CONTRAST TECHNIQUE: Contiguous axial images were obtained from the base of the skull through the vertex without intravenous contrast. RADIATION DOSE REDUCTION: This exam was performed according to the departmental dose-optimization program which includes automated exposure control, adjustment of the mA and/or kV according to patient size and/or use of iterative reconstruction technique. COMPARISON:  MR head 01/12/2022. FINDINGS: Brain: Mild atrophy and progressive white matter disease is noted bilaterally. No acute infarct, hemorrhage, or mass lesion is present. Basal ganglia are intact. Insular ribbon is normal. The ventricles are of normal size. No significant extraaxial fluid collection is present. The brainstem and cerebellum are within normal limits. Vascular: Atherosclerotic calcifications are present within the cavernous  internal carotid arteries bilaterally. No hyperdense vessel is present. Skull: Calvarium is intact. No focal lytic or blastic lesions are present. No significant extracranial soft tissue lesion is present. Sinuses/Orbits: The paranasal sinuses and mastoid air cells are clear. Right lens replacement is present. Globes and orbits are otherwise within normal limits. ASPECTS [facility] Stroke Program Early CT Score) - Ganglionic level infarction (caudate, lentiform nuclei, internal capsule, insula, M1-M3 cortex): 7/7 - Supraganglionic infarction (M4-M6 cortex): 3/3 Total score (0-10 with 10 being normal): 10/10 IMPRESSION: 1. No acute intracranial abnormality or significant interval change. 2. Mild atrophy and  progressive white matter disease. This likely reflects the sequela of chronic microvascular ischemia. 3. Aspects is 10/10 The above was relayed via text pager to Dr. CAMELLIA SHARK on 02/25/2023 at 14:33 . Electronically Signed   By: Lonni Necessary M.D.   On: 02/25/2023 14:34    Procedures Procedures    Medications Ordered in ED Medications  sodium chloride  flush (NS) 0.9 % injection 3 mL (has no administration in time range)    ED Course/ Medical Decision Making/ A&P                                 Medical Decision Making Amount and/or Complexity of Data Reviewed Labs: ordered. Radiology: ordered.  Risk Decision regarding hospitalization.   This patient presents to the ED for concern of cva, this involves an extensive number of treatment options, and is a complaint that carries with it a high risk of complications and morbidity.  The differential diagnosis includes cva, tia, electrolyte abn, infection   Co morbidities that complicate the patient evaluation  peripheral neuropathy, hypothyroidism, myasthenia gravis, hld, and bph   Additional history obtained:  Additional history obtained from epic chart review External records from outside source obtained and reviewed including  EMS report   Lab Tests:  I Ordered, and personally interpreted labs.  The pertinent results include:  cbc nl, inr 1.0, cmp nl, etoh nl, tsh 1.771   Imaging Studies ordered:  I ordered imaging studies including ct head/mri brain  I independently visualized and interpreted imaging which showed  CT head:  No acute intracranial abnormality or significant interval change.  2. Mild atrophy and progressive white matter disease. This likely  reflects the sequela of chronic microvascular ischemia.  3. Aspects is 10/10  MRI brain is pending at shift change I agree with the radiologist interpretation   Cardiac Monitoring:  The patient was maintained on a cardiac monitor.  I personally viewed and interpreted the cardiac monitored which showed an underlying rhythm of: nsr   Medicines ordered and prescription drug management:   I have reviewed the patients home medicines and have made adjustments as needed   Test Considered:  mri   Critical Interventions:  Code stroke   Consultations Obtained:  I requested consultation with the neurologist (Dr. Shark),  and discussed lab and imaging findings as well as pertinent plan -he recommends admission for stroke work up Pt d/w Dr. Laurence ([facility]) for admission   Problem List / ED Course:  CVA:  pt's sx are not severe.  He was offered TNK, but declines.  He does not have a LVO, so cta was not done.  Pt will need admission for stroke work up.   Reevaluation:  After the interventions noted above, I reevaluated the patient and found that they have :improved   Social Determinants of Health:  Lives at home   Dispostion:  After consideration of the diagnostic results and the patients response to treatment, I feel that the patent would benefit from discharge with outpatient f/u.          Final Clinical Impression(s) / ED Diagnoses Final diagnoses:  Cerebrovascular accident (CVA), unspecified mechanism (HCC)    Rx / DC  Orders ED Discharge Orders     None         Dean Clarity, MD 02/25/23 1549

## 2023-02-26 DIAGNOSIS — I639 Cerebral infarction, unspecified: Secondary | ICD-10-CM | POA: Diagnosis not present

## 2023-02-26 DIAGNOSIS — G459 Transient cerebral ischemic attack, unspecified: Secondary | ICD-10-CM | POA: Diagnosis not present

## 2023-02-26 LAB — ECHOCARDIOGRAM COMPLETE
S' Lateral: 2.7 cm
Weight: 3111.13 [oz_av]

## 2023-02-26 LAB — HEMOGLOBIN A1C
Hgb A1c MFr Bld: 5.7 % — ABNORMAL HIGH (ref 4.8–5.6)
Mean Plasma Glucose: 116.89 mg/dL

## 2023-02-26 LAB — CBG MONITORING, ED: Glucose-Capillary: 91 mg/dL (ref 70–99)

## 2023-02-26 LAB — LIPID PANEL
Cholesterol: 187 mg/dL (ref 0–200)
HDL: 43 mg/dL (ref >40)
LDL Cholesterol: 126 mg/dL — ABNORMAL HIGH (ref 0–99)
Total CHOL/HDL Ratio: 4.3 {ratio}
Triglycerides: 91 mg/dL (ref <150)
VLDL: 18 mg/dL (ref 0–40)

## 2023-02-26 MED ORDER — ROSUVASTATIN CALCIUM 20 MG PO TABS
20.0000 mg | ORAL_TABLET | Freq: Every day | ORAL | Status: DC
  Administered 2023-02-27 – 2023-02-28 (×2): 20 mg via ORAL
  Filled 2023-02-26 (×2): qty 1

## 2023-02-26 MED ORDER — PNEUMOCOCCAL 20-VAL CONJ VACC 0.5 ML IM SUSY
0.5000 mL | PREFILLED_SYRINGE | INTRAMUSCULAR | Status: DC
  Filled 2023-02-26: qty 0.5

## 2023-02-26 MED ORDER — IBUPROFEN 400 MG PO TABS
400.0000 mg | ORAL_TABLET | Freq: Once | ORAL | Status: AC
  Administered 2023-02-26: 400 mg via ORAL
  Filled 2023-02-26: qty 1

## 2023-02-26 NOTE — Progress Notes (Addendum)
 STROKE TEAM PROGRESS NOTE   INTERIM HISTORY/SUBJECTIVE Patient was admitted with left facial droop and left-sided weakness and was offered TNK but elected not to receive it due to risk for ICH.  MRI reveals right thalamocapsular infarct.  He has been hemodynamically stable and afebrile overnight and is awaiting evaluation by PT/OT. Very mild left facial and leg weakness persist.  Blood pressure adequately controlled.  Vital signs stable  OBJECTIVE  CBC    Component Value Date/Time   WBC 9.2 02/25/2023 1420   RBC 4.99 02/25/2023 1420   HGB 15.0 02/25/2023 1426   HGB 15.1 08/23/2022 1032   HCT 44.0 02/25/2023 1426   HCT 44.7 08/23/2022 1032   PLT 314 02/25/2023 1420   PLT 291 08/23/2022 1032   MCV 88.6 02/25/2023 1420   MCV 91 08/23/2022 1032   MCH 30.3 02/25/2023 1420   MCHC 34.2 02/25/2023 1420   RDW 12.7 02/25/2023 1420   RDW 12.9 08/23/2022 1032   LYMPHSABS 1.3 02/25/2023 1420   LYMPHSABS 0.8 08/23/2022 1032   MONOABS 0.8 02/25/2023 1420   EOSABS 0.2 02/25/2023 1420   EOSABS 0.1 08/23/2022 1032   BASOSABS 0.1 02/25/2023 1420   BASOSABS 0.1 08/23/2022 1032    BMET    Component Value Date/Time   NA 137 02/25/2023 1426   NA 140 08/23/2022 1032   K 3.9 02/25/2023 1426   CL 102 02/25/2023 1426   CO2 25 02/25/2023 1420   GLUCOSE 109 (H) 02/25/2023 1426   BUN 15 02/25/2023 1426   BUN 17 08/23/2022 1032   CREATININE 0.80 02/25/2023 1426   CALCIUM  9.6 02/25/2023 1420   CALCIUM  9.5 12/27/2010 0000   EGFR 77 08/23/2022 1032   GFRNONAA >60 02/25/2023 1420    IMAGING past 24 hours CT ANGIO HEAD NECK W WO CM Result Date: 02/25/2023 CLINICAL DATA:  Initial evaluation for acute stroke. EXAM: CT ANGIOGRAPHY HEAD AND NECK WITH AND WITHOUT CONTRAST TECHNIQUE: Multidetector CT imaging of the head and neck was performed using the standard protocol during bolus administration of intravenous contrast. Multiplanar CT image reconstructions and MIPs were obtained to evaluate the vascular  anatomy. Carotid stenosis measurements (when applicable) are obtained utilizing NASCET criteria, using the distal internal carotid diameter as the denominator. RADIATION DOSE REDUCTION: This exam was performed according to the departmental dose-optimization program which includes automated exposure control, adjustment of the mA and/or kV according to patient size and/or use of iterative reconstruction technique. CONTRAST:  75mL OMNIPAQUE  IOHEXOL  350 MG/ML SOLN COMPARISON:  CT and MRI from earlier the same day. FINDINGS: CTA NECK FINDINGS Aortic arch: Visualized aortic arch within normal limits for caliber with standard 3 vessel morphology. Moderate to advanced atheromatous change about the arch and origin of the great vessels without hemodynamically significant stenosis. Right carotid system: Right common and internal carotid arteries are tortuous but patent without dissection. Moderate calcified plaque about the right carotid bulb with up to 45% stenosis by NASCET criteria. Left carotid system: Left common and internal carotid arteries are tortuous but patent without dissection. Bulky calcified plaque about the left carotid bulb with associated stenosis of up to 50% by NASCET criteria. Vertebral arteries: Both vertebral arteries arise from subclavian arteries. No significant proximal subclavian artery stenosis. Soft plaque at the origin of the right vertebral artery with moderate stenosis. Vertebral arteries are tortuous but patent distally without stenosis or dissection. Skeleton: No worrisome osseous lesions. Moderate cervical spondylosis, most pronounced at C6-7. Other neck: No other acute finding. Upper chest: No other acute  finding. Review of the MIP images confirms the above findings CTA HEAD FINDINGS Anterior circulation: Moderate atheromatous change about the carotid siphons with up to mild stenoses bilaterally. A1 segments, anterior communicating artery complex, and anterior cerebral arteries widely  patent. No M1 stenosis or occlusion. Distal MCA branches perfused and symmetric. Posterior circulation: Both V4 segments widely patent. Both PICA patent. Basilar patent without stenosis. Superior cerebellar and posterior cerebral arteries patent bilaterally. Venous sinuses: Patent allowing for timing the contrast bolus. Anatomic variants: None significant.  No aneurysm. Review of the MIP images confirms the above findings IMPRESSION: 1. Negative CTA for large vessel occlusion or other emergent finding. 2. Moderate atheromatous change about the carotid bifurcations with associated stenoses of up to 45% on the right and 50% on the left. 3. Moderate atheromatous change about the carotid siphons with up to mild stenoses bilaterally. 4. Diffuse tortuosity of the major arterial vasculature of the head and neck, suggesting chronic underlying hypertension. 5.  Aortic Atherosclerosis (ICD10-I70.0). Electronically Signed   By: Virgia Griffins M.D.   On: 02/25/2023 20:29   MR BRAIN WO CONTRAST Result Date: 02/25/2023 CLINICAL DATA:  Initial evaluation for acute neuro deficit, stroke suspected. EXAM: MRI HEAD WITHOUT CONTRAST TECHNIQUE: Multiplanar, multiecho pulse sequences of the brain and surrounding structures were obtained without intravenous contrast. COMPARISON:  Prior CTs from earlier the same day. FINDINGS: Brain: Generalized age-related cerebral atrophy. Patchy T2/FLAIR hyperintensity involving the periventricular deep white matter both cerebral hemispheres, consistent with chronic small vessel ischemic disease. Overall, changes are mild for age. 1.3 cm focus of restricted diffusion seen involving the right thalamocapsular region, consistent with an acute ischemic infarct. No associated hemorrhage or mass effect. No other evidence for acute or subacute ischemia. Gray-white matter differentiation otherwise maintained. No acute or chronic intracranial blood products. No mass lesion, midline shift or mass effect.  No hydrocephalus or extra-axial fluid collection. Pituitary gland suprasellar region within normal limits. Vascular: Major intracranial vascular flow voids are maintained. Skull and upper cervical spine: Craniocervical junction within normal limits. Bone marrow signal intensity normal. No scalp soft tissue abnormality. Sinuses/Orbits: Prior ocular lens replacement on the right. Scattered mucosal thickening present throughout the paranasal sinuses. No mastoid effusion. Other: None. IMPRESSION: 1. 1.3 cm acute ischemic nonhemorrhagic right thalamocapsular infarct. 2. Underlying age-related cerebral atrophy with mild chronic small vessel ischemic disease. Electronically Signed   By: Virgia Griffins M.D.   On: 02/25/2023 19:13   DG Shoulder Left Result Date: 02/25/2023 CLINICAL DATA:  Left shoulder pain. EXAM: LEFT SHOULDER - 2+ VIEW COMPARISON:  None Available. FINDINGS: There is no acute fracture or dislocation. The bones are osteopenic. There is arthritic changes of the left shoulder with spurring of the left AC joint. The soft tissues are unremarkable. Atherosclerotic calcification of the aorta. IMPRESSION: 1. No acute fracture or dislocation. 2. Arthritic changes of the left shoulder. Electronically Signed   By: Angus Bark M.D.   On: 02/25/2023 17:18   CT HEAD CODE STROKE WO CONTRAST Result Date: 02/25/2023 CLINICAL DATA:  Code stroke. Neuro deficit, acute, stroke suspected. New onset of left-sided weakness. EXAM: CT HEAD WITHOUT CONTRAST TECHNIQUE: Contiguous axial images were obtained from the base of the skull through the vertex without intravenous contrast. RADIATION DOSE REDUCTION: This exam was performed according to the departmental dose-optimization program which includes automated exposure control, adjustment of the mA and/or kV according to patient size and/or use of iterative reconstruction technique. COMPARISON:  MR head 01/12/2022. FINDINGS: Brain: Mild atrophy and progressive white  matter disease is noted bilaterally. No acute infarct, hemorrhage, or mass lesion is present. Basal ganglia are intact. Insular ribbon is normal. The ventricles are of normal size. No significant extraaxial fluid collection is present. The brainstem and cerebellum are within normal limits. Vascular: Atherosclerotic calcifications are present within the cavernous internal carotid arteries bilaterally. No hyperdense vessel is present. Skull: Calvarium is intact. No focal lytic or blastic lesions are present. No significant extracranial soft tissue lesion is present. Sinuses/Orbits: The paranasal sinuses and mastoid air cells are clear. Right lens replacement is present. Globes and orbits are otherwise within normal limits. ASPECTS [facility] Stroke Program Early CT Score) - Ganglionic level infarction (caudate, lentiform nuclei, internal capsule, insula, M1-M3 cortex): 7/7 - Supraganglionic infarction (M4-M6 cortex): 3/3 Total score (0-10 with 10 being normal): 10/10 IMPRESSION: 1. No acute intracranial abnormality or significant interval change. 2. Mild atrophy and progressive white matter disease. This likely reflects the sequela of chronic microvascular ischemia. 3. Aspects is 10/10 The above was relayed via text pager to Dr. Kimberley Penman on 02/25/2023 at 14:33 . Electronically Signed   By: Audree Leas M.D.   On: 02/25/2023 14:34    Vitals:   02/26/23 0733 02/26/23 1000 02/26/23 1120 02/26/23 1220  BP: (!) 144/87 (!) 167/92  (!) 152/100  Pulse: 95 92  100  Resp: 18 17  17   Temp: 99.1 F (37.3 C)  99 F (37.2 C) 98.9 F (37.2 C)  TempSrc: Oral   Oral  SpO2: 97% 95%  96%  Weight:      Height:         PHYSICAL EXAM General:  Alert, well-nourished, well-developed pleasant elderly Caucasian male in no acute distress Psych:  Mood and affect appropriate for situation CV: Regular rate and rhythm on monitor Respiratory:  Regular, unlabored respirations on room air   NEURO:  Mental Status:  AA&Ox3, patient is able to give clear and coherent history Speech/Language: speech is without dysarthria or aphasia.    Cranial Nerves:  II: PERRL. Visual fields full.  III, IV, VI: EOMI. Eyelids elevate symmetrically.  V: Sensation is intact to light touch and symmetrical to face.  VII: Left-sided facial droop VIII: hearing intact to voice. IX, X: Phonation is normal.  XII: tongue is midline without fasciculations. Motor: Able to move all 4 extremities with good antigravity strength, but left hand grip weaker than right, diminished fine finger movements look noted to the left hand, right hand orbits left, and subtle drift noted in the left leg Tone: is normal and bulk is normal Sensation- Intact to light touch bilaterally.  Coordination: FTN intact bilaterally Gait- deferred  Most Recent NIH  1a Level of Conscious.: 0 1b LOC Questions: 0 1c LOC Commands: 0 2 Best Gaze: 0 3 Visual: 0 4 Facial Palsy: 1 5a Motor Arm - left: 0 5b Motor Arm - Right: 0 6a Motor Leg - Left: 1 6b Motor Leg - Right: 0 7 Limb Ataxia: 0 8 Sensory: 0 9 Best Language: 0 10 Dysarthria: 0 11 Extinct. and Inatten.: 0 TOTAL: 2   ASSESSMENT/PLAN  Steve Thompson is a 89 y.o. male with history of arthritis, myasthenia gravis, hypothyroidism and peripheral neuropathy admitted for acute onset left leg weakness and facial droop.  NIH on Admission 3.  He was found to have a right thalamocapsular infarct on MRI.  He was offered TNK but declined due to risk for hemorrhage.  Acute Ischemic Infarct:  right thalamocapsular infarct Etiology: Small vessel disease Code  Stroke CT head No acute abnormality. Small vessel disease. Atrophy. ASPECTS 10.    CTA head & neck no LVO, 45% stenosis in right carotid, 50% stenosis in left carotid, diffuse tortuosity of major arterial vasculature of the head and neck MRI acute ischemic right thalamocapsular infarct, atrophy and small vessel ischemic disease 2D Echo pending LDL  126 HgbA1c 5.7 VTE prophylaxis -Lovenox  aspirin  81 mg daily prior to admission, now on aspirin  81 mg daily and clopidogrel  75 mg daily for 3 weeks and then Plavix  alone. Therapy recommendations:  CIR Disposition: Pending, likely CIR  Hypertension Home meds: None Stable Blood Pressure Goal: BP less than 220/110   Hyperlipidemia Home meds: None LDL 126, goal < 70 Add rosuvastatin  20 mg daily Continue statin at discharge  Other Stroke Risk Factors Advanced age   Other Active Problems Myasthenia gravis-patient currently taking no medications for this at home, will restart if necessary Hypothyroidism-continue home Synthroid   Hospital day # 0  Patient was seen by NP with MD, MD to edit note as needed. Cortney E Bucky Cardinal , MSN, AGACNP-BC [facility] See Amion for schedule and pager information 02/26/2023 12:39 PM   STROKE MD NOTE :  I have personally obtained history,examined this patient, reviewed notes, independently viewed imaging studies, participated in medical decision making and plan of care.ROS completed by me personally and pertinent positives fully documented  I have made any additions or clarifications directly to the above note. Agree with note above.  Patient presented with sudden onset of left facial and leg weakness and was offered TNK on admission but refused after discussion about risk-benefit.  MRI confirms moderate right thalamic infarct due to small vessel disease.  Recommend aspirin  and Plavix  for 3 weeks followed by Plavix  alone.  Aggressive risk factor modification.  Mobilize out of bed.  Therapy consults.  Long discussion with patient and answered questions.  Greater than 50% time during this 50-minute visit was spent in counseling and coordination of care and discussion with patient and care team and answering questions.  Ardella Beaver, MD Medical Director [facility] Stroke Center Pager: [phone] 02/26/2023 5:30 PM   To contact Stroke  Continuity provider, please refer to [email or web address]. After hours, contact General Neurology

## 2023-02-26 NOTE — Evaluation (Signed)
 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 male who presents from home via EMS as a Code Stroke. MRI 2/9: "1.3 cm acute ischemic nonhemorrhagic right thalamocapsular infarct." Pt with a PMHx of Arthritis, Hemorrhoids, Hypercalcemia, Hyperthyroidism, and Peripheral neuropathy   Assessment / Plan / Recommendation Clinical Impression  Pt presents with cognitive linguistic defcits.  He was assessed using the SLUMS and scored 16/30 suggestive of a major neurocognitive impairment.  His baseline level of function is unknown.  He denies any deficits related to cognition, and in fact, any changes since his stroke.  Pt exhibited impairments with memory in both word (3/5 but benefited from category cues) and narrative (2/8) recall tasks.  Pt also exhibited deficits in visuospatial processing.  Pt placed majority of numbers on R side of clock face. He seemed to recognize that the number did not reach all the way around but did not attempt to correct.  Hour hand was placed pointing in the general direction of the number where he had placed it on the clock.  Minute  hand was placed pointing to incorrect number, but perhaps near where the number should have been placed.  He also demonstrated significant difficulty finding shapes for figure tasks and required repeated visual cues.  Once he was able to locate the figures, he answered questions about them accurately.  Pt completed calculations questions correctly with only 1 repetition of prompt.  He answered orientation questions accurately.  He was able to generate 12 items in a category within a monute and he did not exhibit any difficulty with word finding in conversation and his speech is clear and without dysarthria. Pt seemingly unaware of deficits.  He would benefit from ongoing ST in house and at next level of care.  Consider IPR referral.    SLP Assessment  SLP Recommendation/Assessment: Patient needs continued Speech Lanaguage Pathology Services SLP Visit Diagnosis: Cognitive communication deficit (R41.841)    Recommendations for follow up therapy are one component of a multi-disciplinary discharge planning process, led by the attending physician.  Recommendations may be updated based on patient status, additional functional criteria and insurance authorization.    Follow Up Recommendations  Acute inpatient rehab (3hours/day)    Assistance Recommended at Discharge  Frequent or constant Supervision/Assistance  Functional Status Assessment Patient has had a recent decline in their functional status and demonstrates the ability to make significant improvements in function in a reasonable and predictable amount of time.  Frequency and Duration min 2x/week  2 weeks      SLP Evaluation Cognition  Overall Cognitive Status: Impaired/Different from baseline Arousal/Alertness: Awake/alert Orientation Level: Oriented X4 Year: 2025 Month: February Day of Week:  Correct Attention: Focused;Sustained Focused Attention: Appears intact Sustained Attention: Impaired Sustained Attention Impairment: Verbal  basic Memory: Impaired Memory Impairment: Decreased short term memory Decreased Short Term Memory: Verbal basic Awareness: Impaired Executive Function: Organizing Organizing: Impaired       Comprehension  Auditory Comprehension Overall Auditory Comprehension: Appears within functional limits for tasks assessed Conversation: Simple Visual Recognition/Discrimination Discrimination: Not tested Reading Comprehension Reading Status: Not tested    Expression Expression Primary Mode of Expression: Verbal Verbal Expression Overall Verbal Expression: Appears within functional limits for tasks assessed Repetition: No impairment Naming: No impairment Written Expression Dominant Hand: Right Written Expression: Not tested   Oral / Motor  Motor Speech Overall Motor Speech: Appears within functional limits for tasks assessed Respiration: Within functional limits Phonation: Normal Resonance: Within functional limits Articulation: Within functional limitis Intelligibility: Intelligible Motor Planning: Witnin functional limits Motor Speech Errors: Not applicable            Elester Grim, MA, CCC-SLP Acute Rehabilitation Services Office: [phone] 02/26/2023, 10:54 AM

## 2023-02-26 NOTE — Evaluation (Signed)
 Physical Therapy Evaluation Patient Details Name: Steve Thompson MRN: [id number] DOB: 09-Nov-1931 Today's Date: 02/26/2023  History of Present Illness  89 yo male admitted 2/9 found to have right thalamocapsular Infarct and left shoulder pain but negative for fx. PMH: peripheral neuropathy, hypothyroidism, myasthenia gravis, hld, and bph.  Clinical Impression  Pt admitted with above diagnosis. Pt was able to ambulate with PT however needing min assist as pt with left LE having decr step length worsening as he fatigued needing cues for safety.  Pt also needing min assit for LOB as he approached chair with left LE "giving out".  Pt with poor awareness of deficit and poor insight regarding safety.  Pt will need assist at d/c and would benefit from post acute rehab > 3hours day to return to prior functional level.  Pt currently with functional limitations due to the deficits listed below (see PT Problem List). Pt will benefit from acute skilled PT to increase their independence and safety with mobility to allow discharge.           If plan is discharge home, recommend the following: A little help with walking and/or transfers;Assistance with cooking/housework;Assist for transportation;Help with stairs or ramp for entrance   Can travel by private vehicle        Equipment Recommendations Other (comment) (issued gait belt)  Recommendations for Other Services  Rehab consult    Functional Status Assessment Patient has had a recent decline in their functional status and demonstrates the ability to make significant improvements in function in a reasonable and predictable amount of time.     Precautions / Restrictions Precautions Precautions: Fall Restrictions Weight Bearing Restrictions Per Provider Order: No      Mobility  Bed Mobility Overal bed mobility: Needs Assistance Bed Mobility: Sit to Supine       Sit to supine: Min assist   General bed mobility comments: Pt had difficulty  getting his left LE onto the bed. Needed incr time and assist.    Transfers Overall transfer level: Needs assistance Equipment used: Rolling walker (2 wheels), None, 1 person hand held assist Transfers: Sit to/from Stand Sit to Stand: Min assist           General transfer comment: Pt was able to stand with CGA with use of momentum with definite need for hands to push up. Pt did have LOB upon return to room to sit down on chair losing balance as left LE "gave out" when pt turning to sit on chair and had to catch pt and assist him to chair with min assist for safety. Pt unaware and with definite need for assist to safely sit.    Ambulation/Gait Ambulation/Gait assistance: Min assist Gait Distance (Feet): 75 Feet Assistive device: Rolling walker (2 wheels), 1 person hand held assist, None Gait Pattern/deviations: Step-through pattern, Decreased step length - left, Decreased stance time - left, Decreased weight shift to left, Decreased dorsiflexion - left, Staggering left   Gait velocity interpretation: 1.31 - 2.62 ft/sec, indicative of limited community ambulator   General Gait Details: Pt was able to ambulate needing 1 UE support initially for support on right UE. As pt fatigues, left LE with decr step length needing cues as pt unaware that left LE lagging behind. Pt continues to ambulate somewhat impulsively with lack of awareness of left LE weakness needing incr assist for safety.  LOB on return to chair.  Definitely recommend that pt have someone walk wiht him and use RW initially and issued  gait belt as well. Pt unsafe to ambulate on his own.  Stairs            Wheelchair Mobility     Tilt Bed    Modified Rankin (Stroke Patients Only)       Balance Overall balance assessment: Needs assistance Sitting-balance support: No upper extremity supported, Feet supported Sitting balance-Leahy Scale: Fair     Standing balance support: Bilateral upper extremity supported, No  upper extremity supported, Single extremity supported, During functional activity Standing balance-Leahy Scale: Poor Standing balance comment: Pt requires at least 1 UE support with static stance and needed bil UE support and min assist for safety with dynamic activity.                             Pertinent Vitals/Pain Pain Assessment Pain Assessment: Faces Faces Pain Scale: Hurts even more Pain Location: left shoulder Pain Descriptors / Indicators: Grimacing, Guarding, Discomfort Pain Intervention(s): Limited activity within patient's tolerance, Monitored during session, Repositioned    Home Living Family/patient expects to be discharged to:: Private residence Living Arrangements: Spouse/significant other Available Help at Discharge: Family;Available 24 hours/day Type of Home: Other(Comment) (town home) Home Access: Stairs to enter Entrance Stairs-Rails: Right Entrance Stairs-Number of Steps: 2   Home Layout: One level Home Equipment: Agricultural consultant (2 wheels);Cane - single point;Cane - quad;Rollator (4 wheels);BSC/3in1;Shower seat;Grab bars - toilet;Grab bars - tub/shower;Hand held shower head      Prior Function Prior Level of Function : Independent/Modified Independent (wife drives)             Mobility Comments: was not using device prior to admit ADLs Comments: B/D self     Extremity/Trunk Assessment   Upper Extremity Assessment Upper Extremity Assessment: Defer to OT evaluation    Lower Extremity Assessment Lower Extremity Assessment: LLE deficits/detail LLE Deficits / Details: hip 3-/5, ankle DF 3-/5, ankle PF 3/5, knee 3+/5 LLE Sensation: decreased proprioception LLE Coordination: decreased gross motor    Cervical / Trunk Assessment Cervical / Trunk Assessment: Normal  Communication   Communication Communication: No apparent difficulties Cueing Techniques: Verbal cues;Tactile cues  Cognition Arousal: Alert Behavior During Therapy: WFL for  tasks assessed/performed Overall Cognitive Status: Impaired/Different from baseline Area of Impairment: Attention, Following commands, Safety/judgement, Awareness, Problem solving                       Following Commands: Follows one step commands consistently Safety/Judgement: Decreased awareness of safety, Decreased awareness of deficits   Problem Solving: Slow processing General Comments: Poor safety awareness as he lacks awareness of deficits on left hemibody. Pt also with delayed response time in answering questions PT asked.  Possible left inattention.        General Comments General comments (skin integrity, edema, etc.): 92-97 bpm, 95% RA, 158/97 pre BP. 139/87 post BP    Exercises General Exercises - Lower Extremity Long Arc Quad: AROM, Both, 10 reps, Seated Hip Flexion/Marching: AROM, Both, 10 reps, Seated   Assessment/Plan    PT Assessment Patient needs continued PT services  PT Problem List Decreased balance;Decreased activity tolerance;Decreased mobility;Decreased strength;Decreased coordination;Decreased knowledge of use of DME;Decreased safety awareness;Decreased knowledge of precautions       PT Treatment Interventions DME instruction;Gait training;Functional mobility training;Therapeutic activities;Therapeutic exercise;Balance training;Patient/family education;Stair training    PT Goals (Current goals can be found in the Care Plan section)  Acute Rehab PT Goals Patient Stated Goal: to go home PT  Goal Formulation: With patient Time For Goal Achievement: 03/12/23 Potential to Achieve Goals: Good    Frequency Min 1X/week     Co-evaluation               AM-PAC PT "6 Clicks" Mobility  Outcome Measure Help needed turning from your back to your side while in a flat bed without using bedrails?: A Little Help needed moving from lying on your back to sitting on the side of a flat bed without using bedrails?: A Little Help needed moving to and from  a bed to a chair (including a wheelchair)?: A Little Help needed standing up from a chair using your arms (e.g., wheelchair or bedside chair)?: A Little Help needed to walk in hospital room?: A Lot Help needed climbing 3-5 steps with a railing? : Total 6 Click Score: 15    End of Session Equipment Utilized During Treatment: Gait belt Activity Tolerance: Patient limited by fatigue Patient left: with call bell/phone within reach (on stretcher) Nurse Communication: Mobility status (safety issues and that pt was left in stretcher with side rails up for safety) PT Visit Diagnosis: Unsteadiness on feet (R26.81);Muscle weakness (generalized) (M62.81)    Time: 8119-1478 PT Time Calculation (min) (ACUTE ONLY): 19 min   Charges:   PT Evaluation $PT Eval Moderate Complexity: 1 Mod   PT General Charges $$ ACUTE PT VISIT: 1 Visit         Aryon Nham M,PT Acute Rehab Services [phone]   Florencia Hunter 02/26/2023, 10:21 AM

## 2023-02-26 NOTE — Evaluation (Signed)
 Occupational Therapy Evaluation Patient Details Name: Steve Thompson MRN: [id number] DOB: [birth year] Today's Date: 02/26/2023   History of Present Illness 89 yo male admitted 2/9 found to have right thalamocapsular Infarct and left shoulder pain but negative for fx. PMH: peripheral neuropathy, hypothyroidism, myasthenia gravis, hld, and bph.   Clinical Impression   Pt currently at min assist level for selfcare and transfers to and from the elevated toilet with rail.  Pt with decreased awareness of deficits with increased LOB to the right during turning in the bathroom, requiring min assist from therapist to correct.  Increased left shoulder pain with slight weakness at 3+/5.  Elbow and grip WFLS.  Slight decreased speed of coordination with finger to nose when compared to the right.  Prior to admission pt was living with her spouse who is recovering from respiratory illness currently.  Per his report, he was independent with all ADLs and selfcare tasks but his wife would take care of the driving.  Feel he will benefit from acute care OT at this time to help progress safety and independence with selfcare tasks.  Feel short term  intensive inpatient follow-up therapy, >3 hours/day would help increase independence further and reduce caregiver burden at home.  Per granddaughter's report family is setting up initial 24 hour assist to help spouse recover and pt when discharged home.          If plan is discharge home, recommend the following: A little help with walking and/or transfers;A little help with bathing/dressing/bathroom;Assistance with cooking/housework;Assist for transportation;Help with stairs or ramp for entrance;Supervision due to cognitive status;Direct supervision/assist for medications management    Functional Status Assessment  Patient has had a recent decline in their functional status and demonstrates the ability to make significant improvements in function in a reasonable and  predictable amount of time.  Equipment Recommendations  Other (comment) (TBD next visit or next venue of care)       Precautions / Restrictions Precautions Precautions: Fall Restrictions Weight Bearing Restrictions Per Provider Order: No      Mobility Bed Mobility Overal bed mobility: Needs Assistance Bed Mobility: Sit to Supine, Supine to Sit     Supine to sit: Contact guard Sit to supine: Contact guard assist   General bed mobility comments: Pt had difficulty getting his left LE onto the bed. Needed incr time to complete.    Transfers Overall transfer level: Needs assistance Equipment used: None Transfers: Sit to/from Stand, Bed to chair/wheelchair/BSC Sit to Stand: Min assist     Step pivot transfers: Min assist     General transfer comment: Increased LOB to the right with  turning to transfer to the toilet without assistive device or use of rails.      Balance Overall balance assessment: Needs assistance Sitting-balance support: No upper extremity supported, Feet supported Sitting balance-Leahy Scale: Fair Sitting balance - Comments: Increased posterior lean in sitting while working on dressing.   Standing balance support: Bilateral upper extremity supported, No upper extremity supported, Single extremity supported, During functional activity Standing balance-Leahy Scale: Poor Standing balance comment: Pt with need for UE support for balance during mobility.                           ADL either performed or assessed with clinical judgement   ADL Overall ADL's : Needs assistance/impaired Eating/Feeding: Independent;Sitting   Grooming: Wash/dry hands;Wash/dry face;Standing;Minimal assistance   Upper Body Bathing: Supervision/ safety;Sitting   Lower Body Bathing:  Minimal assistance;Sit to/from stand   Upper Body Dressing : Supervision/safety;Sitting   Lower Body Dressing: Minimal assistance;Sit to/from stand   Toilet Transfer: Minimal  assistance;Ambulation;Regular Toilet   Toileting- Clothing Manipulation and Hygiene: Minimal assistance;Sit to/from stand       Functional mobility during ADLs: Minimal assistance (ambulation without assistive device) General ADL Comments: Pt with increased LOB to the left when turning to sit on the toilet without assistive device.  Pt with decreased awareness of balance deficits and increased fall risk.  Pt's granddaughter in at end of session and reports that pt's wife is recoveing from PNA currently and family will be helping provide initial supervision post discharge.     Vision Baseline Vision/History: 0 No visual deficits (Pt reports increased blurriness with eyes watering) Ability to See in Adequate Light: 0 Adequate Patient Visual Report: No change from baseline Vision Assessment?: Yes Eye Alignment: Within Functional Limits Ocular Range of Motion: Within Functional Limits Tracking/Visual Pursuits: Able to track stimulus in all quads without difficulty Convergence: Within functional limits Visual Fields: No apparent deficits (pt able to avoid objects in the hallway and scored 100/5 on line bisection testing.  With confrontational testing, pt kept turning his eyes, so difficult to accurately assess.)     Perception Perception: Within Functional Limits       Praxis Praxis: [facility]       Pertinent Vitals/Pain Pain Assessment Pain Assessment: Faces Faces Pain Scale: Hurts a little bit Pain Location: left shoulder Pain Descriptors / Indicators: Discomfort Pain Intervention(s): Limited activity within patient's tolerance     Extremity/Trunk Assessment Upper Extremity Assessment Upper Extremity Assessment: LUE deficits/detail LUE Deficits / Details: Shoulder flexion AROM 0-100 degrees, AAROM 0-130 degrees with some pain at glenohumeral joint.  Elbow 4/5, grip 4/5, LUE Sensation: WNL LUE Coordination:  (slight decreased speed with finger to nose compared to the right)   Lower  Extremity Assessment Lower Extremity Assessment: Defer to PT evaluation LLE Deficits / Details: hip 3-/5, ankle DF 3-/5, ankle PF 3/5, knee 3+/5 LLE Sensation: decreased proprioception LLE Coordination: decreased gross motor   Cervical / Trunk Assessment Cervical / Trunk Assessment: Normal   Communication Communication Communication: No apparent difficulties Cueing Techniques: Verbal cues;Tactile cues   Cognition Arousal: Alert Behavior During Therapy: WFL for tasks assessed/performed Overall Cognitive Status: No family/caregiver present to determine baseline cognitive functioning Area of Impairment: Safety/judgement, Awareness, Memory                     Memory: Decreased short-term memory Following Commands: Follows one step commands consistently, Follows multi-step commands with increased time Safety/Judgement: Decreased awareness of safety, Decreased awareness of deficits Awareness: Anticipatory Problem Solving: Requires verbal cues General Comments: Pt with decreased awareness of balance deficits when asked.  Only able to recall 1/3 words after approximately 2 min delay.  No words after 15 min delay, using the same words.     General Comments  92-97 bpm, 95% RA, 158/97 pre BP. 139/87 post BP            Home Living Family/patient expects to be discharged to:: Private residence Living Arrangements: Spouse/significant other Available Help at Discharge: Family;Available 24 hours/day Type of Home: Other(Comment) (town home) Home Access: Stairs to enter Secretary/administrator of Steps: 2 Entrance Stairs-Rails: Right Home Layout: One level     Bathroom Shower/Tub: Chief Strategy Officer: Standard     Home Equipment: Agricultural consultant (2 wheels);Cane - single point;Cane - quad;Rollator (4 wheels);BSC/3in1;Shower seat;Grab bars -  toilet;Grab bars - tub/shower;Hand held shower head          Prior Functioning/Environment Prior Level of Function :  Independent/Modified Independent (wife drives)             Mobility Comments: was not using device prior to admit ADLs Comments: B/D self        OT Problem List: Decreased strength;Decreased knowledge of use of DME or AE;Decreased activity tolerance;Cardiopulmonary status limiting activity;Decreased safety awareness;Impaired balance (sitting and/or standing);Pain      OT Treatment/Interventions: Self-care/ADL training;Balance training;Patient/family education;Therapeutic activities;DME and/or AE instruction;Cognitive remediation/compensation;Neuromuscular education;Therapeutic exercise    OT Goals(Current goals can be found in the care plan section) Acute Rehab OT Goals Patient Stated Goal: Pt hopeful to go home soon. OT Goal Formulation: With patient Time For Goal Achievement: 03/12/23 Potential to Achieve Goals: Good  OT Frequency: Min 1X/week       AM-PAC OT "6 Clicks" Daily Activity     Outcome Measure Help from another person eating meals?: None Help from another person taking care of personal grooming?: A Little Help from another person toileting, which includes using toliet, bedpan, or urinal?: A Little Help from another person bathing (including washing, rinsing, drying)?: A Little Help from another person to put on and taking off regular upper body clothing?: A Little Help from another person to put on and taking off regular lower body clothing?: A Little 6 Click Score: 19   End of Session Equipment Utilized During Treatment: Gait belt Nurse Communication: Mobility status  Activity Tolerance: Patient tolerated treatment well Patient left: in bed;with call bell/phone within reach;with family/visitor present  OT Visit Diagnosis: Unsteadiness on feet (R26.81);Other abnormalities of gait and mobility (R26.89);Muscle weakness (generalized) (M62.81);Other symptoms and signs involving cognitive function;Hemiplegia and hemiparesis;Pain Hemiplegia - Right/Left:  Left Hemiplegia - dominant/non-dominant: Non-Dominant Hemiplegia - caused by: Cerebral infarction Pain - Right/Left: Left Pain - part of body: Shoulder                Time: 1031-1105 OT Time Calculation (min): 34 min Charges:  OT General Charges $OT Visit: 1 Visit OT Evaluation $OT Eval Moderate Complexity: 1 Mod OT Treatments $Self Care/Home Management : 8-22 mins Ardena Becker, OTR/L Acute Rehabilitation Services  Office [phone] 02/26/2023

## 2023-02-26 NOTE — Plan of Care (Signed)

## 2023-02-26 NOTE — Hospital Course (Signed)
 89 y.o. male with medical history significant of HLD, peripheral neuropathy, arthritis who presented with acute L leg "giving out" and fall on the morning of presentation. Also complains of increased L shoulder pain. Pt subsequently was brought to ED for Code Stroke. CT head was initially found to be unremarkable Neurology consulted, recommended stroke w/u. MRI was ordered by EDP, currently in progress. [facility] hospitalist consulted for consideration for admission. By then time the pt was seen, pt reported improvement in symptoms and was eager to go home. Denies sob or chest pains. Continues to complain of L shoulder ache

## 2023-02-26 NOTE — Care Management Obs Status (Signed)
 MEDICARE OBSERVATION STATUS NOTIFICATION   Patient Details  Name: Steve Thompson MRN: [id number] Date of Birth: 1931/12/07   Medicare Observation Status Notification Given:  Yes    Jannine Meo, RN 02/26/2023, 4:21 PM

## 2023-02-26 NOTE — Progress Notes (Signed)
  Progress Note   Patient: Steve Thompson DISCH ERX:[id number] DOB: 10-08-1931 DOA: 02/25/2023     0 DOS: the patient was seen and examined on 02/26/2023   Brief hospital course: 89 y.o. male with medical history significant of HLD, peripheral neuropathy, arthritis who presented with acute L leg "giving out" and fall on the morning of presentation. Also complains of increased L shoulder pain. Pt subsequently was brought to ED for Code Stroke. CT head was initially found to be unremarkable Neurology consulted, recommended stroke w/u. MRI was ordered by EDP, currently in progress. [facility] hospitalist consulted for consideration for admission. By then time the pt was seen, pt reported improvement in symptoms and was eager to go home. Denies sob or chest pains. Continues to complain of L shoulder ache   Assessment and Plan: LLE, possible TIA -Presented with acute LLE weakness resulting in fall -CT head reviewed, unremarkable -MRI brain confirms acute R thalamocapsular CVA -Neurology consulted, pending recommendations -2d echo pending - CTA Head/neck notable for 45% stenosis in R carotid, 50% stenosis in L carotid -PT/OT/SLP consulted, therapy recs for CIR noted, consulted -Alllowed permissive HTN -Neuro recs for ASA 81mg  with plavix  75mg   x 3 weeks then plavix  alone   Elevated BP -allowing permissive HTN for now -PRN hydralazine  for sbp>220, dbp?110   HLD -Documented history of such -Not on statin PTA on med rec -LDL 126, rosuvastatin  ordered per Neuro   Arthritis -complained of L shoulder pain -L shoulder confirms arthritis - OT following  Hypothyroid -Continue thyroid  replacement -TSH 1.771       Subjective: Eager to go home soon  Physical Exam: Vitals:   02/26/23 1000 02/26/23 1120 02/26/23 1220 02/26/23 1633  BP: (!) 167/92  (!) 152/100   Pulse: 92  100   Resp: 17  17   Temp:  99 F (37.2 C) 98.9 F (37.2 C) 98.8 F (37.1 C)  TempSrc:   Oral Oral  SpO2: 95%  96% 95%   Weight:      Height:       General exam: Awake, laying in bed, in nad Respiratory system: Normal respiratory effort, no wheezing Cardiovascular system: regular rate, s1, s2 Gastrointestinal system: Soft, nondistended, positive BS Central nervous system: CN2-12 grossly intact, strength intact Extremities: Perfused, no clubbing Skin: Normal skin turgor, no notable skin lesions seen Psychiatry: Mood normal // no visual hallucinations   Data Reviewed:  Labs reviewed: LDL 126, a1c 5.7   Family Communication: Pt in room, family not at bedside  Disposition: Status is: Observation The patient remains OBS appropriate and will d/c before 2 midnights.  Planned Discharge Destination: Rehab    Author: Cherylle Corwin, MD 02/26/2023 5:04 PM  For on call review [email or web address].

## 2023-02-27 ENCOUNTER — Other Ambulatory Visit (HOSPITAL_COMMUNITY): Payer: Self-pay

## 2023-02-27 DIAGNOSIS — G459 Transient cerebral ischemic attack, unspecified: Secondary | ICD-10-CM | POA: Diagnosis not present

## 2023-02-27 DIAGNOSIS — G8194 Hemiplegia, unspecified affecting left nondominant side: Secondary | ICD-10-CM | POA: Diagnosis not present

## 2023-02-27 DIAGNOSIS — I639 Cerebral infarction, unspecified: Secondary | ICD-10-CM | POA: Diagnosis not present

## 2023-02-27 LAB — CBC
HCT: 43.5 % (ref 39.0–52.0)
Hemoglobin: 15.1 g/dL (ref 13.0–17.0)
MCH: 30.3 pg (ref 26.0–34.0)
MCHC: 34.7 g/dL (ref 30.0–36.0)
MCV: 87.2 fL (ref 80.0–100.0)
Platelets: 356 10*3/uL (ref 150–400)
RBC: 4.99 MIL/uL (ref 4.22–5.81)
RDW: 12.7 % (ref 11.5–15.5)
WBC: 12.6 10*3/uL — ABNORMAL HIGH (ref 4.0–10.5)
nRBC: 0 % (ref 0.0–0.2)

## 2023-02-27 LAB — COMPREHENSIVE METABOLIC PANEL
ALT: 18 U/L (ref 0–44)
AST: 35 U/L (ref 15–41)
Albumin: 3.6 g/dL (ref 3.5–5.0)
Alkaline Phosphatase: 51 U/L (ref 38–126)
Anion gap: 9 (ref 5–15)
BUN: 8 mg/dL (ref 8–23)
CO2: 26 mmol/L (ref 22–32)
Calcium: 9 mg/dL (ref 8.9–10.3)
Chloride: 102 mmol/L (ref 98–111)
Creatinine, Ser: 0.85 mg/dL (ref 0.61–1.24)
GFR, Estimated: >60 mL/min (ref >60)
Glucose, Bld: 114 mg/dL — ABNORMAL HIGH (ref 70–99)
Potassium: 3.5 mmol/L (ref 3.5–5.1)
Sodium: 137 mmol/L (ref 135–145)
Total Bilirubin: 1.1 mg/dL (ref 0.0–1.2)
Total Protein: 6.1 g/dL — ABNORMAL LOW (ref 6.5–8.1)

## 2023-02-27 MED ORDER — CLOPIDOGREL BISULFATE 75 MG PO TABS
75.0000 mg | ORAL_TABLET | Freq: Every day | ORAL | 2 refills | Status: DC
  Filled 2023-02-27: qty 30, 30d supply, fill #0

## 2023-02-27 MED ORDER — CLOPIDOGREL BISULFATE 75 MG PO TABS
75.0000 mg | ORAL_TABLET | Freq: Every day | ORAL | 0 refills | Status: DC
  Filled 2023-02-27: qty 20, 20d supply, fill #0

## 2023-02-27 MED ORDER — ROSUVASTATIN CALCIUM 20 MG PO TABS
20.0000 mg | ORAL_TABLET | Freq: Every day | ORAL | 2 refills | Status: DC
  Filled 2023-02-27: qty 30, 30d supply, fill #0

## 2023-02-27 MED ORDER — MELATONIN 5 MG PO TABS
5.0000 mg | ORAL_TABLET | Freq: Every evening | ORAL | Status: DC | PRN
  Filled 2023-02-27: qty 1

## 2023-02-27 MED ORDER — LEVOTHYROXINE SODIUM 100 MCG PO TABS
100.0000 ug | ORAL_TABLET | Freq: Every day | ORAL | 2 refills | Status: DC
  Filled 2023-02-27: qty 30, 30d supply, fill #0

## 2023-02-27 MED ORDER — ASPIRIN 81 MG PO TBEC
81.0000 mg | DELAYED_RELEASE_TABLET | Freq: Every day | ORAL | 0 refills | Status: DC
  Filled 2023-02-27: qty 20, 20d supply, fill #0

## 2023-02-27 MED ORDER — TAMSULOSIN HCL 0.4 MG PO CAPS
0.4000 mg | ORAL_CAPSULE | Freq: Every day | ORAL | Status: DC
  Administered 2023-02-27 – 2023-02-28 (×2): 0.4 mg via ORAL
  Filled 2023-02-27 (×2): qty 1

## 2023-02-27 MED ORDER — POTASSIUM CHLORIDE CRYS ER 20 MEQ PO TBCR
40.0000 meq | EXTENDED_RELEASE_TABLET | Freq: Once | ORAL | Status: AC
  Administered 2023-02-27: 40 meq via ORAL
  Filled 2023-02-27: qty 2

## 2023-02-27 NOTE — Plan of Care (Signed)
  Problem: Education: Goal: Knowledge of disease or condition will improve Outcome: Progressing Goal: Knowledge of secondary prevention will improve (MUST DOCUMENT ALL) Outcome: Progressing   Problem: Coping: Goal: Will verbalize positive feelings about self Outcome: Progressing   Problem: Clinical Measurements: Goal: Ability to maintain clinical measurements within normal limits will improve Outcome: Progressing   Problem: Activity: Goal: Risk for activity intolerance will decrease Outcome: Progressing   Problem: Safety: Goal: Ability to remain free from injury will improve Outcome: Progressing

## 2023-02-27 NOTE — Progress Notes (Signed)
STROKE TEAM PROGRESS NOTE   INTERIM HISTORY/SUBJECTIVE Patient sitting comfortably in bedside chair.  His wife is at the bedside.  He has been hemodynamically stable and afebrile overnight and therapy is leaning towards recommending inpatient rehab.  Patient is agreeable.  Very mild left facial and leg weakness persist.  Blood pressure adequately controlled.  Vital signs stable  OBJECTIVE  CBC    Component Value Date/Time   WBC 12.6 (H) 02/27/2023 0440   RBC 4.99 02/27/2023 0440   HGB 15.1 02/27/2023 0440   HGB 15.1 08/23/2022 1032   HCT 43.5 02/27/2023 0440   HCT 44.7 08/23/2022 1032   PLT 356 02/27/2023 0440   PLT 291 08/23/2022 1032   MCV 87.2 02/27/2023 0440   MCV 91 08/23/2022 1032   MCH 30.3 02/27/2023 0440   MCHC 34.7 02/27/2023 0440   RDW 12.7 02/27/2023 0440   RDW 12.9 08/23/2022 1032   LYMPHSABS 1.3 02/25/2023 1420   LYMPHSABS 0.8 08/23/2022 1032   MONOABS 0.8 02/25/2023 1420   EOSABS 0.2 02/25/2023 1420   EOSABS 0.1 08/23/2022 1032   BASOSABS 0.1 02/25/2023 1420   BASOSABS 0.1 08/23/2022 1032    BMET    Component Value Date/Time   NA 137 02/27/2023 0440   NA 140 08/23/2022 1032   K 3.5 02/27/2023 0440   CL 102 02/27/2023 0440   CO2 26 02/27/2023 0440   GLUCOSE 114 (H) 02/27/2023 0440   BUN 8 02/27/2023 0440   BUN 17 08/23/2022 1032   CREATININE 0.85 02/27/2023 0440   CALCIUM 9.0 02/27/2023 0440   CALCIUM 9.5 12/27/2010 0000   EGFR 77 08/23/2022 1032   GFRNONAA >60 02/27/2023 0440    IMAGING past 24 hours No results found.   Vitals:   02/26/23 1633 02/26/23 1913 02/26/23 2326 02/27/23 0329  BP:  (!) 165/116 (!) 148/77 (!) 168/97  Pulse:  90 81 86  Resp:  18 14 20   Temp: 98.8 F (37.1 C) 97.8 F (36.6 C) 97.9 F (36.6 C) 97.6 F (36.4 C)  TempSrc: Oral Oral Oral Oral  SpO2: 95% 92% 92% 93%  Weight:      Height:         PHYSICAL EXAM General:  Alert, well-nourished, well-developed pleasant elderly Caucasian male in no acute  distress Psych:  Mood and affect appropriate for situation CV: Regular rate and rhythm on monitor Respiratory:  Regular, unlabored respirations on room air   NEURO:  Mental Status: AA&Ox3, patient is able to give clear and coherent history Speech/Language: speech is without dysarthria or aphasia.    Cranial Nerves:  II: PERRL. Visual fields full.  III, IV, VI: EOMI. Eyelids elevate symmetrically.  V: Sensation is intact to light touch and symmetrical to face.  VII: Left-sided facial droop VIII: hearing intact to voice. IX, X: Phonation is normal.  XII: tongue is midline without fasciculations. Motor: Able to move all 4 extremities with good antigravity strength, but left hand grip weaker than right, diminished fine finger movements look noted to the left hand, right hand orbits left, and subtle drift noted in the left leg Tone: is normal and bulk is normal Sensation- Intact to light touch bilaterally.  Coordination: FTN intact bilaterally Gait- deferred  Most Recent NIH  1a Level of Conscious.: 0 1b LOC Questions: 0 1c LOC Commands: 0 2 Best Gaze: 0 3 Visual: 0 4 Facial Palsy: 1 5a Motor Arm - left: 0 5b Motor Arm - Right: 0 6a Motor Leg - Left: 1 6b Motor  Leg - Right: 0 7 Limb Ataxia: 0 8 Sensory: 0 9 Best Language: 0 10 Dysarthria: 0 11 Extinct. and Inatten.: 0 TOTAL: 2   ASSESSMENT/PLAN  Steve Thompson is a 89 y.o. male with history of arthritis, myasthenia gravis, hypothyroidism and peripheral neuropathy admitted for acute onset left leg weakness and facial droop.  NIH on Admission 3.  He was found to have a right thalamocapsular infarct on MRI.  He was offered TNK but declined due to risk for hemorrhage.  Acute Ischemic Infarct:  right thalamocapsular infarct Etiology: Small vessel disease Code Stroke CT head No acute abnormality. Small vessel disease. Atrophy. ASPECTS 10.    CTA head & neck no LVO, 45% stenosis in right carotid, 50% stenosis in left  carotid, diffuse tortuosity of major arterial vasculature of the head and neck MRI acute ischemic right thalamocapsular infarct, atrophy and small vessel ischemic disease 2D Echo ejection action 65 to 70%.  Left atrial size normal LDL 126 HgbA1c 5.7 VTE prophylaxis -Lovenox aspirin 81 mg daily prior to admission, now on aspirin 81 mg daily and clopidogrel 75 mg daily for 3 weeks and then Plavix alone. Therapy recommendations:  CIR Disposition: Pending, likely CIR  Hypertension Home meds: None Stable Blood Pressure Goal: BP less than 220/110   Hyperlipidemia Home meds: None LDL 126, goal < 70 Add rosuvastatin 20 mg daily Continue statin at discharge  Other Stroke Risk Factors Advanced age   Other Active Problems Myasthenia gravis-patient currently taking no medications for this at home, will restart if necessary Hypothyroidism-continue home Synthroid  Hospital day # 0  Patient presented with sudden onset of left facial and leg weakness and was offered TNK on admission but refused after discussion about risk-benefit.  MRI confirms moderate right thalamic infarct due to small vessel disease.  Recommend aspirin and Plavix for 3 weeks followed by Plavix alone.  Aggressive risk factor modification.   Continue ongoing therapy and transfer to inpatient rehab when bed available.  Follow-up as an outpatient stroke clinic in 2 months.  Stroke team will sign off.  Kindly call for questions.  Discussed with Dr. Thedore Mins.  Long discussion with patient and answered questions.  Greater than 50% time during this 35-minute visit was spent in counseling and coordination of care and discussion with patient and care team and answering questions.  Delia Heady, MD Medical Director [facility] Pager: [phone] 02/27/2023 2:24 PM   To contact Stroke Continuity provider, please refer to [email or web address]. After hours, contact General Neurology

## 2023-02-27 NOTE — Progress Notes (Signed)
Inpatient Rehab Coordinator Note:  I met with patient and daughter at bedside to discuss CIR recommendations and goals/expectations of CIR stay.  We reviewed 3 hrs/day of therapy, physician follow up, and average length of stay 2 weeks (dependent upon progress) with goals of supervision to mod I.  Pt worried about insurance coverage for CIR and I explained that we would not admit him if insurance didn't give prior auth.  Daughter at bedside encouraging patient to consider CIR as his spouse is frail.  I allowed them time to discuss and RNCM followed up, confirming both pt and daughter agreeable to pursue CIR at this time.  I will start insurance auth request right away.   Estill Dooms, PT, DPT Admissions Coordinator [phone] 02/27/23  11:08 AM

## 2023-02-27 NOTE — Progress Notes (Signed)
Occupational Therapy Treatment Patient Details Name: Steve Thompson MRN: [id number] DOB: 1931/11/15 Today's Date: 02/27/2023   History of present illness Pt is an 89 yo male admitted 2/9 found to have right thalamocapsular Infarct and left shoulder pain but negative for fx. PMH: peripheral neuropathy, hypothyroidism, myasthenia gravis, hld, and bph.   OT comments  Pt currently at min guard to supervision for showering and dressing tasks sit to stand.  One LOB to the left in standing when attempting to reach down to the floor to pick up a bag with new clothing in it.  Still with slight LUE decreased coordination noted but uses the UE functionally at a diminished level.  Pt does not have physical assist at home and will continue to benefit from acute care OT to progress to supervision level goals.  Recommend  intensive inpatient follow-up therapy, >3 hours/day prior to discharge home.         If plan is discharge home, recommend the following:  A little help with walking and/or transfers;A little help with bathing/dressing/bathroom;Assistance with cooking/housework;Assist for transportation;Help with stairs or ramp for entrance;Supervision due to cognitive status;Direct supervision/assist for medications management   Equipment Recommendations  Other (comment) (family will look at purchasing tub bench outside of hospital)       Precautions / Restrictions Precautions Precautions: Fall Restrictions Weight Bearing Restrictions Per Provider Order: No       Mobility Bed Mobility               General bed mobility comments: Pt in recliner at beginning and end of session    Transfers Overall transfer level: Needs assistance Equipment used: None Transfers: Sit to/from Stand Sit to Stand: Supervision     Step pivot transfers: Contact guard assist     General transfer comment: Pt ambulates at a slower rate of speed.     Balance Overall balance assessment: Needs  assistance Sitting-balance support: No upper extremity supported, Feet supported Sitting balance-Leahy Scale: Fair Sitting balance - Comments: Increased posterior lean in sitting while working on dressing.   Standing balance support: No upper extremity supported, During functional activity Standing balance-Leahy Scale: Fair Standing balance comment: Pt able to stand and complete grooming tasks and UB showering/drying off without LOB but unsteadiness noted.                           ADL either performed or assessed with clinical judgement   ADL Overall ADL's : Needs assistance/impaired     Grooming: Wash/dry hands;Wash/dry face;Standing;Minimal assistance;Supervision/safety   Upper Body Bathing: Supervision/ safety;Standing   Lower Body Bathing: Contact guard assist (standing in the shower with use of grab bar)   Upper Body Dressing : Set up;Sitting   Lower Body Dressing: Contact guard assist;Sit to/from stand Lower Body Dressing Details (indicate cue type and reason): LOB to the left when attemtping to donn left gripper sock from the recliner         Tub/ Shower Transfer: Walk-in shower;Contact guard assist;Ambulation   Functional mobility during ADLs: Contact guard assist (No assistive device ambulating in the room) General ADL Comments: Pt able to sequence through bathing and dressing tasks with min questioning cueing.  One LOB to the left when reaching down beside of the bed to pull clothes out of his bag.  Slight decreased LUE FM coordination noted when attempting to donn his gripper sock.  He had to regrip X3 as the end slipped out of his fingers.  Discussed benefit of shower seat/bench for safety at home and daughter is in agreement and will likely purchase from outside source.     Communication Communication Communication: No apparent difficulties   Cognition Arousal: Alert Behavior During Therapy: WFL for tasks assessed/performed Cognition: Cognition  impaired   Orientation impairments: Person, Place, Time, Situation Awareness: Online awareness impaired Memory impairment (select all impairments): Short-term memory Attention impairment (select first level of impairment): Alternating attention                                            Pertinent Vitals/ Pain       Pain Assessment Pain Assessment: No/denies pain         Frequency  Min 1X/week        Progress Toward Goals  OT Goals(current goals can now be found in the care plan section)  Progress towards OT goals: Progressing toward goals  Acute Rehab OT Goals Patient Stated Goal: Pt is hopeful to go to rehab for a few days OT Goal Formulation: With patient Time For Goal Achievement: 03/12/23 Potential to Achieve Goals: Good  Plan         AM-PAC OT "6 Clicks" Daily Activity     Outcome Measure   Help from another person eating meals?: None Help from another person taking care of personal grooming?: A Little Help from another person toileting, which includes using toliet, bedpan, or urinal?: A Little Help from another person bathing (including washing, rinsing, drying)?: A Little Help from another person to put on and taking off regular upper body clothing?: A Little Help from another person to put on and taking off regular lower body clothing?: A Little 6 Click Score: 19    End of Session    OT Visit Diagnosis: Unsteadiness on feet (R26.81);Other abnormalities of gait and mobility (R26.89);Muscle weakness (generalized) (M62.81);Other symptoms and signs involving cognitive function;Hemiplegia and hemiparesis;Pain Hemiplegia - Right/Left: Left Hemiplegia - caused by: Cerebral infarction Pain - Right/Left: Left   Activity Tolerance Patient tolerated treatment well   Patient Left in chair;with chair alarm set;with call bell/phone within reach;with family/visitor present   Nurse Communication Mobility status        Time: 1423-1520 OT Time  Calculation (min): 57 min  Charges: OT General Charges $OT Visit: 1 Visit OT Treatments $Self Care/Home Management : 53-67 mins   Perrin Maltese, OTR/L Acute Rehabilitation Services  Office [phone] 02/27/2023

## 2023-02-27 NOTE — Discharge Summary (Addendum)
 Steve Thompson WGN:[id number] DOB: 01-02-1932 DOA: 02/25/2023  PCP: [name], MD  Admit date: 02/25/2023  Discharge date: 02/27/2023  Admitted From: Home   Disposition:  Home he initially refused CIR, later told that patient has now agreed going to CIR.  Will go to CIR if he agrees.   Recommendations for Outpatient Follow-up:   Follow up with PCP in 1-2 weeks  PCP Please obtain BMP/CBC, 2 view CXR in 1week,  (see Discharge instructions)   PCP Please follow up on the following pending results: Monitor blood pressure, needs outpatient follow-up with neurology and vascular surgery, check his home medications he is not taking several home medications for months.   Home Health: PT, OT, Aide if qualifies   Equipment/Devices:  as below  Consultations: Neuro Discharge Condition: Stable    CODE STATUS: Full    Diet Recommendation: Heart Healthy     Chief Complaint  Patient presents with   Code Stroke     Brief history of present illness from the day of admission and additional interim summary    89 y.o. male with medical history significant of HLD, peripheral neuropathy, arthritis who presented with acute L leg "giving out" and fall on the morning of presentation. Also complains of increased L shoulder pain. Pt subsequently was brought to ED for Code Stroke. CT head was initially found to be unremarkable Neurology consulted, recommended stroke w/u. MRI was ordered by EDP, currently in progress. [facility] hospitalist consulted for consideration for admission. By then time the pt was seen, pt reported improvement in symptoms and was eager to go home. Denies sob or chest pains. Continues to complain of L shoulder ache                                                                  Hospital Course   Left arm tingling  numbness and weakness due to stroke.  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 Pearlean Brownie on 02/27/2023, DAPT for 3 weeks thereafter Plavix, LDL above goal hence started on statin which she will continue.  Stable echo, CTA showed some nonacute changes for which he needs close outpatient follow-up with vascular surgery, PCP and neurology.  Currently he is back to his baseline, he did qualify for CIR however at this time he is back to his baseline neurological deficits have resolved and he wants to go home and vehemently refuses any placement.  Home PT OT if he qualifies will be ordered.  Was informed by the staff that patient has now agreed to go to CIR, we will look for CIR bed, continue discharge to CIR once bed available.   Dyslipidemia.  Placed on statin.    Elevated BP -Likely compensatory poststroke currently under permissive  hypertension, follow-up with PCP in a week, PCP to monitor thereafter.   Chronic left shoulder arthritis -complained of L shoulder pain -L shoulder confirms arthritis -Home PT OT, he is back to his baseline and eager to go home does not want to go to a facility.   BPH.  Flomax.    Hypothyroid -Continue thyroid replacement -TSH 1.771  Possible underlying history of myasthenia gravis.  Supposed to be on Mestinon and prednisone but has not taken it in months confirmed by multiple family members and his pharmacy, requested daughter to call PCP and confirm if he is supposed to be taking these medications and resume as instructed.   Discharge diagnosis     Principal Problem:   TIA (transient ischemic attack)    Discharge instructions    Discharge Instructions     Diet - low sodium heart healthy   Complete by: As directed    Discharge instructions   Complete by: As directed    Follow with Primary MD [name], MD in 7 days   Get CBC, CMP, 2 view Chest X ray -   checked next visit with your primary MD   Activity: As tolerated with Full fall precautions use walker/cane & assistance as needed  Disposition Home    Diet: Heart Healthy    Special Instructions: If you have smoked or chewed Tobacco  in the last 2 yrs please stop smoking, stop any regular Alcohol  and or any Recreational drug use.  On your next visit with your primary care physician please Get Medicines reviewed and adjusted.  Please request your Prim.MD to go over all Hospital Tests and Procedure/Radiological results at the follow up, please get all Hospital records sent to your Prim MD by signing hospital release before you go home.  If you experience worsening of your admission symptoms, develop shortness of breath, life threatening emergency, suicidal or homicidal thoughts you must seek medical attention immediately by calling 911 or calling your MD immediately  if symptoms less severe.  You Must read complete instructions/literature along with all the possible adverse reactions/side effects for all the Medicines you take and that have been prescribed to you. Take any new Medicines after you have completely understood and accpet all the possible adverse reactions/side effects.   Do not drive when taking Pain medications.  Do not take more than prescribed Pain, Sleep and Anxiety Medications   Increase activity slowly   Complete by: As directed        Discharge Medications   Allergies as of 02/27/2023   No Known Allergies      Medication List     TAKE these medications    aspirin 81 MG tablet Take 1 tablet (81 mg total) by mouth daily. Patient also taking a medication for cholesterol, but does not remember name or dosage. Will provide at next visit.   clopidogrel 75 MG tablet Commonly known as: PLAVIX Take 1 tablet (75 mg total) by mouth daily. Start taking on: February 28, 2023   gabapentin 300 MG capsule Commonly known as: NEURONTIN Take 300 mg by mouth 4 (four)  times daily.  Takes 1 capsule in am  3 capsules at night ( around 7:30p)   loratadine 10 MG tablet Commonly known as: CLARITIN 1 tablet Orally Once a day   predniSONE 5 MG tablet Commonly known as: DELTASONE Take 5 mg by mouth daily with breakfast.   pyridostigmine 60 MG tablet Commonly known as: MESTINON Take 60 mg by mouth 3 (  three) times daily. Patient not taking   rosuvastatin 20 MG tablet Commonly known as: CRESTOR Take 1 tablet (20 mg total) by mouth daily. Start taking on: February 28, 2023   Synthroid 88 MCG tablet Generic drug: levothyroxine Take 1 tablet (88 mcg total) by mouth daily. What changed: how much to take   tamsulosin 0.4 MG Caps capsule Commonly known as: FLOMAX Take 0.4 mg by mouth daily.         Follow-up Information     [name], MD. Schedule an appointment as soon as possible for a visit in 1 week(s).   Specialty: Family Medicine Contact information: [address] [place] [address] [phone]         [facility]. Schedule an appointment as soon as possible for a visit in 1 month(s).   Contact information: [address]     [address] [place] [address] [phone]        Victorino Sparrow, MD. Schedule an appointment as soon as possible for a visit in 1 month(s).   Specialty: Vascular Surgery Contact information: [address] [place] [address] [phone]                 Major procedures and Radiology Reports - PLEASE review detailed and final reports thoroughly  -       ECHOCARDIOGRAM COMPLETE Result Date: 02/26/2023    ECHOCARDIOGRAM REPORT   Patient Name:   MAXEMILIANO RIEL Date of Exam: 02/25/2023 Medical Rec #:  [id number]      Height:       69.0 in Accession #:    [id number]     Weight:       194.4 lb Date of Birth:  04/05/1931      BSA:          2.041 m Patient Age:    89 years       BP:           184/86 mmHg Patient Gender: M              HR:            75 bpm. Exam Location:  Inpatient Procedure: 2D Echo, Cardiac Doppler and Color Doppler Indications:    stroke  History:        Patient has prior history of Echocardiogram examinations, most                 recent 05/05/2015.  Sonographer:    Delcie Roch RDCS Referring Phys: [phone] STEPHEN K CHIU IMPRESSIONS  1. Left ventricular ejection fraction, by estimation, is 65 to 70%. The left ventricle has normal function. The left ventricle has no regional wall motion abnormalities. There is mild asymmetric left ventricular hypertrophy of the basal-septal segment. Indeterminate diastolic filling due to E-A fusion.  2. Right ventricular systolic function is normal. The right ventricular size is normal. There is normal pulmonary artery systolic pressure.  3. The mitral valve is grossly normal. Trivial mitral valve regurgitation. No evidence of mitral stenosis.  4. The aortic valve is grossly normal. There is mild calcification of the aortic valve. Aortic valve regurgitation is trivial. Aortic valve sclerosis/calcification is present, without any evidence of aortic stenosis.  5. The inferior vena cava is normal in size with greater than 50% respiratory variability, suggesting right atrial pressure of 3 mmHg. FINDINGS  Left Ventricle: Left ventricular ejection fraction, by estimation, is 65 to 70%. The left ventricle has normal function. The  left ventricle has no regional wall motion abnormalities. The left ventricular internal cavity size was normal in size. There is  mild asymmetric left ventricular hypertrophy of the basal-septal segment. Indeterminate diastolic filling due to E-A fusion. Right Ventricle: The right ventricular size is normal. No increase in right ventricular wall thickness. Right ventricular systolic function is normal. There is normal pulmonary artery systolic pressure. The tricuspid regurgitant velocity is 2.74 m/s, and  with an assumed right atrial pressure of 3 mmHg, the estimated right ventricular  systolic pressure is 33.0 mmHg. Left Atrium: Left atrial size was normal in size. Right Atrium: Right atrial size was normal in size. Pericardium: There is no evidence of pericardial effusion. Mitral Valve: The mitral valve is grossly normal. Trivial mitral valve regurgitation. No evidence of mitral valve stenosis. Tricuspid Valve: The tricuspid valve is normal in structure. Tricuspid valve regurgitation is mild . No evidence of tricuspid stenosis. Aortic Valve: The aortic valve is grossly normal. There is mild calcification of the aortic valve. Aortic valve regurgitation is trivial. Aortic valve sclerosis/calcification is present, without any evidence of aortic stenosis. Pulmonic Valve: The pulmonic valve was normal in structure. Pulmonic valve regurgitation is not visualized. No evidence of pulmonic stenosis. Aorta: The aortic root is normal in size and structure. Venous: The inferior vena cava is normal in size with greater than 50% respiratory variability, suggesting right atrial pressure of 3 mmHg. IAS/Shunts: The atrial septum is grossly normal.  LEFT VENTRICLE PLAX 2D LVIDd:         3.90 cm   Diastology LVIDs:         2.70 cm   LV e' medial:    5.77 cm/s LV PW:         0.90 cm   LV E/e' medial:  9.6 LV IVS:        1.30 cm   LV e' lateral:   7.40 cm/s LVOT diam:     2.20 cm   LV E/e' lateral: 7.5 LV SV:         89 LV SV Index:   43 LVOT Area:     3.80 cm  RIGHT VENTRICLE             IVC RV Basal diam:  2.90 cm     IVC diam: 1.50 cm RV S prime:     11.50 cm/s TAPSE (M-mode): 2.7 cm LEFT ATRIUM             Index        RIGHT ATRIUM           Index LA diam:        3.70 cm 1.81 cm/m   RA Area:     20.50 cm LA Vol (A2C):   41.5 ml 20.33 ml/m  RA Volume:   58.20 ml  28.51 ml/m LA Vol (A4C):   45.2 ml 22.14 ml/m LA Biplane Vol: 45.2 ml 22.14 ml/m  AORTIC VALVE LVOT Vmax:   109.00 cm/s LVOT Vmean:  73.200 cm/s LVOT VTI:    0.233 m  AORTA Ao Root diam: 3.30 cm MV E velocity: 55.50 cm/s   TRICUSPID VALVE MV A  velocity: 103.00 cm/s  TR Peak grad:   30.0 mmHg MV E/A ratio:  0.54         TR Vmax:        274.00 cm/s  SHUNTS                             Systemic VTI:  0.23 m                             Systemic Diam: 2.20 cm Weston Brass MD Electronically signed by Weston Brass MD Signature Date/Time: 02/26/2023/9:44:19 PM    Final    CT ANGIO HEAD NECK W WO CM Result Date: 02/25/2023 CLINICAL DATA:  Initial evaluation for acute stroke. EXAM: CT ANGIOGRAPHY HEAD AND NECK WITH AND WITHOUT CONTRAST TECHNIQUE: Multidetector CT imaging of the head and neck was performed using the standard protocol during bolus administration of intravenous contrast. Multiplanar CT image reconstructions and MIPs were obtained to evaluate the vascular anatomy. Carotid stenosis measurements (when applicable) are obtained utilizing NASCET criteria, using the distal internal carotid diameter as the denominator. RADIATION DOSE REDUCTION: This exam was performed according to the departmental dose-optimization program which includes automated exposure control, adjustment of the mA and/or kV according to patient size and/or use of iterative reconstruction technique. CONTRAST:  75mL OMNIPAQUE IOHEXOL 350 MG/ML SOLN COMPARISON:  CT and MRI from earlier the same day. FINDINGS: CTA NECK FINDINGS Aortic arch: Visualized aortic arch within normal limits for caliber with standard 3 vessel morphology. Moderate to advanced atheromatous change about the arch and origin of the great vessels without hemodynamically significant stenosis. Right carotid system: Right common and internal carotid arteries are tortuous but patent without dissection. Moderate calcified plaque about the right carotid bulb with up to 45% stenosis by NASCET criteria. Left carotid system: Left common and internal carotid arteries are tortuous but patent without dissection. Bulky calcified plaque about the left carotid bulb with associated stenosis of up to 50%  by NASCET criteria. Vertebral arteries: Both vertebral arteries arise from subclavian arteries. No significant proximal subclavian artery stenosis. Soft plaque at the origin of the right vertebral artery with moderate stenosis. Vertebral arteries are tortuous but patent distally without stenosis or dissection. Skeleton: No worrisome osseous lesions. Moderate cervical spondylosis, most pronounced at C6-7. Other neck: No other acute finding. Upper chest: No other acute finding. Review of the MIP images confirms the above findings CTA HEAD FINDINGS Anterior circulation: Moderate atheromatous change about the carotid siphons with up to mild stenoses bilaterally. A1 segments, anterior communicating artery complex, and anterior cerebral arteries widely patent. No M1 stenosis or occlusion. Distal MCA branches perfused and symmetric. Posterior circulation: Both V4 segments widely patent. Both PICA patent. Basilar patent without stenosis. Superior cerebellar and posterior cerebral arteries patent bilaterally. Venous sinuses: Patent allowing for timing the contrast bolus. Anatomic variants: None significant.  No aneurysm. Review of the MIP images confirms the above findings IMPRESSION: 1. Negative CTA for large vessel occlusion or other emergent finding. 2. Moderate atheromatous change about the carotid bifurcations with associated stenoses of up to 45% on the right and 50% on the left. 3. Moderate atheromatous change about the carotid siphons with up to mild stenoses bilaterally. 4. Diffuse tortuosity of the major arterial vasculature of the head and neck, suggesting chronic underlying hypertension. 5.  Aortic Atherosclerosis (ICD10-I70.0). Electronically Signed   By: Rise Mu M.D.   On: 02/25/2023 20:29   MR BRAIN WO CONTRAST Result Date: 02/25/2023 CLINICAL DATA:  Initial evaluation for acute neuro deficit, stroke suspected. EXAM: MRI HEAD WITHOUT CONTRAST TECHNIQUE: Multiplanar, multiecho pulse sequences of  the brain  and surrounding structures were obtained without intravenous contrast. COMPARISON:  Prior CTs from earlier the same day. FINDINGS: Brain: Generalized age-related cerebral atrophy. Patchy T2/FLAIR hyperintensity involving the periventricular deep white matter both cerebral hemispheres, consistent with chronic small vessel ischemic disease. Overall, changes are mild for age. 1.3 cm focus of restricted diffusion seen involving the right thalamocapsular region, consistent with an acute ischemic infarct. No associated hemorrhage or mass effect. No other evidence for acute or subacute ischemia. Gray-white matter differentiation otherwise maintained. No acute or chronic intracranial blood products. No mass lesion, midline shift or mass effect. No hydrocephalus or extra-axial fluid collection. Pituitary gland suprasellar region within normal limits. Vascular: Major intracranial vascular flow voids are maintained. Skull and upper cervical spine: Craniocervical junction within normal limits. Bone marrow signal intensity normal. No scalp soft tissue abnormality. Sinuses/Orbits: Prior ocular lens replacement on the right. Scattered mucosal thickening present throughout the paranasal sinuses. No mastoid effusion. Other: None. IMPRESSION: 1. 1.3 cm acute ischemic nonhemorrhagic right thalamocapsular infarct. 2. Underlying age-related cerebral atrophy with mild chronic small vessel ischemic disease. Electronically Signed   By: Rise Mu M.D.   On: 02/25/2023 19:13   DG Shoulder Left Result Date: 02/25/2023 CLINICAL DATA:  Left shoulder pain. EXAM: LEFT SHOULDER - 2+ VIEW COMPARISON:  None Available. FINDINGS: There is no acute fracture or dislocation. The bones are osteopenic. There is arthritic changes of the left shoulder with spurring of the left AC joint. The soft tissues are unremarkable. Atherosclerotic calcification of the aorta. IMPRESSION: 1. No acute fracture or dislocation. 2. Arthritic changes of  the left shoulder. Electronically Signed   By: Elgie Collard M.D.   On: 02/25/2023 17:18   CT HEAD CODE STROKE WO CONTRAST Result Date: 02/25/2023 CLINICAL DATA:  Code stroke. Neuro deficit, acute, stroke suspected. New onset of left-sided weakness. EXAM: CT HEAD WITHOUT CONTRAST TECHNIQUE: Contiguous axial images were obtained from the base of the skull through the vertex without intravenous contrast. RADIATION DOSE REDUCTION: This exam was performed according to the departmental dose-optimization program which includes automated exposure control, adjustment of the mA and/or kV according to patient size and/or use of iterative reconstruction technique. COMPARISON:  MR head 01/12/2022. FINDINGS: Brain: Mild atrophy and progressive white matter disease is noted bilaterally. No acute infarct, hemorrhage, or mass lesion is present. Basal ganglia are intact. Insular ribbon is normal. The ventricles are of normal size. No significant extraaxial fluid collection is present. The brainstem and cerebellum are within normal limits. Vascular: Atherosclerotic calcifications are present within the cavernous internal carotid arteries bilaterally. No hyperdense vessel is present. Skull: Calvarium is intact. No focal lytic or blastic lesions are present. No significant extracranial soft tissue lesion is present. Sinuses/Orbits: The paranasal sinuses and mastoid air cells are clear. Right lens replacement is present. Globes and orbits are otherwise within normal limits. ASPECTS [facility] Stroke Program Early CT Score) - Ganglionic level infarction (caudate, lentiform nuclei, internal capsule, insula, M1-M3 cortex): 7/7 - Supraganglionic infarction (M4-M6 cortex): 3/3 Total score (0-10 with 10 being normal): 10/10 IMPRESSION: 1. No acute intracranial abnormality or significant interval change. 2. Mild atrophy and progressive white matter disease. This likely reflects the sequela of chronic microvascular ischemia. 3. Aspects is  10/10 The above was relayed via text pager to Dr. Caryl Pina on 02/25/2023 at 14:33 . Electronically Signed   By: Marin Roberts M.D.   On: 02/25/2023 14:34    Micro Results     No results found for this or any previous visit (  from the past 240 hours).  Today   Subjective    Jawara Latorre today has no headache,no chest abdominal pain,no new weakness tingling or numbness, feels much better wants to go home today.     Objective   Blood pressure (!) 168/97, pulse 86, temperature 97.6 F (36.4 C), temperature source Oral, resp. rate 20, height 5\' 11"  (1.803 m), weight 83.9 kg, SpO2 93%.   Intake/Output Summary (Last 24 hours) at 02/27/2023 1034 Last data filed at 02/27/2023 6045 Gross per 24 hour  Intake --  Output 100 ml  Net -100 ml    Exam  Awake Alert, No new F.N deficits,    [place].AT,PERRAL Supple Neck,   Symmetrical Chest wall movement, Good air movement bilaterally, CTAB RRR,No Gallops,   +ve B.Sounds, Abd Soft, Non tender,  No Cyanosis, Clubbing or edema    Data Review   Recent Labs  Lab 02/25/23 1420 02/25/23 1426 02/27/23 0440  WBC 9.2  --  12.6*  HGB 15.1 15.0 15.1  HCT 44.2 44.0 43.5  PLT 314  --  356  MCV 88.6  --  87.2  MCH 30.3  --  30.3  MCHC 34.2  --  34.7  RDW 12.7  --  12.7  LYMPHSABS 1.3  --   --   MONOABS 0.8  --   --   EOSABS 0.2  --   --   BASOSABS 0.1  --   --     Recent Labs  Lab 02/25/23 1420 02/25/23 1426 02/27/23 0440  NA 137 137 137  K 3.9 3.9 3.5  CL 101 102 102  CO2 25  --  26  ANIONGAP 11  --  9  GLUCOSE 114* 109* 114*  BUN 13 15 8   CREATININE 0.87 0.80 0.85  AST 25  --  35  ALT 17  --  18  ALKPHOS 50  --  51  BILITOT 0.7  --  1.1  ALBUMIN 3.8  --  3.6  INR 1.0  --   --   TSH 1.771  --   --   HGBA1C 5.7*  --   --   CALCIUM 9.6  --  9.0   Lab Results  Component Value Date   CHOL 187 02/26/2023   HDL 43 02/26/2023   LDLCALC 126 (H) 02/26/2023   TRIG 91 02/26/2023   CHOLHDL 4.3 02/26/2023    Total Time  in preparing paper work, data evaluation and todays exam - 35 minutes  Signature  -    Susa Raring M.D on 02/27/2023 at 10:34 AM   -  To page go to [email or web address]

## 2023-02-27 NOTE — TOC Progression Note (Signed)
Transition of Care [facility]) - Progression Note    Patient Details  Name: DOMINIC MAHANEY MRN: [id number] Date of Birth: 02-12-1931  Transition of Care [facility]) CM/SW Contact  Gordy Clement, RN Phone Number: 02/27/2023, 11:09 AM  Clinical Narrative:   Patient has decided that he is interested in CIR as per recommendation. Initially he was declining but has reconsidered. RNCM has notified Estill Dooms at [facility] and she will be submitting for auth. Caitilin anticipates auth to come back quickly and we could possibly have patient dc tomorrow Today. RNCM has  also given daughter and patient information on home health agencies in the event insurance Berkley Harvey is not approved.   TOC will continue to follow patient for any additional discharge needs           Expected Discharge Plan and Services         Expected Discharge Date: 02/27/23                                     Social Determinants of Health (SDOH) Interventions SDOH Screenings   Food Insecurity: No Food Insecurity (02/26/2023)  Housing: Low Risk  (02/26/2023)  Transportation Needs: No Transportation Needs (02/26/2023)  Utilities: Not At Risk (02/26/2023)  Social Connections: Moderately Integrated (02/26/2023)  Tobacco Use: Medium Risk (02/25/2023)    Readmission Risk Interventions     No data to display

## 2023-02-27 NOTE — PMR Pre-admission (Shared)
PMR Admission Coordinator Pre-Admission Assessment  Patient: Steve Thompson is an 89 y.o., male MRN: [id number] DOB: April 12, 1931 Height: 5\' 11"  (180.3 cm) Weight: 83.9 kg              Insurance Information HMO:     PPO: yes     PCP:      IPA:      80/20:      OTHER:  PRIMARY: [employer] PPO      Policy#: [id number]      Subscriber: pt CM Name: ***      Phone#: ***     Fax#: epic access Pre-Cert#: ***      Employer:  Benefits:  Phone #: [phone]     Name:  Eff. Date: 01/17/23     Deduct: $0      Out of Pocket Max: $3400 (met $60)      Life Max: n/a  CIR: $325/day for days 1-6      SNF: 20 full days Outpatient:      Co-Pay: $15/visit Home Health: 100%      Co-Pay:  DME: 80%     Co-Pay: 20% Providers:  SECONDARY:       Policy#:       Phone#:   Artist:       Phone#:   The Engineer, materials Information Summary" for patients in Inpatient Rehabilitation Facilities with attached "Privacy Act Statement-Health Care Records" was provided and verbally reviewed with: Patient and Family  Emergency Contact Information Contact Information     Name Relation Home Work Mobile   Ashley [place] [phone]     Larina Bras Daughter   [phone]      Other Contacts     Name Relation Home Work Mobile   [place] Granddaughter   [phone]      Current Medical History  Patient Admitting Diagnosis: CVA   History of Present Illness: Pt is an 89 y/o male with a PMH of peripheral neuropathy, arthritis, myasthenia gravis, who presented to [facility] on 02/25/23 after a fall.  NIHSS 3.  Initial CT head was negative, but MRI revealed an acute right nonhemorrhagic thalamocapsular ischemic infarct.  Neurolog consulted and recommended plavix and aspirin x3 weeks, then plavix alone.  Hospital course L shoulder pain, imaging negative, and pain control.  Hg A1C 6.1.  Pt was offered TNK but declined due to risk of ICH.  Therapy evaluations completed and pt was  recommended for CIR.   Complete NIHSS TOTAL: 1 Glasgow Coma Scale Score: 15  Patient's medical record from [facility] has been reviewed by the rehabilitation admission coordinator and physician.  Past Medical History  Past Medical History:  Diagnosis Date   Arthritis    Hemorrhoids    Hypercalcemia    Hyperthyroidism    Peripheral neuropathy     Has the patient had major surgery during 100 days prior to admission? No  Family History  family history includes Cancer in his sister.   Current Medications   Current Facility-Administered Medications:     stroke: early stages of recovery book, , Does not apply, Once, Jerald Kief, MD   acetaminophen (TYLENOL) tablet 650 mg, 650 mg, Oral, Q4H PRN, 650 mg at 02/27/23 0640 **OR** acetaminophen (TYLENOL) 160 MG/5ML solution 650 mg, 650 mg, Per Tube, Q4H PRN **OR** acetaminophen (TYLENOL) suppository 650 mg, 650 mg, Rectal, Q4H PRN, Jerald Kief, MD   aspirin EC tablet 81 mg, 81 mg, Oral, Daily, Jerald Kief, MD, 81 mg  at 02/27/23 0956   clopidogrel (PLAVIX) tablet 75 mg, 75 mg, Oral, Daily, Milon Dikes, MD, 75 mg at 02/27/23 0956   enoxaparin (LOVENOX) injection 40 mg, 40 mg, Subcutaneous, Q24H, Jerald Kief, MD, 40 mg at 02/26/23 2105   hydrALAZINE (APRESOLINE) injection 10 mg, 10 mg, Intravenous, Q4H PRN, Jerald Kief, MD   levothyroxine (SYNTHROID) tablet 100 mcg, 100 mcg, Oral, Daily, Jerald Kief, MD, 100 mcg at 02/27/23 0442   pneumococcal 20-valent conjugate vaccine (PREVNAR 20) injection 0.5 mL, 0.5 mL, Intramuscular, Tomorrow-1000, Jerald Kief, MD   rosuvastatin (CRESTOR) tablet 20 mg, 20 mg, Oral, Daily, de [place], [place] E, NP, 20 mg at 02/27/23 1610   senna-docusate (Senokot-S) tablet 1 tablet, 1 tablet, Oral, QHS PRN, Jerald Kief, MD   tamsulosin [facility]) capsule 0.4 mg, 0.4 mg, Oral, Daily, Leroy Sea, MD, 0.4 mg at 02/27/23 9604  Patients Current Diet:  Diet Order             Diet -  low sodium heart healthy           Diet Heart Room service appropriate? Yes; Fluid consistency: Thin  Diet effective now                   Precautions / Restrictions Precautions Precautions: Fall Restrictions Weight Bearing Restrictions Per Provider Order: No   Has the patient had 2 or more falls or a fall with injury in the past year?Yes  Prior Activity Level Community (5-7x/wk): indep prior to admit without DME, wife drives  Prior Functional Level Prior Function Prior Level of Function : Independent/Modified Independent (wife drives) Mobility Comments: was not using device prior to admit ADLs Comments: B/D self  Self Care: Did the patient need help bathing, dressing, using the toilet or eating?  Independent  Indoor Mobility: Did the patient need assistance with walking from room to room (with or without device)? Independent  Stairs: Did the patient need assistance with internal or external stairs (with or without device)? Independent  Functional Cognition: Did the patient need help planning regular tasks such as shopping or remembering to take medications? Independent  Patient Information Are you of Hispanic, Latino/a,or Spanish origin?: A. No, not of Hispanic, Latino/a, or Spanish origin What is your race?: A. White Do you need or want an interpreter to communicate with a doctor or health care staff?: 0. No  Patient's Response To:  Health Literacy and Transportation Is the patient able to respond to health literacy and transportation needs?: Yes Health Literacy - How often do you need to have someone help you when you read instructions, pamphlets, or other written material from your doctor or pharmacy?: Never In the past 12 months, has lack of transportation kept you from medical appointments or from getting medications?: No In the past 12 months, has lack of transportation kept you from meetings, work, or from getting things needed for daily living?: No  Home  Assistive Devices / Equipment Home Equipment: Agricultural consultant (2 wheels), [place] - single point, [employer] - quad, Rollator (4 wheels), BSC/3in1, Shower seat, Grab bars - toilet, Grab bars - tub/shower, Hand held shower head  Prior Device Use: Indicate devices/aids used by the patient prior to current illness, exacerbation or injury? None of the above  Current Functional Level Cognition  Arousal/Alertness: Awake/alert Overall Cognitive Status: No family/caregiver present to determine baseline cognitive functioning Orientation Level: Oriented X4 Following Commands: Follows one step commands consistently, Follows multi-step commands with increased time  Safety/Judgement: Decreased awareness of safety, Decreased awareness of deficits General Comments: Pt with decreased awareness of balance deficits when asked.  Only able to recall 1/3 words after approximately 2 min delay.  No words after 15 min delay, using the same words. Attention: Focused, Sustained Focused Attention: Appears intact Sustained Attention: Impaired Sustained Attention Impairment: Verbal basic Memory: Impaired Memory Impairment: Decreased short term memory Decreased Short Term Memory: Verbal basic Awareness: Impaired Executive Function: Organizing Organizing: Impaired    Extremity Assessment (includes Sensation/Coordination)  Upper Extremity Assessment: LUE deficits/detail LUE Deficits / Details: Shoulder flexion AROM 0-100 degrees, AAROM 0-130 degrees with some pain at glenohumeral joint.  Elbow 4/5, grip 4/5, LUE Sensation: WNL LUE Coordination:  (slight decreased speed with finger to nose compared to the right)  Lower Extremity Assessment: Defer to PT evaluation LLE Deficits / Details: hip 3-/5, ankle DF 3-/5, ankle PF 3/5, knee 3+/5 LLE Sensation: decreased proprioception LLE Coordination: decreased gross motor    ADLs  Overall ADL's : Needs assistance/impaired Eating/Feeding: Independent, Sitting Grooming: Wash/dry  hands, Wash/dry face, Standing, Minimal assistance Upper Body Bathing: Supervision/ safety, Sitting Lower Body Bathing: Minimal assistance, Sit to/from stand Upper Body Dressing : Supervision/safety, Sitting Lower Body Dressing: Minimal assistance, Sit to/from stand Toilet Transfer: Minimal assistance, Ambulation, Regular Toilet Toileting- Clothing Manipulation and Hygiene: Minimal assistance, Sit to/from stand Functional mobility during ADLs: Minimal assistance (ambulation without assistive device) General ADL Comments: Pt with increased LOB to the left when turning to sit on the toilet without assistive device.  Pt with decreased awareness of balance deficits and increased fall risk.  Pt's granddaughter in at end of session and reports that pt's wife is recoveing from PNA currently and family will be helping provide initial supervision post discharge.    Mobility  Overal bed mobility: Needs Assistance Bed Mobility: Sit to Supine, Supine to Sit Supine to sit: Contact guard Sit to supine: Contact guard assist General bed mobility comments: Pt in recliner at beginning and end of session    Transfers  Overall transfer level: Needs assistance Equipment used: None, Rolling walker (2 wheels) Transfers: Sit to/from Stand Sit to Stand: Contact guard assist Bed to/from chair/wheelchair/BSC transfer type:: Step pivot Step pivot transfers: Min assist General transfer comment: From recliner with cues for hand placement and safety due to pt attempting to stand despite chair beginning to roll backwards    Ambulation / Gait / Stairs / Wheelchair Mobility  Ambulation/Gait Ambulation/Gait assistance: Contact guard assist Gait Distance (Feet): 200 Feet (+30) Assistive device: Rolling walker (2 wheels), None Gait Pattern/deviations: Step-through pattern, Decreased stride length, Drifts right/left, Trunk flexed General Gait Details: Pt completes most of gait trial with RW and final 3ft without AD, then  additional short trial in room without AD. Pt requires cues for RW management and upright posture. Without AD, pt demonstrates increased instability and drifting, but no LOB. Pt demonstrates inconsistent step lengths and low foot clearance requiring CGA for safety throughout. Gait velocity interpretation: 1.31 - 2.62 ft/sec, indicative of limited community ambulator    Posture / Balance Dynamic Sitting Balance Sitting balance - Comments: Increased posterior lean in sitting while working on dressing. Balance Overall balance assessment: Needs assistance Sitting-balance support: No upper extremity supported, Feet supported Sitting balance-Leahy Scale: Fair Sitting balance - Comments: Increased posterior lean in sitting while working on dressing. Standing balance support: No upper extremity supported, During functional activity, Bilateral upper extremity supported Standing balance-Leahy Scale: Fair Standing balance comment: with and without AD. Pt able to perform  static standing marches without UE support with intermittent min A for balance. Increased difficulty with LLE SLS during high marches and some staggaring to the R noted    Special needs/care consideration Diabetic management yes     Previous Home Environment (from acute therapy documentation) Living Arrangements: Spouse/significant other Available Help at Discharge: Family, Available 24 hours/day Type of Home: Other(Comment) (town home) Home Layout: One level Home Access: Stairs to enter Entrance Stairs-Rails: Right Secretary/administrator of Steps: 2 Bathroom Shower/Tub: Engineer, manufacturing systems: Standard Home Care Services: No  Discharge Living Setting Plans for Discharge Living Setting: Patient's home, Lives with (comment) (spouse) Type of Home at Discharge: House Discharge Home Layout: One level Discharge Home Access: Stairs to enter Entrance Stairs-Rails: Right Entrance Stairs-Number of Steps: 2 Discharge Bathroom  Shower/Tub: Tub/shower unit Discharge Bathroom Toilet: Standard Discharge Bathroom Accessibility: Yes How Accessible: Accessible via walker Does the patient have any problems obtaining your medications?: No  Social/Family/Support Systems Patient Roles: Spouse Contact Information: spouse, "Lib", is frail  [phone] Anticipated Caregiver: daughter is primary contact: [place]  [phone] Anticipated Caregiver's Contact Information: see above Ability/Limitations of Caregiver: spouse is the only local family and she can only provide supervision at best Caregiver Availability: 24/7 Discharge Plan Discussed with Primary Caregiver: Yes Is Caregiver In Agreement with Plan?: Yes Does Caregiver/Family have Issues with Lodging/Transportation while Pt is in Rehab?: No   Goals Patient/Family Goal for Rehab: PT/OT/SLP mod I Expected length of stay: 5-7 days Additional Information: Discharge plan: mod I goals, will d/c home with spouse.  Has a supportive daughter but she does not live locally Pt/Family Agrees to Admission and willing to participate: Yes Program Orientation Provided & Reviewed with Pt/Caregiver Including Roles  & Responsibilities: Yes   Decrease burden of Care through IP rehab admission: n/a   Possible need for SNF placement upon discharge:Not anticipated.  Plan for home mod I, spouse 24/7 can provide supervision only.  Has a supportive daughter who lives out of town.    Patient Condition: This patient's condition remains as documented in the consult dated 02/27/23, in which the Rehabilitation Physician determined and documented that the patient's condition is appropriate for intensive rehabilitative care in an inpatient rehabilitation facility. Will admit to inpatient rehab ***.  Preadmission Screen Completed By:  Stephania Fragmin, PT, DPT 02/27/2023 3:52 PM ______________________________________________________________________   Discussed status with Dr. Marland Kitchenon***at *** and  received approval for admission today.  Admission Coordinator:  Stephania Fragmin, time***/Date***

## 2023-02-27 NOTE — Discharge Instructions (Signed)
Follow with Primary MD [name], MD in 7 days   Get CBC, CMP, 2 view Chest X ray -  checked next visit with your primary MD   Activity: As tolerated with Full fall precautions use walker/cane & assistance as needed  Disposition Home    Diet: Heart Healthy    Special Instructions: If you have smoked or chewed Tobacco  in the last 2 yrs please stop smoking, stop any regular Alcohol  and or any Recreational drug use.  On your next visit with your primary care physician please Get Medicines reviewed and adjusted.  Please request your Prim.MD to go over all Hospital Tests and Procedure/Radiological results at the follow up, please get all Hospital records sent to your Prim MD by signing hospital release before you go home.  If you experience worsening of your admission symptoms, develop shortness of breath, life threatening emergency, suicidal or homicidal thoughts you must seek medical attention immediately by calling 911 or calling your MD immediately  if symptoms less severe.  You Must read complete instructions/literature along with all the possible adverse reactions/side effects for all the Medicines you take and that have been prescribed to you. Take any new Medicines after you have completely understood and accpet all the possible adverse reactions/side effects.   Do not drive when taking Pain medications.  Do not take more than prescribed Pain, Sleep and Anxiety Medications

## 2023-02-27 NOTE — Consult Note (Signed)
Physical Medicine and Rehabilitation Consult Reason for Consult:left sided weakness Referring Physician: Thedore Mins   HPI: Steve Thompson is a 89 y.o. male with a history of peripheral neuropathy, arthritis questionable myasthenia gravis who presented on 02/25/2023 with a fall after his left leg gave out.  He complained of left shoulder pain also at the time.  A code stroke was called.  CT of the head initially was negative.  Neurology was consulted and MRI was ordered.  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  Neurology recommended Plavix and aspirin for 3 weeks thereafter Plavix alone as well as cholesterol management.  Patient has experienced some improvement in his symptoms.  He does report ongoing left shoulder pain with exacerbation of his chronic arthritis.  He had wanted to go straight home.  However he still is requiring min assist for sit to stand transfers and for gait 75 feet using a rolling walker.  Therapy notes problems with dorsiflexion and leg clearance as well as staggering on the left side.  He also fatigue quite easily.  The patient lives with his wife in a townhome.  His wife is frail and can provide no physical assistance.   Review of Systems  HENT:  Positive for hearing loss.   Eyes:  Negative for blurred vision, double vision and discharge.  Respiratory:  Negative for cough.   Cardiovascular:  Negative for chest pain.  Gastrointestinal:  Negative for nausea and vomiting.  Genitourinary:  Negative for dysuria.  Musculoskeletal:  Positive for falls, joint pain and myalgias.  Skin:  Negative for rash.  Neurological:  Positive for sensory change and focal weakness.  Psychiatric/Behavioral:  Negative for depression.    Past Medical History:  Diagnosis Date   Arthritis    Hemorrhoids    Hypercalcemia    Hyperthyroidism    Peripheral neuropathy    Past Surgical History:  Procedure Laterality Date   HEMORRHOID SURGERY      PARATHYROIDECTOMY  10/13/10   Family History  Problem Relation Age of Onset   Cancer Sister        lung   Social History:  reports that he has quit smoking. He has never used smokeless tobacco. He reports that he does not drink alcohol and does not use drugs. Allergies: No Known Allergies Medications Prior to Admission  Medication Sig Dispense Refill   gabapentin (NEURONTIN) 300 MG capsule Take 300 mg by mouth 4 (four) times daily.  Takes 1 capsule in am  3 capsules at night ( around 7:30p)     loratadine (CLARITIN) 10 MG tablet 1 tablet Orally Once a day     SYNTHROID 88 MCG tablet Take 1 tablet (88 mcg total) by mouth daily. (Patient taking differently: Take 100 mcg by mouth daily.) 30 tablet 6   tamsulosin (FLOMAX) 0.4 MG CAPS capsule Take 0.4 mg by mouth daily.     [DISCONTINUED] aspirin 81 MG tablet Take 81 mg by mouth daily. Patient also taking a medication for cholesterol, but does not remember name or dosage. Will provide at next visit.      predniSONE (DELTASONE) 5 MG tablet Take 5 mg by mouth daily with breakfast.     pyridostigmine (MESTINON) 60 MG tablet Take 60 mg by mouth 3 (three) times daily. Patient not taking      Home: Home Living Family/patient expects to be discharged to:: Private residence Living Arrangements: Spouse/significant other Available Help at Discharge: Family, Available 24 hours/day Type of Home:  Other(Comment) (town home) Home Access: Stairs to enter Secretary/administrator of Steps: 2 Entrance Stairs-Rails: Right Home Layout: One level Bathroom Shower/Tub: Engineer, manufacturing systems: Standard Home Equipment: Agricultural consultant (2 wheels), [employer] - single point, [employer] - quad, Rollator (4 wheels), BSC/3in1, Shower seat, Grab bars - toilet, Grab bars - tub/shower, Hand held shower head  Functional History: Prior Function Prior Level of Function : Independent/Modified Independent (wife drives) Mobility Comments: was not using device prior to admit ADLs  Comments: B/D self Functional Status:  Mobility: Bed Mobility Overal bed mobility: Needs Assistance Bed Mobility: Sit to Supine, Supine to Sit Supine to sit: Contact guard Sit to supine: Contact guard assist General bed mobility comments: Pt had difficulty getting his left LE onto the bed. Needed incr time to complete. Transfers Overall transfer level: Needs assistance Equipment used: None Transfers: Sit to/from Stand, Bed to chair/wheelchair/BSC Sit to Stand: Min assist Bed to/from chair/wheelchair/BSC transfer type:: Step pivot Step pivot transfers: Min assist General transfer comment: Increased LOB to the right with  turning to transfer to the toilet without assistive device or use of rails. Ambulation/Gait Ambulation/Gait assistance: Min assist Gait Distance (Feet): 75 Feet Assistive device: Rolling walker (2 wheels), 1 person hand held assist, None Gait Pattern/deviations: Step-through pattern, Decreased step length - left, Decreased stance time - left, Decreased weight shift to left, Decreased dorsiflexion - left, Staggering left General Gait Details: Pt was able to ambulate needing 1 UE support initially for support on right UE. As pt fatigues, left LE with decr step length needing cues as pt unaware that left LE lagging behind. Pt continues to ambulate somewhat impulsively with lack of awareness of left LE weakness needing incr assist for safety.  LOB on return to chair.  Definitely recommend that pt have someone walk wiht him and use RW initially and issued gait belt as well. Pt unsafe to ambulate on his own. Gait velocity interpretation: 1.31 - 2.62 ft/sec, indicative of limited community ambulator    ADL: ADL Overall ADL's : Needs assistance/impaired Eating/Feeding: Independent, Sitting Grooming: Wash/dry hands, Wash/dry face, Standing, Minimal assistance Upper Body Bathing: Supervision/ safety, Sitting Lower Body Bathing: Minimal assistance, Sit to/from stand Upper Body  Dressing : Supervision/safety, Sitting Lower Body Dressing: Minimal assistance, Sit to/from stand Toilet Transfer: Minimal assistance, Ambulation, Regular Toilet Toileting- Clothing Manipulation and Hygiene: Minimal assistance, Sit to/from stand Functional mobility during ADLs: Minimal assistance (ambulation without assistive device) General ADL Comments: Pt with increased LOB to the left when turning to sit on the toilet without assistive device.  Pt with decreased awareness of balance deficits and increased fall risk.  Pt's granddaughter in at end of session and reports that pt's wife is recoveing from PNA currently and family will be helping provide initial supervision post discharge.  Cognition: Cognition Overall Cognitive Status: No family/caregiver present to determine baseline cognitive functioning Arousal/Alertness: Awake/alert Orientation Level: Oriented X4 Year: 2025 Month: February Day of Week: Correct Attention: Focused, Sustained Focused Attention: Appears intact Sustained Attention: Impaired Sustained Attention Impairment: Verbal basic Memory: Impaired Memory Impairment: Decreased short term memory Decreased Short Term Memory: Verbal basic Awareness: Impaired Executive Function: Organizing Organizing: Impaired Cognition Arousal: Alert Behavior During Therapy: WFL for tasks assessed/performed Overall Cognitive Status: No family/caregiver present to determine baseline cognitive functioning Area of Impairment: Safety/judgement, Awareness, Memory Memory: Decreased short-term memory Following Commands: Follows one step commands consistently, Follows multi-step commands with increased time Safety/Judgement: Decreased awareness of safety, Decreased awareness of deficits Awareness: Anticipatory Problem Solving: Requires verbal cues  General Comments: Pt with decreased awareness of balance deficits when asked.  Only able to recall 1/3 words after approximately 2 min delay.  No  words after 15 min delay, using the same words.  Blood pressure (!) 168/97, pulse 86, temperature 97.6 F (36.4 C), temperature source Oral, resp. rate 20, height 5\' 11"  (1.803 m), weight 83.9 kg, SpO2 93%. Physical Exam Constitutional:      General: He is not in acute distress. HENT:     Head: Normocephalic and atraumatic.     Nose: Nose normal.     Mouth/Throat:     Mouth: Mucous membranes are moist.  Eyes:     Extraocular Movements: Extraocular movements intact.     Conjunctiva/sclera: Conjunctivae normal.     Pupils: Pupils are equal, round, and reactive to light.  Cardiovascular:     Rate and Rhythm: Normal rate.  Pulmonary:     Effort: Pulmonary effort is normal.  Abdominal:     Palpations: Abdomen is soft.  Musculoskeletal:        General: Tenderness (left shoulder) present. Normal range of motion.     Cervical back: Normal range of motion.  Skin:    General: Skin is warm.     Coloration: Skin is not jaundiced.  Neurological:     Mental Status: He is alert.     Comments: Pt is alert and oriented x 3. Mild left central 7. Speech sl dysarthric. He is sl HOH. LUE notable for mild Pronator drift. He also has impaired FMC of LUE and LLE with FTN and HTS respectively. MMT: RUE 4+/5. LUE 4/5 Deltoid, biceps, triceps, wrist and hand. RLE 4 to 4+/5 prox to distal. LLE 4- HF, KE and 4/5 ADF/PF. Sl decrease in LT in both feet/ankles. DTR's 1+. No abnl resting tone.   Psychiatric:        Mood and Affect: Mood normal.        Behavior: Behavior normal.     Results for orders placed or performed during the hospital encounter of 02/25/23 (from the past 24 hours)  Comprehensive metabolic panel     Status: Abnormal   Collection Time: 02/27/23  4:40 AM  Result Value Ref Range   Sodium 137 135 - 145 mmol/L   Potassium 3.5 3.5 - 5.1 mmol/L   Chloride 102 98 - 111 mmol/L   CO2 26 22 - 32 mmol/L   Glucose, Bld 114 (H) 70 - 99 mg/dL   BUN 8 8 - 23 mg/dL   Creatinine, Ser 1.61 0.61 -  1.24 mg/dL   Calcium 9.0 8.9 - 09.6 mg/dL   Total Protein 6.1 (L) 6.5 - 8.1 g/dL   Albumin 3.6 3.5 - 5.0 g/dL   AST 35 15 - 41 U/L   ALT 18 0 - 44 U/L   Alkaline Phosphatase 51 38 - 126 U/L   Total Bilirubin 1.1 0.0 - 1.2 mg/dL   GFR, Estimated >04 >54 mL/min   Anion gap 9 5 - 15  CBC     Status: Abnormal   Collection Time: 02/27/23  4:40 AM  Result Value Ref Range   WBC 12.6 (H) 4.0 - 10.5 K/uL   RBC 4.99 4.22 - 5.81 MIL/uL   Hemoglobin 15.1 13.0 - 17.0 g/dL   HCT 09.8 11.9 - 14.7 %   MCV 87.2 80.0 - 100.0 fL   MCH 30.3 26.0 - 34.0 pg   MCHC 34.7 30.0 - 36.0 g/dL   RDW 82.9 56.2 - 13.0 %  Platelets 356 150 - 400 K/uL   nRBC 0.0 0.0 - 0.2 %   ECHOCARDIOGRAM COMPLETE Result Date: 02/26/2023    ECHOCARDIOGRAM REPORT   Patient Name:   Steve Thompson Date of Exam: 02/25/2023 Medical Rec #:  [id number]      Height:       69.0 in Accession #:    [id number]     Weight:       194.4 lb Date of Birth:  1931/03/11      BSA:          2.041 m Patient Age:    89 years       BP:           184/86 mmHg Patient Gender: M              HR:           75 bpm. Exam Location:  Inpatient Procedure: 2D Echo, Cardiac Doppler and Color Doppler Indications:    stroke  History:        Patient has prior history of Echocardiogram examinations, most                 recent 05/05/2015.  Sonographer:    Delcie Roch RDCS Referring Phys: [phone] STEPHEN K CHIU IMPRESSIONS  1. Left ventricular ejection fraction, by estimation, is 65 to 70%. The left ventricle has normal function. The left ventricle has no regional wall motion abnormalities. There is mild asymmetric left ventricular hypertrophy of the basal-septal segment. Indeterminate diastolic filling due to E-A fusion.  2. Right ventricular systolic function is normal. The right ventricular size is normal. There is normal pulmonary artery systolic pressure.  3. The mitral valve is grossly normal. Trivial mitral valve regurgitation. No evidence of mitral stenosis.  4. The  aortic valve is grossly normal. There is mild calcification of the aortic valve. Aortic valve regurgitation is trivial. Aortic valve sclerosis/calcification is present, without any evidence of aortic stenosis.  5. The inferior vena cava is normal in size with greater than 50% respiratory variability, suggesting right atrial pressure of 3 mmHg. FINDINGS  Left Ventricle: Left ventricular ejection fraction, by estimation, is 65 to 70%. The left ventricle has normal function. The left ventricle has no regional wall motion abnormalities. The left ventricular internal cavity size was normal in size. There is  mild asymmetric left ventricular hypertrophy of the basal-septal segment. Indeterminate diastolic filling due to E-A fusion. Right Ventricle: The right ventricular size is normal. No increase in right ventricular wall thickness. Right ventricular systolic function is normal. There is normal pulmonary artery systolic pressure. The tricuspid regurgitant velocity is 2.74 m/s, and  with an assumed right atrial pressure of 3 mmHg, the estimated right ventricular systolic pressure is 33.0 mmHg. Left Atrium: Left atrial size was normal in size. Right Atrium: Right atrial size was normal in size. Pericardium: There is no evidence of pericardial effusion. Mitral Valve: The mitral valve is grossly normal. Trivial mitral valve regurgitation. No evidence of mitral valve stenosis. Tricuspid Valve: The tricuspid valve is normal in structure. Tricuspid valve regurgitation is mild . No evidence of tricuspid stenosis. Aortic Valve: The aortic valve is grossly normal. There is mild calcification of the aortic valve. Aortic valve regurgitation is trivial. Aortic valve sclerosis/calcification is present, without any evidence of aortic stenosis. Pulmonic Valve: The pulmonic valve was normal in structure. Pulmonic valve regurgitation is not visualized. No evidence of pulmonic stenosis. Aorta: The aortic root is normal in size and  structure.  Venous: The inferior vena cava is normal in size with greater than 50% respiratory variability, suggesting right atrial pressure of 3 mmHg. IAS/Shunts: The atrial septum is grossly normal.  LEFT VENTRICLE PLAX 2D LVIDd:         3.90 cm   Diastology LVIDs:         2.70 cm   LV e' medial:    5.77 cm/s LV PW:         0.90 cm   LV E/e' medial:  9.6 LV IVS:        1.30 cm   LV e' lateral:   7.40 cm/s LVOT diam:     2.20 cm   LV E/e' lateral: 7.5 LV SV:         89 LV SV Index:   43 LVOT Area:     3.80 cm  RIGHT VENTRICLE             IVC RV Basal diam:  2.90 cm     IVC diam: 1.50 cm RV S prime:     11.50 cm/s TAPSE (M-mode): 2.7 cm LEFT ATRIUM             Index        RIGHT ATRIUM           Index LA diam:        3.70 cm 1.81 cm/m   RA Area:     20.50 cm LA Vol (A2C):   41.5 ml 20.33 ml/m  RA Volume:   58.20 ml  28.51 ml/m LA Vol (A4C):   45.2 ml 22.14 ml/m LA Biplane Vol: 45.2 ml 22.14 ml/m  AORTIC VALVE LVOT Vmax:   109.00 cm/s LVOT Vmean:  73.200 cm/s LVOT VTI:    0.233 m  AORTA Ao Root diam: 3.30 cm MV E velocity: 55.50 cm/s   TRICUSPID VALVE MV A velocity: 103.00 cm/s  TR Peak grad:   30.0 mmHg MV E/A ratio:  0.54         TR Vmax:        274.00 cm/s                              SHUNTS                             Systemic VTI:  0.23 m                             Systemic Diam: 2.20 cm Weston Brass MD Electronically signed by Weston Brass MD Signature Date/Time: 02/26/2023/9:44:19 PM    Final    CT ANGIO HEAD NECK W WO CM Result Date: 02/25/2023 CLINICAL DATA:  Initial evaluation for acute stroke. EXAM: CT ANGIOGRAPHY HEAD AND NECK WITH AND WITHOUT CONTRAST TECHNIQUE: Multidetector CT imaging of the head and neck was performed using the standard protocol during bolus administration of intravenous contrast. Multiplanar CT image reconstructions and MIPs were obtained to evaluate the vascular anatomy. Carotid stenosis measurements (when applicable) are obtained utilizing NASCET criteria, using the  distal internal carotid diameter as the denominator. RADIATION DOSE REDUCTION: This exam was performed according to the departmental dose-optimization program which includes automated exposure control, adjustment of the mA and/or kV according to patient size and/or use of iterative reconstruction technique. CONTRAST:  75mL OMNIPAQUE IOHEXOL 350 MG/ML SOLN COMPARISON:  CT and  MRI from earlier the same day. FINDINGS: CTA NECK FINDINGS Aortic arch: Visualized aortic arch within normal limits for caliber with standard 3 vessel morphology. Moderate to advanced atheromatous change about the arch and origin of the great vessels without hemodynamically significant stenosis. Right carotid system: Right common and internal carotid arteries are tortuous but patent without dissection. Moderate calcified plaque about the right carotid bulb with up to 45% stenosis by NASCET criteria. Left carotid system: Left common and internal carotid arteries are tortuous but patent without dissection. Bulky calcified plaque about the left carotid bulb with associated stenosis of up to 50% by NASCET criteria. Vertebral arteries: Both vertebral arteries arise from subclavian arteries. No significant proximal subclavian artery stenosis. Soft plaque at the origin of the right vertebral artery with moderate stenosis. Vertebral arteries are tortuous but patent distally without stenosis or dissection. Skeleton: No worrisome osseous lesions. Moderate cervical spondylosis, most pronounced at C6-7. Other neck: No other acute finding. Upper chest: No other acute finding. Review of the MIP images confirms the above findings CTA HEAD FINDINGS Anterior circulation: Moderate atheromatous change about the carotid siphons with up to mild stenoses bilaterally. A1 segments, anterior communicating artery complex, and anterior cerebral arteries widely patent. No M1 stenosis or occlusion. Distal MCA branches perfused and symmetric. Posterior circulation: Both V4  segments widely patent. Both PICA patent. Basilar patent without stenosis. Superior cerebellar and posterior cerebral arteries patent bilaterally. Venous sinuses: Patent allowing for timing the contrast bolus. Anatomic variants: None significant.  No aneurysm. Review of the MIP images confirms the above findings IMPRESSION: 1. Negative CTA for large vessel occlusion or other emergent finding. 2. Moderate atheromatous change about the carotid bifurcations with associated stenoses of up to 45% on the right and 50% on the left. 3. Moderate atheromatous change about the carotid siphons with up to mild stenoses bilaterally. 4. Diffuse tortuosity of the major arterial vasculature of the head and neck, suggesting chronic underlying hypertension. 5.  Aortic Atherosclerosis (ICD10-I70.0). Electronically Signed   By: Rise Mu M.D.   On: 02/25/2023 20:29   MR BRAIN WO CONTRAST Result Date: 02/25/2023 CLINICAL DATA:  Initial evaluation for acute neuro deficit, stroke suspected. EXAM: MRI HEAD WITHOUT CONTRAST TECHNIQUE: Multiplanar, multiecho pulse sequences of the brain and surrounding structures were obtained without intravenous contrast. COMPARISON:  Prior CTs from earlier the same day. FINDINGS: Brain: Generalized age-related cerebral atrophy. Patchy T2/FLAIR hyperintensity involving the periventricular deep white matter both cerebral hemispheres, consistent with chronic small vessel ischemic disease. Overall, changes are mild for age. 1.3 cm focus of restricted diffusion seen involving the right thalamocapsular region, consistent with an acute ischemic infarct. No associated hemorrhage or mass effect. No other evidence for acute or subacute ischemia. Gray-white matter differentiation otherwise maintained. No acute or chronic intracranial blood products. No mass lesion, midline shift or mass effect. No hydrocephalus or extra-axial fluid collection. Pituitary gland suprasellar region within normal limits.  Vascular: Major intracranial vascular flow voids are maintained. Skull and upper cervical spine: Craniocervical junction within normal limits. Bone marrow signal intensity normal. No scalp soft tissue abnormality. Sinuses/Orbits: Prior ocular lens replacement on the right. Scattered mucosal thickening present throughout the paranasal sinuses. No mastoid effusion. Other: None. IMPRESSION: 1. 1.3 cm acute ischemic nonhemorrhagic right thalamocapsular infarct. 2. Underlying age-related cerebral atrophy with mild chronic small vessel ischemic disease. Electronically Signed   By: Rise Mu M.D.   On: 02/25/2023 19:13   DG Shoulder Left Result Date: 02/25/2023 CLINICAL DATA:  Left shoulder pain. EXAM:  LEFT SHOULDER - 2+ VIEW COMPARISON:  None Available. FINDINGS: There is no acute fracture or dislocation. The bones are osteopenic. There is arthritic changes of the left shoulder with spurring of the left AC joint. The soft tissues are unremarkable. Atherosclerotic calcification of the aorta. IMPRESSION: 1. No acute fracture or dislocation. 2. Arthritic changes of the left shoulder. Electronically Signed   By: Elgie Collard M.D.   On: 02/25/2023 17:18   CT HEAD CODE STROKE WO CONTRAST Result Date: 02/25/2023 CLINICAL DATA:  Code stroke. Neuro deficit, acute, stroke suspected. New onset of left-sided weakness. EXAM: CT HEAD WITHOUT CONTRAST TECHNIQUE: Contiguous axial images were obtained from the base of the skull through the vertex without intravenous contrast. RADIATION DOSE REDUCTION: This exam was performed according to the departmental dose-optimization program which includes automated exposure control, adjustment of the mA and/or kV according to patient size and/or use of iterative reconstruction technique. COMPARISON:  MR head 01/12/2022. FINDINGS: Brain: Mild atrophy and progressive white matter disease is noted bilaterally. No acute infarct, hemorrhage, or mass lesion is present. Basal ganglia are  intact. Insular ribbon is normal. The ventricles are of normal size. No significant extraaxial fluid collection is present. The brainstem and cerebellum are within normal limits. Vascular: Atherosclerotic calcifications are present within the cavernous internal carotid arteries bilaterally. No hyperdense vessel is present. Skull: Calvarium is intact. No focal lytic or blastic lesions are present. No significant extracranial soft tissue lesion is present. Sinuses/Orbits: The paranasal sinuses and mastoid air cells are clear. Right lens replacement is present. Globes and orbits are otherwise within normal limits. ASPECTS [facility] Stroke Program Early CT Score) - Ganglionic level infarction (caudate, lentiform nuclei, internal capsule, insula, M1-M3 cortex): 7/7 - Supraganglionic infarction (M4-M6 cortex): 3/3 Total score (0-10 with 10 being normal): 10/10 IMPRESSION: 1. No acute intracranial abnormality or significant interval change. 2. Mild atrophy and progressive white matter disease. This likely reflects the sequela of chronic microvascular ischemia. 3. Aspects is 10/10 The above was relayed via text pager to Dr. Caryl Pina on 02/25/2023 at 14:33 . Electronically Signed   By: Marin Roberts M.D.   On: 02/25/2023 14:34    Assessment/Plan: Diagnosis: 89 year old male with acute right thalamo-capsular infarct Does the need for close, 24 hr/day medical supervision in concert with the patient's rehab needs make it unreasonable for this patient to be served in a less intensive setting? Yes Co-Morbidities requiring supervision/potential complications:  -History of myasthenia gravis -Osteoarthritis left shoulder -Hypertension -History of BPH Due to bladder management, bowel management, safety, skin/wound care, disease management, medication administration, and patient education, does the patient require 24 hr/day rehab nursing? Yes Does the patient require coordinated care of a physician, rehab nurse,  therapy disciplines of PT, OT, ?SLP to address physical and functional deficits in the context of the above medical diagnosis(es)? Yes Addressing deficits in the following areas: balance, endurance, locomotion, strength, transferring, bowel/bladder control, bathing, dressing, feeding, grooming, toileting, cognition, speech, and psychosocial support Can the patient actively participate in an intensive therapy program of at least 3 hrs of therapy per day at least 5 days per week? Yes The potential for patient to make measurable gains while on inpatient rehab is excellent Anticipated functional outcomes upon discharge from inpatient rehab are modified independent  with PT, modified independent with OT, modified independent with SLP. Estimated rehab length of stay to reach the above functional goals is: 5-7 days Anticipated discharge destination: Home Overall Rehab/Functional Prognosis: excellent  POST ACUTE RECOMMENDATIONS: This patient's condition is  appropriate for continued rehabilitative care in the following setting: CIR Patient has agreed to participate in recommended program. Yes Note that insurance prior authorization may be required for reimbursement for recommended care.  Comment: Patient needs to be modified independent when he returns home as his wife cannot provide any form of assistance as she has some health issues of her own.  His house is accessible and he appears motivated to progress.  He has begun to experience some motor return already. Rehab Admissions Coordinator to follow up.         I have personally performed a face to face diagnostic evaluation of this patient. Additionally, I have examined the patient's medical record including any pertinent labs and radiographic images.    Thanks,  Ranelle Oyster, MD 02/27/2023

## 2023-02-27 NOTE — Progress Notes (Signed)
Physical Therapy Treatment Patient Details Name: ROBERTS BON MRN: [id number] DOB: Jan 17, 1931 Today's Date: 02/27/2023   History of Present Illness 89 yo male admitted 2/9 found to have right thalamocapsular Infarct and left shoulder pain but negative for fx. PMH: peripheral neuropathy, hypothyroidism, myasthenia gravis, hld, and bph.    PT Comments  Pt received sitting in the recliner and agreeable to session. Pt demonstrates good progress towards functional mobility goals this session. Pt able to tolerate increased gait distance with and without RW support. Pt demonstrates increased instability without AD and during static standing marches requiring up to min A to maintain balance. Pt reports improved L shoulder pain, however continues to demonstrate slightly reduced active flexion compared to RUE. Pt continues to benefit from PT services to progress toward functional mobility goals.     If plan is discharge home, recommend the following: A little help with walking and/or transfers;Assistance with cooking/housework;Assist for transportation;Help with stairs or ramp for entrance   Can travel by private vehicle        Equipment Recommendations       Recommendations for Other Services       Precautions / Restrictions Precautions Precautions: Fall Restrictions Weight Bearing Restrictions Per Provider Order: No     Mobility  Bed Mobility               General bed mobility comments: Pt in recliner at beginning and end of session    Transfers Overall transfer level: Needs assistance Equipment used: None, Rolling walker (2 wheels) Transfers: Sit to/from Stand Sit to Stand: Contact guard assist           General transfer comment: From recliner with cues for hand placement and safety due to pt attempting to stand despite chair beginning to roll backwards    Ambulation/Gait Ambulation/Gait assistance: Contact guard assist Gait Distance (Feet): 200 Feet (+30) Assistive  device: Rolling walker (2 wheels), None Gait Pattern/deviations: Step-through pattern, Decreased stride length, Drifts right/left, Trunk flexed       General Gait Details: Pt completes most of gait trial with RW and final 54ft without AD, then additional short trial in room without AD. Pt requires cues for RW management and upright posture. Without AD, pt demonstrates increased instability and drifting, but no LOB. Pt demonstrates inconsistent step lengths and low foot clearance requiring CGA for safety throughout.      Balance Overall balance assessment: Needs assistance Sitting-balance support: No upper extremity supported, Feet supported Sitting balance-Leahy Scale: Fair     Standing balance support: No upper extremity supported, During functional activity, Bilateral upper extremity supported Standing balance-Leahy Scale: Fair Standing balance comment: with and without AD. Pt able to perform static standing marches without UE support with intermittent min A for balance. Increased difficulty with LLE SLS during high marches and some staggaring to the R noted                            Communication Communication Communication: No apparent difficulties  Cognition Arousal: Alert Behavior During Therapy: WFL for tasks assessed/performed, Impulsive   PT - Cognitive impairments: Safety/Judgement                       PT - Cognition Comments: Increased cues for safety throughout. Pt attempts to stand from chair despite it rolling backwards        Cueing    Exercises      General Comments  Pertinent Vitals/Pain Pain Assessment Pain Assessment: No/denies pain     PT Goals (current goals can now be found in the care plan section) Acute Rehab PT Goals Patient Stated Goal: to go home PT Goal Formulation: With patient Time For Goal Achievement: 03/12/23 Progress towards PT goals: Progressing toward goals    Frequency    Min 1X/week        AM-PAC PT "6 Clicks" Mobility   Outcome Measure  Help needed turning from your back to your side while in a flat bed without using bedrails?: A Little Help needed moving from lying on your back to sitting on the side of a flat bed without using bedrails?: A Little Help needed moving to and from a bed to a chair (including a wheelchair)?: A Little Help needed standing up from a chair using your arms (e.g., wheelchair or bedside chair)?: A Little Help needed to walk in hospital room?: A Little Help needed climbing 3-5 steps with a railing? : A Little 6 Click Score: 18    End of Session Equipment Utilized During Treatment: Gait belt Activity Tolerance: Patient tolerated treatment well Patient left: in chair;with family/visitor present;with call bell/phone within reach Nurse Communication: Mobility status PT Visit Diagnosis: Unsteadiness on feet (R26.81);Muscle weakness (generalized) (M62.81)     Time: 1610-9604 PT Time Calculation (min) (ACUTE ONLY): 20 min  Charges:    $Gait Training: 8-22 mins PT General Charges $$ ACUTE PT VISIT: 1 Visit                     Johny Shock, PTA Acute Rehabilitation Services Secure Chat Preferred  [phone]    Johny Shock 02/27/2023, 1:09 PM

## 2023-02-28 ENCOUNTER — Other Ambulatory Visit (HOSPITAL_COMMUNITY): Payer: Self-pay

## 2023-02-28 DIAGNOSIS — R29703 NIHSS score 3: Secondary | ICD-10-CM | POA: Diagnosis present

## 2023-02-28 DIAGNOSIS — G510 Bell's palsy: Secondary | ICD-10-CM | POA: Diagnosis present

## 2023-02-28 DIAGNOSIS — Z7982 Long term (current) use of aspirin: Secondary | ICD-10-CM | POA: Diagnosis not present

## 2023-02-28 DIAGNOSIS — G8194 Hemiplegia, unspecified affecting left nondominant side: Secondary | ICD-10-CM | POA: Diagnosis present

## 2023-02-28 DIAGNOSIS — Z7902 Long term (current) use of antithrombotics/antiplatelets: Secondary | ICD-10-CM | POA: Diagnosis not present

## 2023-02-28 DIAGNOSIS — M19012 Primary osteoarthritis, left shoulder: Secondary | ICD-10-CM | POA: Diagnosis present

## 2023-02-28 DIAGNOSIS — Z79899 Other long term (current) drug therapy: Secondary | ICD-10-CM | POA: Diagnosis not present

## 2023-02-28 DIAGNOSIS — G459 Transient cerebral ischemic attack, unspecified: Secondary | ICD-10-CM | POA: Diagnosis present

## 2023-02-28 DIAGNOSIS — W19XXXA Unspecified fall, initial encounter: Secondary | ICD-10-CM | POA: Diagnosis present

## 2023-02-28 DIAGNOSIS — I10 Essential (primary) hypertension: Secondary | ICD-10-CM | POA: Diagnosis present

## 2023-02-28 DIAGNOSIS — Z801 Family history of malignant neoplasm of trachea, bronchus and lung: Secondary | ICD-10-CM | POA: Diagnosis not present

## 2023-02-28 DIAGNOSIS — R9082 White matter disease, unspecified: Secondary | ICD-10-CM | POA: Diagnosis present

## 2023-02-28 DIAGNOSIS — G7000 Myasthenia gravis without (acute) exacerbation: Secondary | ICD-10-CM | POA: Diagnosis present

## 2023-02-28 DIAGNOSIS — N400 Enlarged prostate without lower urinary tract symptoms: Secondary | ICD-10-CM | POA: Diagnosis present

## 2023-02-28 DIAGNOSIS — I6381 Other cerebral infarction due to occlusion or stenosis of small artery: Secondary | ICD-10-CM | POA: Diagnosis present

## 2023-02-28 DIAGNOSIS — H9190 Unspecified hearing loss, unspecified ear: Secondary | ICD-10-CM | POA: Diagnosis present

## 2023-02-28 DIAGNOSIS — E039 Hypothyroidism, unspecified: Secondary | ICD-10-CM | POA: Diagnosis present

## 2023-02-28 DIAGNOSIS — Z79890 Hormone replacement therapy (postmenopausal): Secondary | ICD-10-CM | POA: Diagnosis not present

## 2023-02-28 DIAGNOSIS — Z87891 Personal history of nicotine dependence: Secondary | ICD-10-CM | POA: Diagnosis not present

## 2023-02-28 DIAGNOSIS — G629 Polyneuropathy, unspecified: Secondary | ICD-10-CM | POA: Diagnosis present

## 2023-02-28 DIAGNOSIS — R471 Dysarthria and anarthria: Secondary | ICD-10-CM | POA: Diagnosis present

## 2023-02-28 DIAGNOSIS — E785 Hyperlipidemia, unspecified: Secondary | ICD-10-CM | POA: Diagnosis present

## 2023-02-28 NOTE — Progress Notes (Signed)
Inpatient Rehab Admissions Coordinator:   Awaiting insurance determination from [facility].  Dr. Carlis Abbott to do peer to peer this AM.  Awaiting return call from [facility] MD.    Estill Dooms, PT, DPT Admissions Coordinator [phone] 02/28/23  11:37 AM

## 2023-02-28 NOTE — Progress Notes (Signed)
DC instructions, med list, and stroke education paperwork reviewed with patient and his daughter Cal.  Both verbalized understanding.  TOC meds given to pt.  PT has all belongings.  Brought down to the main entrance via wheelchair by this RN

## 2023-02-28 NOTE — TOC Transition Note (Signed)
Transition of Care [facility]) - Discharge Note   Patient Details  Name: Steve Thompson MRN: [id number] Date of Birth: 29-May-1931  [facility]) CM/SW Contact:  Gordy Clement, RN Phone Number: 02/28/2023, 3:27 PM   Clinical Narrative:    Patient will DC to home today .  Insurance has denied [employer] for [employer]. Peer to Peer completed and also denied. [facility] will provide Home Health PT/OT Aide and SW. Daughter or Granddaughter to transport  No DME needed. AVS updated            Patient Goals and CMS Choice            Discharge Placement                       Discharge Plan and Services Additional resources added to the After Visit Summary for                                       Social Drivers of Health (SDOH) Interventions SDOH Screenings   Food Insecurity: No Food Insecurity (02/26/2023)  Housing: Low Risk  (02/26/2023)  Transportation Needs: No Transportation Needs (02/26/2023)  Utilities: Not At Risk (02/26/2023)  Social Connections: Moderately Integrated (02/26/2023)  Tobacco Use: Medium Risk (02/25/2023)     Readmission Risk Interventions     No data to display

## 2023-02-28 NOTE — Plan of Care (Signed)
Problem: Education: Goal: Knowledge of disease or condition will improve Outcome: Completed/Met Goal: Knowledge of secondary prevention will improve (MUST DOCUMENT ALL) Outcome: Completed/Met Goal: Knowledge of patient specific risk factors will improve (DELETE if not current risk factor) Outcome: Completed/Met   Problem: Ischemic Stroke/TIA Tissue Perfusion: Goal: Complications of ischemic stroke/TIA will be minimized Outcome: Completed/Met   Problem: Coping: Goal: Will verbalize positive feelings about self Outcome: Completed/Met Goal: Will identify appropriate support needs Outcome: Completed/Met   Problem: Health Behavior/Discharge Planning: Goal: Ability to manage health-related needs will improve Outcome: Completed/Met Goal: Goals will be collaboratively established with patient/family Outcome: Completed/Met   Problem: Self-Care: Goal: Ability to participate in self-care as condition permits will improve Outcome: Completed/Met Goal: Verbalization of feelings and concerns over difficulty with self-care will improve Outcome: Completed/Met Goal: Ability to communicate needs accurately will improve Outcome: Completed/Met   Problem: Nutrition: Goal: Risk of aspiration will decrease Outcome: Completed/Met Goal: Dietary intake will improve Outcome: Completed/Met   Problem: Education: Goal: Knowledge of General Education information will improve Description: Including pain rating scale, medication(s)/side effects and non-pharmacologic comfort measures Outcome: Completed/Met   Problem: Health Behavior/Discharge Planning: Goal: Ability to manage health-related needs will improve Outcome: Completed/Met   Problem: Clinical Measurements: Goal: Ability to maintain clinical measurements within normal limits will improve Outcome: Completed/Met Goal: Will remain free from infection Outcome: Completed/Met Goal: Diagnostic test results will improve Outcome:  Completed/Met Goal: Respiratory complications will improve Outcome: Completed/Met Goal: Cardiovascular complication will be avoided Outcome: Completed/Met   Problem: Activity: Goal: Risk for activity intolerance will decrease Outcome: Completed/Met   Problem: Nutrition: Goal: Adequate nutrition will be maintained Outcome: Completed/Met   Problem: Coping: Goal: Level of anxiety will decrease Outcome: Completed/Met   Problem: Elimination: Goal: Will not experience complications related to bowel motility Outcome: Completed/Met Goal: Will not experience complications related to urinary retention Outcome: Completed/Met   Problem: Pain Managment: Goal: General experience of comfort will improve and/or be controlled Outcome: Completed/Met   Problem: Safety: Goal: Ability to remain free from injury will improve Outcome: Completed/Met   Problem: Skin Integrity: Goal: Risk for impaired skin integrity will decrease Outcome: Completed/Met

## 2023-02-28 NOTE — H&P (Incomplete)
Physical Medicine and Rehabilitation Admission H&P   CC: Functional deficits secondary to ight thalamocapsular infarct  HPI: Steve Thompson is an 89 year old male who presented to the ED via EMS on 02/25/2023 as code stroke. He was at home, got out of his chair and LLE gave out and he fell. Left facial droop, no other injuries. Seen by Dr. Otelia Limes. LUE and LLE weakness. CT head without acute abnormality. He declined TNK due to risk of ICH. Maintained at home on aspirin and started DAPT. MRI revealed right thalamocapsular infarct. CTA head and neck with no LVO or significant ICA stenosis. Hgb A1c  = 5.7%, EF = 65-70%. Tolerating heart healthy diet, thin liquids. Aspirin 81 mg daily and clopidogrel 75 mg daily for 3 weeks and then Plavix alone. Patient currently at min guard to supervision for showering and dressing tasks sit to stand. He demonstrates increased instability without AD and during static standing marches requiring up to min A to maintain balance. The patient requires inpatient medicine and rehabilitation evaluations and services for ongoing dysfunction secondary to ight thalamocapsular infarct.  PMH includes   ROS Past Medical History:  Diagnosis Date   Arthritis    Hemorrhoids    Hypercalcemia    Hyperthyroidism    Peripheral neuropathy    Past Surgical History:  Procedure Laterality Date   HEMORRHOID SURGERY     PARATHYROIDECTOMY  10/13/10   Family History  Problem Relation Age of Onset   Cancer Sister        lung   Social History:  reports that he has quit smoking. He has never used smokeless tobacco. He reports that he does not drink alcohol and does not use drugs. Allergies: No Known Allergies Medications Prior to Admission  Medication Sig Dispense Refill   gabapentin (NEURONTIN) 300 MG capsule Take 300 mg by mouth 4 (four) times daily.  Takes 1 capsule in am  3 capsules at night ( around 7:30p)     loratadine (CLARITIN) 10 MG tablet 1 tablet Orally Once a day      SYNTHROID 88 MCG tablet Take 1 tablet (88 mcg total) by mouth daily. (Patient taking differently: Take 100 mcg by mouth daily.) 30 tablet 6   tamsulosin (FLOMAX) 0.4 MG CAPS capsule Take 0.4 mg by mouth daily.     [DISCONTINUED] aspirin 81 MG tablet Take 81 mg by mouth daily. Patient also taking a medication for cholesterol, but does not remember name or dosage. Will provide at next visit.      predniSONE (DELTASONE) 5 MG tablet Take 5 mg by mouth daily with breakfast.     pyridostigmine (MESTINON) 60 MG tablet Take 60 mg by mouth 3 (three) times daily. Patient not taking        Home: Home Living Family/patient expects to be discharged to:: Private residence Living Arrangements: Spouse/significant other Available Help at Discharge: Family, Available 24 hours/day Type of Home: Other(Comment) (town home) Home Access: Stairs to enter Secretary/administrator of Steps: 2 Entrance Stairs-Rails: Right Home Layout: One level Bathroom Shower/Tub: Engineer, manufacturing systems: Standard Home Equipment: Agricultural consultant (2 wheels), [employer] - single point, [employer] - quad, Rollator (4 wheels), BSC/3in1, Shower seat, Grab bars - toilet, Grab bars - tub/shower, Hand held shower head   Functional History: Prior Function Prior Level of Function : Independent/Modified Independent (wife drives) Mobility Comments: was not using device prior to admit ADLs Comments: B/D self  Functional Status:  Mobility: Bed Mobility Overal bed mobility: Needs Assistance Bed Mobility:  Sit to Supine, Supine to Sit Supine to sit: Contact guard Sit to supine: Contact guard assist General bed mobility comments: Pt in recliner at beginning and end of session Transfers Overall transfer level: Needs assistance Equipment used: None Transfers: Sit to/from Stand Sit to Stand: Supervision Bed to/from chair/wheelchair/BSC transfer type:: Step pivot Step pivot transfers: Contact guard assist General transfer comment: Pt ambulates  at a slower rate of speed. Ambulation/Gait Ambulation/Gait assistance: Contact guard assist Gait Distance (Feet): 200 Feet (+30) Assistive device: Rolling walker (2 wheels), None Gait Pattern/deviations: Step-through pattern, Decreased stride length, Drifts right/left, Trunk flexed General Gait Details: Pt completes most of gait trial with RW and final 22ft without AD, then additional short trial in room without AD. Pt requires cues for RW management and upright posture. Without AD, pt demonstrates increased instability and drifting, but no LOB. Pt demonstrates inconsistent step lengths and low foot clearance requiring CGA for safety throughout. Gait velocity interpretation: 1.31 - 2.62 ft/sec, indicative of limited community ambulator    ADL: ADL Overall ADL's : Needs assistance/impaired Eating/Feeding: Independent, Sitting Grooming: Wash/dry hands, Wash/dry face, Standing, Minimal assistance, Supervision/safety Upper Body Bathing: Supervision/ safety, Standing Lower Body Bathing: Contact guard assist (standing in the shower with use of grab bar) Upper Body Dressing : Set up, Sitting Lower Body Dressing: Contact guard assist, Sit to/from stand Lower Body Dressing Details (indicate cue type and reason): LOB to the left when attemtping to donn left gripper sock from the recliner Toilet Transfer: Minimal assistance, Ambulation, Regular Toilet Toileting- Clothing Manipulation and Hygiene: Minimal assistance, Sit to/from stand Tub/ Shower Transfer: Walk-in shower, Contact guard assist, Ambulation Functional mobility during ADLs: Contact guard assist (No assistive device ambulating in the room) General ADL Comments: Pt able to sequence through bathing and dressing tasks with min questioning cueing.  One LOB to the left when reaching down beside of the bed to pull clothes out of his bag.  Slight decreased LUE FM coordination noted when attempting to donn his gripper sock.  He had to regrip X3 as  the end slipped out of his fingers.  Discussed benefit of shower seat/bench for safety at home and daughter is in agreement and will likely purchase from outside source.  Cognition: Cognition Overall Cognitive Status: No family/caregiver present to determine baseline cognitive functioning Arousal/Alertness: Awake/alert Orientation Level: Oriented X4 Year: 2025 Month: February Day of Week: Correct Attention: Focused, Sustained Focused Attention: Appears intact Sustained Attention: Impaired Sustained Attention Impairment: Verbal basic Memory: Impaired Memory Impairment: Decreased short term memory Decreased Short Term Memory: Verbal basic Awareness: Impaired Executive Function: Organizing Organizing: Impaired Cognition Arousal: Alert Behavior During Therapy: WFL for tasks assessed/performed Overall Cognitive Status: No family/caregiver present to determine baseline cognitive functioning Area of Impairment: Safety/judgement, Awareness, Memory Memory: Decreased short-term memory Following Commands: Follows one step commands consistently, Follows multi-step commands with increased time Safety/Judgement: Decreased awareness of safety, Decreased awareness of deficits Awareness: Anticipatory Problem Solving: Requires verbal cues General Comments: Pt with decreased awareness of balance deficits when asked.  Only able to recall 1/3 words after approximately 2 min delay.  No words after 15 min delay, using the same words.  Physical Exam: Blood pressure (!) 175/87, pulse (!) 113, temperature 98 F (36.7 C), temperature source Oral, resp. rate 18, height 5\' 11"  (1.803 m), weight 83.9 kg, SpO2 90%. Physical Exam  Results for orders placed or performed during the hospital encounter of 02/25/23 (from the past 48 hours)  Comprehensive metabolic panel     Status: Abnormal  Collection Time: 02/27/23  4:40 AM  Result Value Ref Range   Sodium 137 135 - 145 mmol/L   Potassium 3.5 3.5 - 5.1  mmol/L   Chloride 102 98 - 111 mmol/L   CO2 26 22 - 32 mmol/L   Glucose, Bld 114 (H) 70 - 99 mg/dL    Comment: Glucose reference range applies only to samples taken after fasting for at least 8 hours.   BUN 8 8 - 23 mg/dL   Creatinine, Ser 4.09 0.61 - 1.24 mg/dL   Calcium 9.0 8.9 - 81.1 mg/dL   Total Protein 6.1 (L) 6.5 - 8.1 g/dL   Albumin 3.6 3.5 - 5.0 g/dL   AST 35 15 - 41 U/L   ALT 18 0 - 44 U/L   Alkaline Phosphatase 51 38 - 126 U/L   Total Bilirubin 1.1 0.0 - 1.2 mg/dL   GFR, Estimated >91 >47 mL/min    Comment: (NOTE) Calculated using the CKD-EPI Creatinine Equation (2021)    Anion gap 9 5 - 15    Comment: Performed at [facility], [address], [place], [place] [address]  CBC     Status: Abnormal   Collection Time: 02/27/23  4:40 AM  Result Value Ref Range   WBC 12.6 (H) 4.0 - 10.5 K/uL   RBC 4.99 4.22 - 5.81 MIL/uL   Hemoglobin 15.1 13.0 - 17.0 g/dL   HCT 21.3 08.6 - 57.8 %   MCV 87.2 80.0 - 100.0 fL   MCH 30.3 26.0 - 34.0 pg   MCHC 34.7 30.0 - 36.0 g/dL   RDW 46.9 62.9 - 52.8 %   Platelets 356 150 - 400 K/uL   nRBC 0.0 0.0 - 0.2 %    Comment: Performed at [facility], [address], [place], [place] [address]   No results found.    Blood pressure (!) 175/87, pulse (!) 113, temperature 98 F (36.7 C), temperature source Oral, resp. rate 18, height 5\' 11"  (1.803 m), weight 83.9 kg, SpO2 90%.  Medical Problem List and Plan: 1. Functional deficits secondary to ***  -patient may *** shower  -ELOS/Goals: ***  2.  Antithrombotics: -DVT/anticoagulation:  Pharmaceutical: Lovenox  -antiplatelet therapy: Aspirin and Plavix for three weeks followed by Plavix alone  3. Pain Management: Tylenol as needed  4. Mood/Behavior/Sleep: LCSW to evaluate and provide emotional support  -antipsychotic agents: n/a  5. Neuropsych/cognition: This patient *** capable of making decisions on *** own behalf.  6. Skin/Wound Care: Routine skin care checks   7.  Fluids/Electrolytes/Nutrition: Routine Is and Os and follow-up chemistries  8: Myasthenia gravis: Mestinon, prednisone  9: Hyperlipidemia: continue statin  10: BPH/urinary frequency: continue Flomax  11: Hypothyroidism: continue Synthroid  12: Neuropathy: continue Neurontin   ***  Milinda Antis, PA-C 02/28/2023

## 2023-02-28 NOTE — Plan of Care (Signed)
  Problem: Education: Goal: Knowledge of disease or condition will improve Outcome: Progressing Goal: Knowledge of secondary prevention will improve (MUST DOCUMENT ALL) Outcome: Progressing Goal: Knowledge of patient specific risk factors will improve (DELETE if not current risk factor) Outcome: Progressing   Problem: Ischemic Stroke/TIA Tissue Perfusion: Goal: Complications of ischemic stroke/TIA will be minimized Outcome: Progressing   Problem: Coping: Goal: Will verbalize positive feelings about self Outcome: Progressing   Problem: Health Behavior/Discharge Planning: Goal: Goals will be collaboratively established with patient/family Outcome: Progressing   Problem: Nutrition: Goal: Risk of aspiration will decrease Outcome: Progressing Goal: Dietary intake will improve Outcome: Progressing

## 2023-02-28 NOTE — Progress Notes (Addendum)
Inpatient Rehab Admissions Coordinator:   Received a denial from medical director at [employer] stating patient does not meet medical criteria for CIR, despite having an acute CVA.  I notified TOC who will notify pt/family.  Likely will d/c directly home with max HH services to maximize safety and reduce caregiver burden.   Estill Dooms, PT, DPT Admissions Coordinator [phone] 02/28/23  3:11 PM

## 2023-02-28 NOTE — Progress Notes (Signed)
[facility]                                                                                                                                                                         Patient Demographics  Steve Thompson, is a 89 y.o. male  WUJ:[id number]  NFA:[id number]  DOB - Oct 02, 1931  Admit date - 02/25/2023  Admitting Physician Steve Kief, MD  Outpatient Primary MD for the patient is Steve Floro, MD  LOS - 0   Chief Complaint  Patient presents with   Code Stroke        Assessment & Plan    Patient seen briefly today due for discharge soon per Discharge done yesterday by me, no further issues, Vital signs stable, patient feels fine.  He still went back and forth between CIR at home, says his family is forcing him to go to CIR, after refusing to go to CIR again this morning has now agreed to go to CIR at 9:30 AM after again talking to his wife and his daughter.  RN and case management in the room.    Medications  Scheduled Meds:   stroke: early stages of recovery book   Does not apply Once   aspirin EC  81 mg Oral Daily   clopidogrel  75 mg Oral Daily   enoxaparin (LOVENOX) injection  40 mg Subcutaneous Q24H   levothyroxine  100 mcg Oral Daily   pneumococcal 20-valent conjugate vaccine  0.5 mL Intramuscular Tomorrow-1000   rosuvastatin  20 mg Oral Daily   tamsulosin  0.4 mg Oral Daily   Continuous Infusions: PRN Meds:.acetaminophen **OR** acetaminophen (TYLENOL) oral liquid 160 mg/5 mL **OR** acetaminophen, hydrALAZINE, melatonin, senna-docusate    Time Spent in minutes   10 minutes   Steve Thompson M.D on 02/28/2023 at 9:29 AM  Between 7am to 7pm - Pager - [phone]  After 7pm go to [email or web address] - password [email or web address]  And look for the night coverage person covering for me after hours  [facility] Office  [phone]    Subjective:   Steve Thompson today has, No  headache, No chest pain, No abdominal pain - No Nausea, No new weakness tingling or numbness, No Cough - SOB.    Objective:   Vitals:   02/26/23 2326 02/27/23 0329 02/27/23 2010 02/28/23 0011  BP: (!) 148/77 (!) 168/97 (!) 149/93 (!) 175/87  Pulse: 81 86 83 (!) 113  Resp: 14 20 19 18   Temp: 97.9 F (36.6 C) 97.6 F (36.4 C) 98.4 F (36.9 C) 98 F (36.7 C)  TempSrc: Oral Oral Oral Oral  SpO2: 92% 93% 90% 90%  Weight:      Height:  Wt Readings from Last 3 Encounters:  02/25/23 83.9 kg  11/02/22 83.8 kg  08/23/22 83.4 kg     Intake/Output Summary (Last 24 hours) at 02/28/2023 0929 Last data filed at 02/28/2023 0500 Gross per 24 hour  Intake --  Output 150 ml  Net -150 ml    Exam  Awake Alert, No new F.N deficits minimal comparative weakness in the left upper extremity strength 5/5, Normal affect [place].AT,PERRAL Supple Neck, No JVD,   Symmetrical Chest wall movement, Good air movement bilaterally, CTAB RRR,No Gallops, Rubs or new Murmurs,  +ve B.Sounds, Abd Soft, No tenderness,   No Cyanosis, Clubbing or edema

## 2023-02-28 NOTE — Progress Notes (Signed)
Physical Therapy Treatment Patient Details Name: Steve Thompson MRN: [id number] DOB: Sep 16, 1931 Today's Date: 02/28/2023   History of Present Illness Pt is an 89 yo male admitted 2/9 found to have right thalamocapsular Infarct and left shoulder pain but negative for fx. PMH: peripheral neuropathy, hypothyroidism, myasthenia gravis, hld, and bph.    PT Comments  Pt is making good progress towards functional mobility goals. Pt able to tolerate increased gait distance without AD this session, however continues to demonstrate increased instability and impaired LLE coordination causing increased fall risk. Pt able to perform 2 trials of static standing marches for focus on balance with pt requiring CGA for safety due to increased instability, but no LOB. Pt demonstrates decreased awareness of safety and deficits during session requiring increased cues at times. Pt also demonstrates some slightly impaired processing requiring increased cues for task instructions at times. Pt continues to benefit from PT services to progress toward functional mobility goals.    If plan is discharge home, recommend the following: A little help with walking and/or transfers;Assistance with cooking/housework;Assist for transportation;Help with stairs or ramp for entrance   Can travel by private vehicle        Equipment Recommendations       Recommendations for Other Services       Precautions / Restrictions Precautions Precautions: Fall Restrictions Weight Bearing Restrictions Per Provider Order: No     Mobility  Bed Mobility               General bed mobility comments: Pt in recliner at end of session    Transfers                        Ambulation/Gait Ambulation/Gait assistance: Contact guard assist Gait Distance (Feet): 350 Feet Assistive device: Rolling walker (2 wheels), None Gait Pattern/deviations: Step-through pattern, Decreased stride length, Drifts right/left, Trunk flexed        General Gait Details: Pt demonstrates increased instability without AD. pt with decreased LLE coordination causing intermittent slight staggering, but no LOB. Cues for increased foot clearance.      Balance Overall balance assessment: Needs assistance Sitting-balance support: No upper extremity supported, Feet supported Sitting balance-Leahy Scale: Good     Standing balance support: No upper extremity supported, During functional activity Standing balance-Leahy Scale: Fair Standing balance comment: without AD during ambulation. pt able to perform static standing marches without UE support with CGA for safety due to increased instability, but no LOB                            Communication Communication Communication: No apparent difficulties  Cognition Arousal: Alert Behavior During Therapy: WFL for tasks assessed/performed   PT - Cognitive impairments: Safety/Judgement                                Cueing    Exercises      General Comments        Pertinent Vitals/Pain Pain Assessment Pain Assessment: Faces Faces Pain Scale: Hurts a little bit Pain Location: L scapula Pain Descriptors / Indicators: Discomfort, Aching Pain Intervention(s): Monitored during session     PT Goals (current goals can now be found in the care plan section) Acute Rehab PT Goals Patient Stated Goal: to go home PT Goal Formulation: With patient Time For Goal Achievement: 03/12/23 Progress towards PT goals: Progressing  toward goals    Frequency    Min 1X/week       AM-PAC PT "6 Clicks" Mobility   Outcome Measure  Help needed turning from your back to your side while in a flat bed without using bedrails?: A Little Help needed moving from lying on your back to sitting on the side of a flat bed without using bedrails?: A Little Help needed moving to and from a bed to a chair (including a wheelchair)?: A Little Help needed standing up from a chair using  your arms (e.g., wheelchair or bedside chair)?: A Little Help needed to walk in hospital room?: A Little Help needed climbing 3-5 steps with a railing? : A Little 6 Click Score: 18    End of Session Equipment Utilized During Treatment: Gait belt Activity Tolerance: Patient tolerated treatment well Patient left: in chair;with family/visitor present;with call bell/phone within reach Nurse Communication: Mobility status PT Visit Diagnosis: Unsteadiness on feet (R26.81);Muscle weakness (generalized) (M62.81)     Time: 1350-1409 PT Time Calculation (min) (ACUTE ONLY): 19 min  Charges:    $Gait Training: 8-22 mins PT General Charges $$ ACUTE PT VISIT: 1 Visit                     Johny Shock, PTA Acute Rehabilitation Services Secure Chat Preferred  [phone]    Johny Shock 02/28/2023, 2:38 PM

## 2023-03-01 DIAGNOSIS — I69398 Other sequelae of cerebral infarction: Secondary | ICD-10-CM | POA: Diagnosis not present

## 2023-03-01 DIAGNOSIS — R29898 Other symptoms and signs involving the musculoskeletal system: Secondary | ICD-10-CM | POA: Diagnosis not present

## 2023-03-01 DIAGNOSIS — M19012 Primary osteoarthritis, left shoulder: Secondary | ICD-10-CM | POA: Diagnosis not present

## 2023-03-01 DIAGNOSIS — I119 Hypertensive heart disease without heart failure: Secondary | ICD-10-CM | POA: Diagnosis not present

## 2023-03-01 DIAGNOSIS — Z9181 History of falling: Secondary | ICD-10-CM | POA: Diagnosis not present

## 2023-03-01 DIAGNOSIS — N400 Enlarged prostate without lower urinary tract symptoms: Secondary | ICD-10-CM | POA: Diagnosis not present

## 2023-03-01 DIAGNOSIS — E785 Hyperlipidemia, unspecified: Secondary | ICD-10-CM | POA: Diagnosis not present

## 2023-03-01 DIAGNOSIS — Z7982 Long term (current) use of aspirin: Secondary | ICD-10-CM | POA: Diagnosis not present

## 2023-03-01 DIAGNOSIS — Z7952 Long term (current) use of systemic steroids: Secondary | ICD-10-CM | POA: Diagnosis not present

## 2023-03-01 DIAGNOSIS — I083 Combined rheumatic disorders of mitral, aortic and tricuspid valves: Secondary | ICD-10-CM | POA: Diagnosis not present

## 2023-03-01 DIAGNOSIS — E890 Postprocedural hypothyroidism: Secondary | ICD-10-CM | POA: Diagnosis not present

## 2023-03-01 DIAGNOSIS — G629 Polyneuropathy, unspecified: Secondary | ICD-10-CM | POA: Diagnosis not present

## 2023-03-01 DIAGNOSIS — Z7902 Long term (current) use of antithrombotics/antiplatelets: Secondary | ICD-10-CM | POA: Diagnosis not present

## 2023-03-05 NOTE — Progress Notes (Unsigned)
 No chief complaint on file.   ASSESSMENT AND PLAN  Steve Thompson is a 89 y.o. male  Seropositive myasthenia gravis  Present since 09/2021 - Mainly ocular symptoms,  Confirmed by positive acetylcholine binding antibody December 2023, 7.27  Weaned off prednisone back in 04/2022, about 1 month later he had recurrent diplopia, improved after higher dose of prednisone, was able to gradual taper back off in 10/2022 without any recurrent symptoms Per Steve Thompson recommendations, will further taper down prednisone from 5mg  to 2.5mg  for 2 weeks then stop. 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 Ongoing refills will be obtained by PCP Continue to follow with PCP for stroke risk factor management  Carotid stenosis CTA neck 02/2023 R ICA 45% stenosis, L ICA 50% stenosis Recommend completion of carotid ultrasound in 6 months for surveillance monitoring Discussed importance of aggressive stroke risk factor management        Follow-up in February as scheduled       DIAGNOSTIC DATA (LABS, IMAGING, TESTING) - I reviewed patient records, labs, notes, testing and imaging myself where available.  Lab work 08/2022: CMP and CBC/D WNL, A1c 6.1 (previously 5.8)   Laboratory evaluations from last home January 02, 2022, normal thyroid peroxidase, TSH was mildly elevated 4.91, normal T3 86,  C-reactive protein 1.6, positive acetylcholine receptor binding antibody 7.27, negative protein, moderate antibody, normal CBC, hemoglobin of 15.9,   MEDICAL HISTORY:   Update 03/06/2023 Steve Thompson: Patient returns for follow-up visit.  At prior visit, planned on gradually tapering off prednisone and continued on Mestinon 30 mg 3 times daily.  He remains off prednisone, has not had any recurrent diplopia.   Of note, patient was seen in the ED on 2/9 for acute onset left leg weakness and facial droop.  He was found to have right thalamocapsular infarct secondary to small vessel disease.  CTA head/neck showed right ICA 45% stenosis and left ICA 50% stenosis.  EF 65 to 70%.  LDL 126.  A1c 5.7.  Placed on DAPT for 3 weeks then Plavix alone as well as added Crestor 20 mg daily.  Therapies recommended CIR but insurance declined and was discharged home with home health therapies.       History provided for reference purposes only Update 11/02/2022 Steve Thompson: Patient returns for follow-up visit after prior visit with Steve Thompson 2 months ago.  He is accompanied by his wife.  At prior visit, recommended further reducing prednisone and added CellCept in addition to Mestinon as needed.  Unfortunately, CellCept declined by patient's insurance therefore switched Imuran but this was also denied by patient's insurance. Currently taking prednisone 5mg  and Mestinon 0.5 tab 1-3 times per day as needed. Denies any current visual concerns, does c/o dry eye is is chronic.  Denies any limb muscle weakness, swallowing or chewing difficulties.  Wife also mentions continued short-term memory loss present over the  past several years, denies any progression.  Previously completed workup with MRI brain which showed mild age-related changes but otherwise unremarkable.  Lab work for reversible causes unremarkable.  UPDATE August 7th 2024 Steve Thompson: He had a recurrent double vision around June 2024, a month after he stopped taking prednisone,  even taking Mestinon 30 to 60 mg 3 times a day without helping his double vision and blurry vision, complains of GI side effect with 60 mg  He was started on prednisone 20 mg daily tapering following his visit with Steve Thompson August 01, 2022, gradually noticed improvement, now on 10 mg daily, Mestinon 30 mg 3 times a day, no longer has visual issues  He denies bulbar limb muscle weakness,  Update 08/01/2022 Steve Thompson: Patient returns for sooner visit accompanied by his wife due to complaints of recurrence of diplopia that started about 2 weeks ago.  He has been off prednisone therapy since April and doing well up until this time. Once diplopia restarted, he started taking 1/2 tab of mestinon for 1 week and tried to increase to full tab last week but made symptoms worse therefore stopped 2 days ago Denies eye lid fatigue. Able to see better with one eye closed, has double and blurred vision with both eyes open.  Was seen by Steve Thompson this morning who noted right hypertropia. Denies limb weakness or any other symptoms. Continues to have mild unsteadiness and imbalance. Unchanged but per wife, can fluctuate with more severe unsteadiness at times which can lead to falls, thankfully denies injury.   UPDATE May 03 2022 Dr Steve Thompson: He is with his wife at today's clinical visit, overall stable, has used up all his prednisone supply, no longer taking it, did not notice flareup of his muscle weakness, diplopia, last dose of Mestinon was 2 days ago, continue have memory loss, but denied chewing difficulty, mild unsteady gait which is at his baseline   Today's examination showed no significant bulbar, extraocular muscle, or limb muscle weakness.   Laboratory evaluations, normal CMP with exception of slight elevation of calcium 10.5, A1c 5.8, normal TSH, negative RPR, normal B12 528, positive acetylcholine binding antibody 7.4, blocking antibody 40, modulating antibody 63 all elevated,   Update 01/31/2022 Steve Thompson: Steve Thompson, is a 89 year old male, accompanied by his wife, seen in request by his ophthalmologist Steve Thompson for evaluation of double vision, his primary care physician is [facility] physician Dr. Tenny Craw, Darlen Round, for evaluation was on January 31, 2022  I reviewed and summarized the referring note. PMHX. HLD Hypothyrodism Parathyroidectomy in 2010.  He had right cataract surgery in September 2023 for complaints of worsening drowsiness, since surgery, he complains of continued blurriness, also noticed intermittent double vision, droopy eyelid, he has to close 1 eye to make the picture sharp,  Patient denies bulbar weakness, denies difficulty chewing, swallowing, no significant muscle weakness, already has baseline mild unsteady gait, wife also reported over the past couple years, he has noticeable slow worsening memory loss  He is a retired Production designer, theatre/television/film for [employer], [employer] examination 19/30 today  He was seen by ophthalmologist Steve Thompson, ordered laboratory evaluation which demonstrated positive acetylcholine receptor binding antibody 7.27  I also personally reviewed MRI of the brain and orbits with and without contrast, no acute intracranial abnormality, mild generalized atrophy small vessel disease,       PHYSICAL EXAM:   There were no vitals filed for this visit.  There is no height or weight on file to calculate  BMI.  PHYSICAL EXAMNIATION:  Gen: NAD, very pleasant elderly Caucasian male, conversant, well nourised, well groomed                     Cardiovascular: Regular rate rhythm, no peripheral edema, warm, nontender. Eyes: Conjunctivae clear without exudates or hemorrhage Neck: Supple, no carotid bruits. Pulmonary: Clear to auscultation bilaterally   NEUROLOGICAL EXAM:  MENTAL STATUS: Speech/cognition: Awake, alert, oriented to history taking and casual conversation     05/03/2022    9:53 AM 01/31/2022    3:00 PM  Montreal Cognitive Assessment    Visuospatial/ Executive (0/5) 3 3  Naming (0/3) 1 2  Attention: Read list of digits (0/2) 2 2  Attention: Read list of letters (0/1) 1 1  Attention: Serial 7 subtraction starting at 100 (0/3) 3 3  Language: Repeat phrase (0/2) 2 2  Language : Fluency (0/1) 0 0  Abstraction (0/2) 1 1  Delayed Recall (0/5) 0 0  Orientation (0/6) 6 5  Total 19 19    CRANIAL NERVES: CN II: Visual fields are full to confrontation. Pupils are round equal and briskly reactive to light. CN III, IV, VI: unable to appreciate eyelid ptosis, mild bilateral exophoria CN V: Facial sensation is intact to light touch CN VII: Face is symmetric with normal eye closure  CN VIII: Hearing is normal to causal conversation. CN IX, X: Phonation is normal. CN XI: Head turning and shoulder shrug are intact  MOTOR: Full strength in all tested extremities  REFLEXES: Reflexes are 2+ and symmetric at the biceps, triceps, knees, and ankles. Plantar responses are flexor.  SENSORY: Intact to light touch, pinprick and vibratory sensation are intact in fingers and toes.  COORDINATION: There is no trunk or limb dysmetria noted.  GAIT/STANCE: Posture is normal. Gait is steady with normal steps, base, arm swing, and turning. Heel and toe walking are normal. Tandem gait is normal.    REVIEW OF SYSTEMS:  Full 14 system review of systems performed and notable only for as above All other review of systems were negative.   ALLERGIES: No Known Allergies  HOME MEDICATIONS: Current Outpatient Medications  Medication Sig Dispense Refill   aspirin EC 81 MG tablet Take 1 tablet (81 mg total) by mouth daily. 20 tablet 0   clopidogrel (PLAVIX) 75 MG tablet Take 1 tablet (75 mg total) by mouth daily. 30 tablet 2   gabapentin (NEURONTIN) 300 MG capsule Take 300 mg by mouth 4 (four) times daily.  Takes 1 capsule in am  3 capsules at night ( around 7:30p)     levothyroxine (SYNTHROID) 100 MCG tablet Take 1 tablet (100 mcg total) by  mouth daily. 30 tablet 2   loratadine (CLARITIN) 10 MG tablet 1 tablet Orally Once a day     predniSONE (DELTASONE) 5 MG tablet Take 5 mg by mouth daily with breakfast.     pyridostigmine (MESTINON) 60 MG tablet Take 60 mg by mouth 3 (three) times daily. Patient not taking     rosuvastatin (CRESTOR) 20 MG tablet Take 1 tablet (20 mg total) by mouth daily. 30 tablet 2   tamsulosin (FLOMAX) 0.4 MG CAPS capsule Take 0.4 mg by mouth daily.     No current facility-administered medications for this visit.    PAST MEDICAL HISTORY: Past Medical History:  Diagnosis Date   Arthritis    Hemorrhoids    Hypercalcemia    Hyperthyroidism    Peripheral neuropathy     PAST SURGICAL HISTORY:  Past Surgical History:  Procedure Laterality Date   HEMORRHOID SURGERY     PARATHYROIDECTOMY  10/13/10    FAMILY HISTORY: Family History  Problem Relation Age of Onset   Cancer Sister        lung    SOCIAL HISTORY: Social History   Socioeconomic History   Marital status: Married    Spouse name: Not on file   Number of children: Not on file   Years of education: Not on file   Highest education level: Not on file  Occupational History   Not on file  Tobacco Use   Smoking status: Former   Smokeless tobacco: Never  Substance and Sexual Activity   Alcohol use: No   Drug use: No   Sexual activity: Not on file  Other Topics Concern   Not on file  Social History Narrative   Not on file   Social Drivers of Health   Financial Resource Strain: Not on file  Food Insecurity: No Food Insecurity (02/26/2023)   Hunger Vital Sign    Worried About Running Out of Food in the Last Year: Never true    Ran Out of Food in the Last Year: Never true  Transportation Needs: No Transportation Needs (02/26/2023)   PRAPARE - Administrator, Civil Service (Medical): No    Lack of Transportation (Non-Medical): No  Physical Activity: Not on file  Stress: Not on file  Social Connections: Moderately  Integrated (02/26/2023)   Social Connection and Isolation Panel [NHANES]    Frequency of Communication with Friends and Family: Once a week    Frequency of Social Gatherings with Friends and Family: Once a week    Attends Religious Services: More than 4 times per year    Active Member of [employer] or Organizations: Yes    Attends Banker Meetings: More than 4 times per year    Marital Status: Married  Catering manager Violence: Not At Risk (02/26/2023)   Humiliation, Afraid, Rape, and Kick questionnaire    Fear of Current or Ex-Partner: No    Emotionally Abused: No    Physically Abused: No    Sexually Abused: No      I spent 30 minutes of face-to-face and non-face-to-face time with patient and wife.  This included previsit chart review, lab review, study review, order entry, electronic health record documentation, patient and wife education and discussion regarding above diagnoses and treatment plan and answered all other questions to patient and wife's satisfaction  Ihor Austin, AGNP-BC  [facility] [address] [place], [place] [address]  Phone [phone] Fax [phone] Note: This document was prepared with digital dictation and possible smart phrase technology. Any transcriptional errors that result from this process are unintentional.

## 2023-03-06 ENCOUNTER — Encounter: Payer: Self-pay | Admitting: Adult Health

## 2023-03-06 ENCOUNTER — Ambulatory Visit: Payer: PPO | Admitting: Adult Health

## 2023-03-06 VITALS — BP 124/62 | HR 80 | Ht 69.0 in | Wt 184.0 lb

## 2023-03-06 DIAGNOSIS — R4189 Other symptoms and signs involving cognitive functions and awareness: Secondary | ICD-10-CM

## 2023-03-06 DIAGNOSIS — I6523 Occlusion and stenosis of bilateral carotid arteries: Secondary | ICD-10-CM | POA: Diagnosis not present

## 2023-03-06 DIAGNOSIS — I6381 Other cerebral infarction due to occlusion or stenosis of small artery: Secondary | ICD-10-CM | POA: Diagnosis not present

## 2023-03-06 DIAGNOSIS — G7000 Myasthenia gravis without (acute) exacerbation: Secondary | ICD-10-CM

## 2023-03-06 NOTE — Patient Instructions (Addendum)
 Continue Mestinon 30 mg 3 times daily as needed   Please call with any recurrent vision issues  Will plan on obtaining a carotid ultrasound in August for monitoring of your carotid stenosis  Continue working with home health therapy - once completed, if you are interested in additional outpatient therapies, please let me know  Continue aspirin 81 mg daily and clopidogrel 75 mg daily for additional 2 weeks then Plavix alone and continue Crestor for secondary stroke prevention - refills can be obtained by your PCP  Continue to follow up with PCP regarding blood pressure and cholesterol management  Maintain strict control of hypertension with blood pressure goal below 130/90 and cholesterol with LDL cholesterol (bad cholesterol) goal below 70 mg/dL.   Signs of a Stroke? Follow the BEFAST method:  Balance Watch for a sudden loss of balance, trouble with coordination or vertigo Eyes Is there a sudden loss of vision in one or both eyes? Or double vision?  Face: Ask the person to smile. Does one side of the face droop or is it numb?  Arms: Ask the person to raise both arms. Does one arm drift downward? Is there weakness or numbness of a leg? Speech: Ask the person to repeat a simple phrase. Does the speech sound slurred/strange? Is the person confused ? Time: If you observe any of these signs, call 911.       Followup in the future with me in 6 months or call earlier if needed      Thank you for coming to see Steve Thompson at [facility]. I hope we have been able to provide you high quality care today.  You may receive a patient satisfaction survey over the next few weeks. We would appreciate your feedback and comments so that we may continue to improve ourselves and the health of our patients.     Stroke Prevention Some medical conditions and lifestyle choices can lead to a higher risk for a stroke. You can help to prevent a stroke by eating healthy foods and exercising. It  also helps to not smoke and to manage any health problems you may have. How can this condition affect me? A stroke is an emergency. It should be treated right away. A stroke can lead to brain damage or threaten your life. There is a better chance of surviving and getting better after a stroke if you get medical help right away. What can increase my risk? The following medical conditions may increase your risk of a stroke: Diseases of the heart and blood vessels (cardiovascular disease). High blood pressure (hypertension). Diabetes. High cholesterol. Sickle cell disease. Problems with blood clotting. Being very overweight. Sleeping problems (obstructivesleep apnea). Other risk factors include: Being older than age 42. A history of blood clots, stroke, or mini-stroke (TIA). Race, ethnic background, or a family history of stroke. Smoking or using tobacco products. Taking birth control pills, especially if you smoke. Heavy alcohol and drug use. Not being active. What actions can I take to prevent this? Manage your health conditions High cholesterol. Eat a healthy diet. If this is not enough to manage your cholesterol, you may need to take medicines. Take medicines as told by your doctor. High blood pressure. Try to keep your blood pressure below 130/80. If your blood pressure cannot be managed through a healthy diet and regular exercise, you may need to take medicines. Take medicines as told by your doctor. Ask your doctor if you should check your blood pressure at home. Have  your blood pressure checked every year. Diabetes. Eat a healthy diet and get regular exercise. If your blood sugar (glucose) cannot be managed through diet and exercise, you may need to take medicines. Take medicines as told by your doctor. Talk to your doctor about getting checked for sleeping problems. Signs of a problem can include: Snoring a lot. Feeling very tired. Make sure that you manage any other  conditions you have. Nutrition  Follow instructions from your doctor about what to eat or drink. You may be told to: Eat and drink fewer calories each day. Limit how much salt (sodium) you use to 1,500 milligrams (mg) each day. Use only healthy fats for cooking, such as olive oil, canola oil, and sunflower oil. Eat healthy foods. To do this: Choose foods that are high in fiber. These include whole grains, and fresh fruits and vegetables. Eat at least 5 servings of fruits and vegetables a day. Try to fill one-half of your plate with fruits and vegetables at each meal. Choose low-fat (lean) proteins. These include low-fat cuts of meat, chicken without skin, fish, tofu, beans, and nuts. Eat low-fat dairy products. Avoid foods that: Are high in salt. Have saturated fat. Have trans fat. Have cholesterol. Are processed or pre-made. Count how many carbohydrates you eat and drink each day. Lifestyle If you drink alcohol: Limit how much you have to: 0-1 drink a day for women who are not pregnant. 0-2 drinks a day for men. Know how much alcohol is in your drink. In the [place] one drink equals one 12 oz bottle of beer ( ), one 5 oz glass of wine ( ), or one 1 oz glass of hard liquor (44mL). Do not smoke or use any products that have nicotine or tobacco. If you need help quitting, ask your doctor. Avoid secondhand smoke. Do not use drugs. Activity  Try to stay at a healthy weight. Get at least 30 minutes of exercise on most days, such as: Fast walking. Biking. Swimming. Medicines Take over-the-counter and prescription medicines only as told by your doctor. Avoid taking birth control pills. Talk to your doctor about the risks of taking birth control pills if: You are over 25 years old. You smoke. You get very bad headaches. You have had a blood clot. Where to find more information American Stroke Association: www.strokeassociation.org Get help right away if: You or a loved one  has any signs of a stroke. "BE FAST" is an easy way to remember the warning signs: B - Balance. Dizziness, sudden trouble walking, or loss of balance. E - Eyes. Trouble seeing or a change in how you see. F - Face. Sudden weakness or loss of feeling of the face. The face or eyelid may droop on one side. A - Arms. Weakness or loss of feeling in an arm. This happens all of a sudden and most often on one side of the body. S - Speech. Sudden trouble speaking, slurred speech, or trouble understanding what people say. T - Time. Time to call emergency services. Write down what time symptoms started. You or a loved one has other signs of a stroke, such as: A sudden, very bad headache with no known cause. Feeling like you may vomit (nausea). Vomiting. A seizure. These symptoms may be an emergency. Get help right away. Call your local emergency services (911 in the [place] Do not wait to see if the symptoms will go away. Do not drive yourself to the hospital. Summary You can help to prevent a stroke  by eating healthy, exercising, and not smoking. It also helps to manage any health problems you have. Do not smoke or use any products that contain nicotine or tobacco. Get help right away if you or a loved one has any signs of a stroke. This information is not intended to replace advice given to you by your health care provider. Make sure you discuss any questions you have with your health care provider. Document Revised: 12/05/2021 Document Reviewed: 12/05/2021 Elsevier Patient Education  2024 [employer].

## 2023-03-14 ENCOUNTER — Telehealth: Payer: Self-pay | Admitting: Adult Health

## 2023-03-14 NOTE — Telephone Encounter (Signed)
 Called the number back and the pt answered. The wife had stepped away. Advised based off last visit note with Shanda Bumps, pt had tapered off the prednisone back 10/2022. Advised she recommended that he remain on mestinon 60 mg TID. Pt verbalized understanding. Pt had no questions at this time but was encouraged to call back if questions arise.

## 2023-03-14 NOTE — Telephone Encounter (Signed)
 Wife is asking for a call to discuss the predniSONE (DELTASONE) 10 MG that Dr Terrace Arabia had pt on, she said it was 10 mg, then cut down to 5 and the last dose was cut down to 2.5 mg, please call to clarify if pt should still be taking this medication

## 2023-03-15 NOTE — Telephone Encounter (Signed)
 Wife called back, message from [place], [place] was relayed to her. Wife was thankful for response, no request for a call back.

## 2023-03-26 DIAGNOSIS — Z09 Encounter for follow-up examination after completed treatment for conditions other than malignant neoplasm: Secondary | ICD-10-CM | POA: Diagnosis not present

## 2023-03-26 DIAGNOSIS — Z6826 Body mass index (BMI) 26.0-26.9, adult: Secondary | ICD-10-CM | POA: Diagnosis not present

## 2023-03-26 DIAGNOSIS — D72829 Elevated white blood cell count, unspecified: Secondary | ICD-10-CM | POA: Diagnosis not present

## 2023-03-26 DIAGNOSIS — M7020 Olecranon bursitis, unspecified elbow: Secondary | ICD-10-CM | POA: Diagnosis not present

## 2023-04-03 ENCOUNTER — Other Ambulatory Visit (HOSPITAL_COMMUNITY): Payer: Self-pay

## 2023-04-17 DIAGNOSIS — M7022 Olecranon bursitis, left elbow: Secondary | ICD-10-CM | POA: Diagnosis not present

## 2023-04-17 DIAGNOSIS — Z6826 Body mass index (BMI) 26.0-26.9, adult: Secondary | ICD-10-CM | POA: Diagnosis not present

## 2023-04-25 DIAGNOSIS — Z85828 Personal history of other malignant neoplasm of skin: Secondary | ICD-10-CM | POA: Diagnosis not present

## 2023-04-25 DIAGNOSIS — L570 Actinic keratosis: Secondary | ICD-10-CM | POA: Diagnosis not present

## 2023-04-25 DIAGNOSIS — D485 Neoplasm of uncertain behavior of skin: Secondary | ICD-10-CM | POA: Diagnosis not present

## 2023-04-25 DIAGNOSIS — L814 Other melanin hyperpigmentation: Secondary | ICD-10-CM | POA: Diagnosis not present

## 2023-04-25 DIAGNOSIS — D692 Other nonthrombocytopenic purpura: Secondary | ICD-10-CM | POA: Diagnosis not present

## 2023-04-25 DIAGNOSIS — D1801 Hemangioma of skin and subcutaneous tissue: Secondary | ICD-10-CM | POA: Diagnosis not present

## 2023-04-25 DIAGNOSIS — L821 Other seborrheic keratosis: Secondary | ICD-10-CM | POA: Diagnosis not present

## 2023-05-01 DIAGNOSIS — G609 Hereditary and idiopathic neuropathy, unspecified: Secondary | ICD-10-CM | POA: Diagnosis not present

## 2023-05-01 DIAGNOSIS — Z6826 Body mass index (BMI) 26.0-26.9, adult: Secondary | ICD-10-CM | POA: Diagnosis not present

## 2023-05-01 DIAGNOSIS — R410 Disorientation, unspecified: Secondary | ICD-10-CM | POA: Diagnosis not present

## 2023-05-01 DIAGNOSIS — R2689 Other abnormalities of gait and mobility: Secondary | ICD-10-CM | POA: Diagnosis not present

## 2023-05-08 ENCOUNTER — Other Ambulatory Visit: Payer: Self-pay

## 2023-05-08 ENCOUNTER — Encounter: Payer: Self-pay | Admitting: Physical Therapy

## 2023-05-08 ENCOUNTER — Ambulatory Visit: Attending: Family Medicine | Admitting: Physical Therapy

## 2023-05-08 DIAGNOSIS — R2681 Unsteadiness on feet: Secondary | ICD-10-CM | POA: Insufficient documentation

## 2023-05-08 DIAGNOSIS — R2689 Other abnormalities of gait and mobility: Secondary | ICD-10-CM | POA: Diagnosis not present

## 2023-05-08 DIAGNOSIS — M6281 Muscle weakness (generalized): Secondary | ICD-10-CM | POA: Diagnosis not present

## 2023-05-08 NOTE — Therapy (Signed)
 OUTPATIENT PHYSICAL THERAPY LOWER EXTREMITY / BALANCE EVALUATION   Patient Name: Steve Thompson MRN: [id number] DOB:1931/02/12, 89 y.o., male Today's Date: 05/08/2023  END OF SESSION:  PT End of Session - 05/08/23 1707     Visit Number 1    Date for PT Re-Evaluation 07/03/23    Authorization Type HTA    PT Start Time 1445    PT Stop Time 1520    PT Time Calculation (min) 35 min    Activity Tolerance Patient tolerated treatment well    Behavior During Therapy [facility] for tasks assessed/performed             Past Medical History:  Diagnosis Date   Arthritis    Hemorrhoids    Hypercalcemia    Hyperthyroidism    Peripheral neuropathy    Past Surgical History:  Procedure Laterality Date   HEMORRHOID SURGERY     PARATHYROIDECTOMY  10/13/10   Patient Active Problem List   Diagnosis Date Noted   TIA (transient ischemic attack) 02/25/2023   Diplopia 01/31/2022   Paresthesia 01/31/2022   Cognitive impairment 01/31/2022   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 Stroke affected left side of body. He was in the hospital for a week. He did 8 weeks of home health after the stroke and he verbalized he feels the same. He did: squats, hip abduction, and single leg balance, and walking in his drive way. He feels the most unbalance later in the evening. He has to tell himself to be careful and slow down his movement.  He has the most challenge getting up from a chair.   PERTINENT HISTORY: TIA 02/25/2023; OA; peripheral neuropathy; ocular myasthenia gravis;  PAIN:  Are you having pain?  He has occasional back pain along his lumbar spine. But once he sits down his pain goes away.  PRECAUTIONS: None  RED FLAGS: None   WEIGHT BEARING RESTRICTIONS: No  FALLS:  Has patient fallen in last 6 months? Yes. Number of falls 4  LIVING ENVIRONMENT: Lives with: lives with their family Lives in: House/apartment Stairs:  two steps to go into the front door Has following equipment at home: Single point cane, Environmental consultant - 2 wheeled, shower chair, and Grab bars  OCCUPATION: Retired  PLOF: Independent, Independent with basic ADLs, Independent with gait, Independent with transfers, and Leisure: Watch TV; play canasta   PATIENT GOALS: Make getting out of a chair easier  NEXT MD VISIT: PRN  OBJECTIVE:  Note: Objective measures were completed at Evaluation unless otherwise noted.    PATIENT SURVEYS:  ABC scale 75%   COGNITION: Overall cognitive status: Within functional limits for tasks assessed     SENSATION: WFL    MUSCLE LENGTH: Hamstrings: decreased bilateral   POSTURE: rounded shoulders, forward head, and increased thoracic kyphosis   LOWER EXTREMITY ROM: WFL  LOWER EXTREMITY MMT:  MMT Right eval Left eval  Hip flexion 4 4-  Hip extension    Hip abduction 4 4  Hip adduction 4 4  Hip internal rotation    Hip external rotation    Knee flexion 4 4  Knee extension 4+ 4  Ankle dorsiflexion    Ankle plantarflexion    Ankle inversion    Ankle eversion     (Blank rows = not tested)   FUNCTIONAL TESTS:  5 times sit to stand: 21.01 sec no UE support Timed up and go (TUG): 13.76 sec patient ran into PT's computer that was not in walking path  Patient unable to stand on one leg without UE support GAIT:  Level of assistance: Complete Independence Comments: Occasional sway in gait  pattern; decreased cadence                                                                                                                                TREATMENT DATE: 05/08/2023 Initial Evaluation & HEP created     PATIENT EDUCATION:  Education details: POC; evaluation findings; sit to stand safety Person educated: Patient Education method: Explanation, Demonstration, and Handouts Education comprehension: verbalized understanding, returned demonstration, and needs further education  HOME EXERCISE PROGRAM: Access Code: [email or web address] URL: [email or web address] Date: 05/08/2023 Prepared by: Penelope Bowie  Exercises - Supine Bridge  - 1 x daily - 7 x weekly - 2 sets - 10 reps - Sit to Stand Without Arm Support  - 1 x daily - 7 x weekly - 2 sets - 10 reps - Seated Long Arc Quad  - 1 x daily - 7 x weekly - 2 sets - 10 reps - 2 hold - Seated Hamstring Stretch  - 1 x daily - 7 x weekly - 2 sets - 20-30 hold  ASSESSMENT:  CLINICAL IMPRESSION: Patient is a 89 y.o. male who was seen today for physical therapy evaluation and treatment for balance impairments. Steve Thompson presents today with some left sided weakness after a TIA on 02/24/2023. He did 8 weeks of home health physical therapy and he verbalized feeling like his strength and balance are still the same. Educated patient on the benefit of coming to an outpatient clinic and how we can manipulate environment to make exercises harder and challenge balance. When performing TUG patient ran into PT's cart which was not place into the patient's direct path. Patient verbalized that he feels the most unsteady when he stands up from a chair, and sometimes he requires multiple attempts to successfully complete stand. Based on evaluation noted muscle weakness, poor posture, and decreased single leg balance. Patient will benefit from skilled PT to address the below impairments and improve overall function.   OBJECTIVE IMPAIRMENTS: decreased balance,  decreased ROM, decreased strength, dizziness, hypomobility, increased muscle spasms, impaired flexibility, improper body mechanics, postural dysfunction, prosthetic dependency , and pain.   ACTIVITY LIMITATIONS: lifting, standing, squatting, stairs, and transfers  PARTICIPATION LIMITATIONS: cleaning, shopping, and community activity  PERSONAL FACTORS: Age and 1-2 comorbidities: OA; hx TIA  are also affecting patient's functional outcome.   REHAB POTENTIAL: Good  CLINICAL  DECISION MAKING: Stable/uncomplicated  EVALUATION COMPLEXITY: Low   GOALS: Goals reviewed with patient? Yes  SHORT TERM GOALS: Target date: 06/05/2023  Patient will be independent with initial HEP. Baseline:  Goal status: INITIAL  2.  Patient will report > or = to 30% improvement in balance since starting PT. Baseline:  Goal status: INITIAL     LONG TERM GOALS: Target date: 07/03/2023  Patient will demonstrate independence in advanced HEP. Baseline:  Goal status: INITIAL  2.  Patient will report > or = to 60% improvement in balance since starting PT. Baseline:  Goal status: INITIAL  3.  Patient will demonstrate safe transfer strategies and maintain standing balance after sit-to-stand. Baseline:  Goal status: INITIAL  4.  Patient will perform TUG in < or = to 11 sec to decrease falls risk. Baseline: 13.76 sec Goal status: INITIAL  5.  Patient will perform 5STS in < or = to 18 sec to maintain independence and functional mobility. Baseline:  Goal status: INITIAL  6.  Patient will report confidence >= 8/10 on the Activities-specific Balance Confidence (ABC) Scale for walking in the home, outdoors, and in crowded environments Baseline:  Goal status: INITIAL   PLAN:  PT FREQUENCY: 1-2x/week  PT DURATION: 8 weeks  PLANNED INTERVENTIONS: 97164- PT Re-evaluation, 97110-Therapeutic exercises, 97530- Therapeutic activity, 97112- Neuromuscular re-education, 97535- Self Care, 16109- Manual therapy,  [id number]- Gait training, [phone]- Canalith repositioning, [id number]- Aquatic Therapy, [id number]- Electrical stimulation (unattended), [id number]- Electrical stimulation (manual), 97016- Vasopneumatic device, [id number]- Ultrasound, [id number]- Ionotophoresis 4mg /ml Dexamethasone, Patient/Family education, Balance training, Stair training, Taping, Dry Needling, Joint mobilization, Joint manipulation, Spinal manipulation, Spinal mobilization, Vestibular training, Cryotherapy, and Moist heat  PLAN FOR NEXT SESSION: Review HEP; NuStep/ Recumbent bike; DGI; leg press; squats with weight; airex balance   Penelope Bowie, PT 05/08/23 5:08 PM [facility] [address], [address] [place], [place] [address] Phone # [phone] Fax [phone]

## 2023-05-17 NOTE — Therapy (Signed)
 OUTPATIENT PHYSICAL THERAPY LOWER EXTREMITY / BALANCE TREATMENT   Patient Name: Steve Thompson MRN: [id number] DOB:July 08, 1931, 89 y.o., male Today's Date: 05/18/2023  END OF SESSION:  PT End of Session - 05/18/23 1143     Visit Number 2    Date for PT Re-Evaluation 07/03/23    Authorization Type HTA    Progress Note Due on Visit 10    PT Start Time 1103    PT Stop Time 1143    PT Time Calculation (min) 40 min    Activity Tolerance Patient tolerated treatment well    Behavior During Therapy [facility] for tasks assessed/performed              Past Medical History:  Diagnosis Date   Arthritis    Hemorrhoids    Hypercalcemia    Hyperthyroidism    Peripheral neuropathy    Past Surgical History:  Procedure Laterality Date   HEMORRHOID SURGERY     PARATHYROIDECTOMY  10/13/10   Patient Active Problem List   Diagnosis Date Noted   TIA (transient ischemic attack) 02/25/2023   Diplopia 01/31/2022   Paresthesia 01/31/2022   Cognitive impairment 01/31/2022   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   From eval: Patient presents to therapy with balance deficits after a TIA on 02/25/2023. Stroke affected left side of body. He was in the hospital for a week. He did 8 weeks of home health after the stroke and he verbalized he feels the same. He did: squats, hip abduction, and single leg balance, and walking in his drive way. He feels the  most unbalance later in the evening. He has to tell himself to be careful and slow down his movement. He has the most challenge getting up from a chair.   PERTINENT HISTORY: TIA 02/25/2023; OA; peripheral neuropathy; ocular myasthenia gravis;  PAIN:  Are you having pain?  He has occasional back pain along his lumbar spine. But once he sits down his pain goes away.  PRECAUTIONS: None  RED FLAGS: None   WEIGHT BEARING RESTRICTIONS: No  FALLS:  Has patient fallen in last 6 months? Yes. Number of falls 4  LIVING ENVIRONMENT: Lives with: lives with their family Lives in: House/apartment Stairs:  two steps to go into the front door Has following equipment at home: Single point cane, Environmental consultant - 2 wheeled, shower chair, and Grab bars  OCCUPATION: Retired  PLOF: Independent, Independent with basic ADLs, Independent with gait, Independent with transfers, and Leisure: Watch TV; play canasta   PATIENT GOALS: Make getting out of a chair easier  NEXT MD VISIT: PRN  OBJECTIVE:  Note: Objective measures were completed at Evaluation unless otherwise noted.    PATIENT SURVEYS:  ABC scale 75%   COGNITION: Overall cognitive status: Within functional limits for tasks assessed     SENSATION: WFL    MUSCLE LENGTH: Hamstrings: decreased bilateral   POSTURE: rounded shoulders, forward head, and increased thoracic kyphosis   LOWER EXTREMITY ROM: WFL  LOWER EXTREMITY MMT:  MMT Right  eval Left eval  Hip flexion 4 4-  Hip extension    Hip abduction 4 4  Hip adduction 4 4  Hip internal rotation    Hip external rotation    Knee flexion 4 4  Knee extension 4+ 4  Ankle dorsiflexion    Ankle plantarflexion    Ankle inversion    Ankle eversion     (Blank rows = not tested)   FUNCTIONAL TESTS:  5 times sit to stand: 21.01 sec no UE support Timed up and go (TUG): 13.76 sec patient ran into PT's computer that was not in walking path  Patient unable to stand on one leg without UE  support  05/18/23 DGI 22/24 GAIT:  Level of assistance: Complete Independence Comments: Occasional sway in gait pattern; decreased cadence                                                                                                                                TREATMENT DATE:  05/18/23 NuStep L5 x 3 min DGI 22/24 Sit to stand x 10 then feels in legs from knees down Seated LAQ, march, ER  3# 2x10 Standing 3# AW march, heel raises, hip ABD x10 , hip ext x 10, HS curl Seated HS stretch 2x30 sec B Supine bridge 2 x 12 S/L clam green band 2x10 Airex balance: EO, EC - some drifting bwd; EO with head turns - some drifting bwd with head turns L  05/08/2023 Initial Evaluation & HEP created     PATIENT EDUCATION:  Education details: POC; evaluation findings; sit to stand safety Person educated: Patient Education method: Explanation, Demonstration, and Handouts Education comprehension: verbalized understanding, returned demonstration, and needs further education  HOME EXERCISE PROGRAM: Access Code: [email or web address] URL: [email or web address] Date: 05/08/2023 Prepared by: Penelope Bowie  Exercises - Supine Bridge  - 1 x daily - 7 x weekly - 2 sets - 10 reps - Sit to Stand Without Arm Support  - 1 x daily - 7 x weekly - 2 sets - 10 reps - Seated Long Arc Quad  - 1 x daily - 7 x weekly - 2 sets - 10 reps - 2 hold - Seated Hamstring Stretch  - 1 x daily - 7 x weekly - 2 sets - 20-30 hold  ASSESSMENT:  CLINICAL IMPRESSION: Patient scored 22/24 on the DGI. He had mild deviation in balance with transition from looking up to looking down. On compliant surface he had most difficulty with head turns left and also with EC. He did well with strengthening and may be able to progress in weight next visit. He reports compliance with HEP.   Eval: Patient is a 89 y.o. male who was seen today for physical therapy evaluation and treatment for balance impairments. Steve Thompson presents today with some  left sided weakness after a TIA on 02/24/2023. He did 8 weeks of home health physical therapy and he verbalized feeling like his strength and balance  are still the same. Educated patient on the benefit of coming to an outpatient clinic and how we can manipulate environment to make exercises harder and challenge balance. When performing TUG patient ran into PT's cart which was not place into the patient's direct path. Patient verbalized that he feels the most unsteady when he stands up from a chair, and sometimes he requires multiple attempts to successfully complete stand. Based on evaluation noted muscle weakness, poor posture, and decreased single leg balance. Patient will benefit from skilled PT to address the below impairments and improve overall function.   OBJECTIVE IMPAIRMENTS: decreased balance, decreased ROM, decreased strength, dizziness, hypomobility, increased muscle spasms, impaired flexibility, improper body mechanics, postural dysfunction, prosthetic dependency , and pain.   ACTIVITY LIMITATIONS: lifting, standing, squatting, stairs, and transfers  PARTICIPATION LIMITATIONS: cleaning, shopping, and community activity  PERSONAL FACTORS: Age and 1-2 comorbidities: OA; hx TIA  are also affecting patient's functional outcome.   REHAB POTENTIAL: Good  CLINICAL DECISION MAKING: Stable/uncomplicated  EVALUATION COMPLEXITY: Low   GOALS: Goals reviewed with patient? Yes  SHORT TERM GOALS: Target date: 06/05/2023  Patient will be independent with initial HEP. Baseline:  Goal status: INITIAL  2.  Patient will report > or = to 30% improvement in balance since starting PT. Baseline:  Goal status: INITIAL     LONG TERM GOALS: Target date: 07/03/2023  Patient will demonstrate independence in advanced HEP. Baseline:  Goal status: INITIAL  2.  Patient will report > or = to 60% improvement in balance since starting PT. Baseline:  Goal status: INITIAL  3.  Patient will demonstrate  safe transfer strategies and maintain standing balance after sit-to-stand. Baseline:  Goal status: INITIAL  4.  Patient will perform TUG in < or = to 11 sec to decrease falls risk. Baseline: 13.76 sec Goal status: INITIAL  5.  Patient will perform 5STS in < or = to 18 sec to maintain independence and functional mobility. Baseline:  Goal status: INITIAL  6.  Patient will report confidence >= 8/10 on the Activities-specific Balance Confidence (ABC) Scale for walking in the home, outdoors, and in crowded environments Baseline:  Goal status: INITIAL   PLAN:  PT FREQUENCY: 1-2x/week  PT DURATION: 8 weeks  PLANNED INTERVENTIONS: 97164- PT Re-evaluation, 97110-Therapeutic exercises, 97530- Therapeutic activity, 97112- Neuromuscular re-education, 97535- Self Care, 40102- Manual therapy, [id number]- Gait training, [phone]- Canalith repositioning, [id number]- Aquatic Therapy, [id number]- Electrical stimulation (unattended), [id number]- Electrical stimulation (manual), 97016- Vasopneumatic device, [id number]- Ultrasound, [id number]- Ionotophoresis 4mg /ml Dexamethasone, Patient/Family education, Balance training, Stair training, Taping, Dry Needling, Joint mobilization, Joint manipulation, Spinal manipulation, Spinal mobilization, Vestibular training, Cryotherapy, and Moist heat  PLAN FOR NEXT SESSION:  leg press; squats with weight; airex balance, hurdles   Jinx Mourning, PT  05/18/23 11:50 AM [facility] [address] [place], [place] [address] Phone # [phone] Fax [phone]

## 2023-05-18 ENCOUNTER — Ambulatory Visit: Attending: Family Medicine | Admitting: Physical Therapy

## 2023-05-18 ENCOUNTER — Encounter: Payer: Self-pay | Admitting: Physical Therapy

## 2023-05-18 DIAGNOSIS — R2681 Unsteadiness on feet: Secondary | ICD-10-CM | POA: Diagnosis not present

## 2023-05-18 DIAGNOSIS — R2689 Other abnormalities of gait and mobility: Secondary | ICD-10-CM | POA: Diagnosis not present

## 2023-05-18 DIAGNOSIS — M6281 Muscle weakness (generalized): Secondary | ICD-10-CM | POA: Diagnosis not present

## 2023-05-25 ENCOUNTER — Ambulatory Visit: Admitting: Physical Therapy

## 2023-06-01 ENCOUNTER — Ambulatory Visit: Admitting: Physical Therapy

## 2023-06-08 ENCOUNTER — Encounter: Payer: Self-pay | Admitting: Physical Therapy

## 2023-06-08 ENCOUNTER — Ambulatory Visit: Admitting: Physical Therapy

## 2023-06-08 DIAGNOSIS — M6281 Muscle weakness (generalized): Secondary | ICD-10-CM

## 2023-06-08 DIAGNOSIS — R2681 Unsteadiness on feet: Secondary | ICD-10-CM

## 2023-06-08 DIAGNOSIS — R2689 Other abnormalities of gait and mobility: Secondary | ICD-10-CM

## 2023-06-08 NOTE — Therapy (Signed)
 OUTPATIENT PHYSICAL THERAPY LOWER EXTREMITY / BALANCE TREATMENT   Patient Name: Steve Thompson MRN: [id number] DOB:1931/06/07, 89 y.o., male Today's Date: 06/08/2023  END OF SESSION:  PT End of Session - 06/08/23 1107     Visit Number 3    Date for PT Re-Evaluation 07/03/23    Authorization Type HTA    Progress Note Due on Visit 10    PT Start Time 1103    PT Stop Time 1145    PT Time Calculation (min) 42 min    Activity Tolerance Patient tolerated treatment well    Behavior During Therapy [facility] for tasks assessed/performed               Past Medical History:  Diagnosis Date   Arthritis    Hemorrhoids    Hypercalcemia    Hyperthyroidism    Peripheral neuropathy    Past Surgical History:  Procedure Laterality Date   HEMORRHOID SURGERY     PARATHYROIDECTOMY  10/13/10   Patient Active Problem List   Diagnosis Date Noted   TIA (transient ischemic attack) 02/25/2023   Diplopia 01/31/2022   Paresthesia 01/31/2022   Cognitive impairment 01/31/2022   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 He reports he does not do his exercises daily but at least 2x/wk. He does a lot of walking. He walks close to a mile every day.    From eval: Patient presents to therapy with balance deficits after a TIA on 02/25/2023. Stroke affected left side of body. He was in the hospital for a week. He did 8 weeks of home health after the stroke and he  verbalized he feels the same. He did: squats, hip abduction, and single leg balance, and walking in his drive way. He feels the most unbalance later in the evening. He has to tell himself to be careful and slow down his movement. He has the most challenge getting up from a chair.   PERTINENT HISTORY: TIA 02/25/2023; OA; peripheral neuropathy; ocular myasthenia gravis;  PAIN:  Are you having pain? He has occasional back pain along his lumbar spine. But once he sits down his pain goes away.  PRECAUTIONS: None  RED FLAGS: None   WEIGHT BEARING RESTRICTIONS: No  FALLS:  Has patient fallen in last 6 months? Yes. Number of falls 4  LIVING ENVIRONMENT: Lives with: lives with their family Lives in: House/apartment Stairs: two steps to go into the front door Has following equipment at home: Single point cane, Environmental consultant - 2 wheeled, shower chair, and Grab bars  OCCUPATION: Retired  PLOF: Independent, Independent with basic ADLs, Independent with gait, Independent with transfers, and Leisure: Watch TV; play canasta   PATIENT GOALS: Make getting out of a chair easier  NEXT MD VISIT: PRN  OBJECTIVE:  Note: Objective measures were completed at Evaluation unless otherwise noted.    PATIENT SURVEYS:  ABC scale 75%   COGNITION: Overall cognitive status: Within functional limits for tasks assessed     SENSATION: WFL    MUSCLE LENGTH: Hamstrings: decreased  bilateral   POSTURE: rounded shoulders, forward head, and increased thoracic kyphosis   LOWER EXTREMITY ROM: WFL  LOWER EXTREMITY MMT:  MMT Right eval Left eval  Hip flexion 4 4-  Hip extension    Hip abduction 4 4  Hip adduction 4 4  Hip internal rotation    Hip external rotation    Knee flexion 4 4  Knee extension 4+ 4  Ankle dorsiflexion    Ankle plantarflexion    Ankle inversion    Ankle eversion     (Blank rows = not tested)   FUNCTIONAL TESTS:  5 times sit to stand: 21.01 sec no UE support Timed up and go  (TUG): 13.76 sec patient ran into PT's computer that was not in walking path  Patient unable to stand on one leg without UE support  05/18/23 DGI 22/24 GAIT:  Level of assistance: Complete Independence Comments: Occasional sway in gait pattern; decreased cadence                                                                                                                                TREATMENT DATE:  06/08/23 NuStep L5 x 5 min Leg press 75# 2x10 Heel raises x 10 Toe/heel walking at barre Rockerboard for gastroc stretch Hurdle lateral up and overs x 10 B Standing hip ABD red loop 2x10 Standing hip Ext red loop 1x10 - then needed to sit due to legs hurting Seated LAQ 2x10, march 1x 10  5#AW Sit to stand x 10  Seated HS stretch 2x30 sec B Airex balance: step ups fwd 2x5 B, lateral up and overs x 5 B - close supervision   05/18/23 NuStep L5 x 3 min DGI 22/24 Sit to stand x 10 then feels in legs from knees down Seated LAQ, march, ER  3# 2x10 Standing 3# AW march, heel raises, hip ABD x10 , hip ext x 10, HS curl Seated HS stretch 2x30 sec B Supine bridge 2 x 12 S/L clam green band 2x10 Airex balance: EO, EC - some drifting bwd; EO with head turns - some drifting bwd with head turns L  05/08/2023 Initial Evaluation & HEP created     PATIENT EDUCATION:  Education details: POC; evaluation findings; sit to stand safety Person educated: Patient Education method: Explanation, Demonstration, and Handouts Education comprehension: verbalized understanding, returned demonstration, and needs further education  HOME EXERCISE PROGRAM: Access Code: [email or web address] URL: [email or web address] Date: 05/08/2023 Prepared by: Penelope Bowie  Exercises - Supine Bridge  - 1 x daily - 7 x weekly - 2 sets - 10 reps - Sit to Stand Without Arm Support  - 1 x daily - 7 x weekly - 2 sets - 10 reps - Seated Long Arc Quad  - 1 x daily - 7 x weekly - 2 sets - 10 reps - 2 hold - Seated Hamstring  Stretch  - 1 x daily - 7 x weekly - 2 sets -  20-30 hold  ASSESSMENT:  CLINICAL IMPRESSION:  Patient did well today. He was challenged with increased resistance to seated exercises. He demonstrates LOB backward with stepping down from foam and also with lateral step overs. He experiences some increased leg pain after about 20 min of standing exercises. He continues to demonstrate potential for improvement and would benefit from continued skilled therapy to address impairments.    Eval: Patient is a 89 y.o. male who was seen today for physical therapy evaluation and treatment for balance impairments. Reign presents today with some left sided weakness after a TIA on 02/24/2023. He did 8 weeks of home health physical therapy and he verbalized feeling like his strength and balance are still the same. Educated patient on the benefit of coming to an outpatient clinic and how we can manipulate environment to make exercises harder and challenge balance. When performing TUG patient ran into PT's cart which was not place into the patient's direct path. Patient verbalized that he feels the most unsteady when he stands up from a chair, and sometimes he requires multiple attempts to successfully complete stand. Based on evaluation noted muscle weakness, poor posture, and decreased single leg balance. Patient will benefit from skilled PT to address the below impairments and improve overall function.   OBJECTIVE IMPAIRMENTS: decreased balance, decreased ROM, decreased strength, dizziness, hypomobility, increased muscle spasms, impaired flexibility, improper body mechanics, postural dysfunction, prosthetic dependency , and pain.   ACTIVITY LIMITATIONS: lifting, standing, squatting, stairs, and transfers  PARTICIPATION LIMITATIONS: cleaning, shopping, and community activity  PERSONAL FACTORS: Age and 1-2 comorbidities: OA; hx TIA are also affecting patient's functional outcome.   REHAB POTENTIAL: Good  CLINICAL  DECISION MAKING: Stable/uncomplicated  EVALUATION COMPLEXITY: Low   GOALS: Goals reviewed with patient? Yes  SHORT TERM GOALS: Target date: 06/05/2023  Patient will be independent with initial HEP. Baseline:  Goal status: IN PROGRESS 06/08/23  2.  Patient will report > or = to 30% improvement in balance since starting PT. Baseline:  Goal status: IN PROGRESS     LONG TERM GOALS: Target date: 07/03/2023  Patient will demonstrate independence in advanced HEP. Baseline:  Goal status: INITIAL  2.  Patient will report > or = to 60% improvement in balance since starting PT. Baseline:  Goal status: INITIAL  3.  Patient will demonstrate safe transfer strategies and maintain standing balance after sit-to-stand. Baseline:  Goal status: INITIAL  4.  Patient will perform TUG in < or = to 11 sec to decrease falls risk. Baseline: 13.76 sec Goal status: INITIAL  5.  Patient will perform 5STS in < or = to 18 sec to maintain independence and functional mobility. Baseline:  Goal status: INITIAL  6.  Patient will report confidence >= 8/10 on the Activities-specific Balance Confidence (ABC) Scale for walking in the home, outdoors, and in crowded environments Baseline:  Goal status: INITIAL   PLAN:  PT FREQUENCY: 1-2x/week  PT DURATION: 8 weeks  PLANNED INTERVENTIONS: 97164- PT Re-evaluation, 97110-Therapeutic exercises, 97530- Therapeutic activity, 97112- Neuromuscular re-education, 97535- Self Care, 82956- Manual therapy, [id number]- Gait training, [phone]- Canalith repositioning, [id number]- Aquatic Therapy, G0283- Electrical stimulation (unattended), [id number]- Electrical stimulation (manual), 97016- Vasopneumatic device, [id number]- Ultrasound, [id number]- Ionotophoresis 4mg /ml Dexamethasone, Patient/Family education, Balance training, Stair training, Taping, Dry Needling, Joint mobilization, Joint manipulation, Spinal manipulation, Spinal mobilization, Vestibular training, Cryotherapy, and Moist  heat  PLAN FOR NEXT SESSION:  leg press; squats with weight; airex balance, hurdles   Jinx Mourning, PT  06/08/23 11:50 AM [facility] [address] [place], [place] [address] Phone # [phone] Fax [phone]

## 2023-06-14 NOTE — Therapy (Signed)
 OUTPATIENT PHYSICAL THERAPY LOWER EXTREMITY / BALANCE TREATMENT   Patient Name: Steve Thompson MRN: [id number] DOB:December 14, 1931, 89 y.o., male Today's Date: 06/15/2023  END OF SESSION:  PT End of Session - 06/15/23 1141     Visit Number 4    Date for PT Re-Evaluation 07/03/23    Authorization Type HTA    Progress Note Due on Visit 10    PT Start Time 1102    PT Stop Time 1141    PT Time Calculation (min) 39 min    Activity Tolerance Patient tolerated treatment well    Behavior During Therapy [facility] for tasks assessed/performed                Past Medical History:  Diagnosis Date   Arthritis    Hemorrhoids    Hypercalcemia    Hyperthyroidism    Peripheral neuropathy    Past Surgical History:  Procedure Laterality Date   HEMORRHOID SURGERY     PARATHYROIDECTOMY  10/13/10   Patient Active Problem List   Diagnosis Date Noted   TIA (transient ischemic attack) 02/25/2023   Diplopia 01/31/2022   Paresthesia 01/31/2022   Cognitive impairment 01/31/2022   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 Pt reports no falls.    From eval: Patient presents to therapy with balance deficits after a TIA on 02/25/2023. Stroke affected left side of body. He was in the hospital for a week. He did 8 weeks of home health after the stroke and he verbalized he feels the same. He did: squats, hip abduction, and single leg balance, and walking in his drive way. He feels the  most unbalance later in the evening. He has to tell himself to be careful and slow down his movement. He has the most challenge getting up from a chair.   PERTINENT HISTORY: TIA 02/25/2023; OA; peripheral neuropathy; ocular myasthenia gravis;  PAIN:  Are you having pain? He has occasional back pain along his lumbar spine. But once he sits down his pain goes away.  PRECAUTIONS: None  RED FLAGS: None   WEIGHT BEARING RESTRICTIONS: No  FALLS:  Has patient fallen in last 6 months? Yes. Number of falls 4  LIVING ENVIRONMENT: Lives with: lives with their family Lives in: House/apartment Stairs: two steps to go into the front door Has following equipment at home: Single point cane, Environmental consultant - 2 wheeled, shower chair, and Grab bars  OCCUPATION: Retired  PLOF: Independent, Independent with basic ADLs, Independent with gait, Independent with transfers, and Leisure: Watch TV; play canasta   PATIENT GOALS: Make getting out of a chair easier  NEXT MD VISIT: PRN  OBJECTIVE:  Note: Objective measures were completed at Evaluation unless otherwise noted.    PATIENT SURVEYS:  ABC scale 75%   COGNITION: Overall cognitive status: Within functional limits for tasks assessed     SENSATION: WFL    MUSCLE LENGTH: Hamstrings: decreased bilateral   POSTURE: rounded shoulders, forward head, and increased thoracic kyphosis   LOWER EXTREMITY ROM: WFL  LOWER EXTREMITY MMT:  MMT Right  eval Left eval  Hip flexion 4 4-  Hip extension    Hip abduction 4 4  Hip adduction 4 4  Hip internal rotation    Hip external rotation    Knee flexion 4 4  Knee extension 4+ 4  Ankle dorsiflexion    Ankle plantarflexion    Ankle inversion    Ankle eversion     (Blank rows = not tested)   FUNCTIONAL TESTS:  5 times sit to stand: 21.01 sec no UE support Timed up and go (TUG): 13.76 sec patient ran into PT's computer that was not in walking path  Patient unable to stand on one leg without UE  support  05/18/23 DGI 22/24 GAIT:  Level of assistance: Complete Independence Comments: Occasional sway in gait pattern; decreased cadence                                                                                                                                TREATMENT DATE:  06/15/23 NuStep L1 x 1 min then L 2 x 1 min - at end today (stopped due to fatigue) Leg press 75# 2x10 Heel raises x 10 Toe/heel walking at barre Rockerboard for gastroc stretch x 1 min Sit to stand x 10  Squats with 5# weights B x 10 to chair + foam Seated 4#AW LAQ 2x10, march 2x 10   - challenging Standing hip ABD 4#AW 1x10 some pain in back when doing L side Standing HS Curl 4# AW x 10 B - then short rest Hurdle lateral up and overs x 10 B 4#AW Hurdle step overs lateral and fwd - no UE support Airex balance: step ups fwd x 10 B Resisted walking 10# Bwd and sidestepping B x 5 ea  06/08/23 NuStep L5 x 5 min Leg press 75# 2x10 Heel raises x 10 Toe/heel walking at barre Rockerboard for gastroc stretch Hurdle lateral up and overs x 10 B Standing hip ABD red loop 2x10 Standing hip Ext red loop 1x10 - then needed to sit due to legs hurting Seated LAQ 2x10, march 1x 10  5#AW Sit to stand x 10  Seated HS stretch 2x30 sec B Airex balance: step ups fwd 2x5 B, lateral up and overs x 5 B - close supervision   05/18/23 NuStep L5 x 3 min DGI 22/24 Sit to stand x 10 then feels in legs from knees down Seated LAQ, march, ER  3# 2x10 Standing 3# AW march, heel raises, hip ABD x10 , hip ext x 10, HS curl Seated HS stretch 2x30 sec B Supine bridge 2 x 12 S/L clam green band 2x10 Airex balance: EO, EC - some drifting bwd; EO with head turns - some drifting bwd with head turns L  05/08/2023 Initial Evaluation & HEP created     PATIENT EDUCATION:  Education details: POC; evaluation findings; sit to stand safety Person educated: Patient Education method: Explanation, Demonstration, and Handouts Education  comprehension:  verbalized understanding, returned demonstration, and needs further education  HOME EXERCISE PROGRAM: Access Code: [email or web address] URL: [email or web address] Date: 05/08/2023 Prepared by: Penelope Bowie  Exercises - Supine Bridge  - 1 x daily - 7 x weekly - 2 sets - 10 reps - Sit to Stand Without Arm Support  - 1 x daily - 7 x weekly - 2 sets - 10 reps - Seated Long Arc Quad  - 1 x daily - 7 x weekly - 2 sets - 10 reps - 2 hold - Seated Hamstring Stretch  - 1 x daily - 7 x weekly - 2 sets - 20-30 hold  ASSESSMENT:  CLINICAL IMPRESSION: Luiz did better today with step ups on foam and with hurdles. Left LE continues to be weaker with these activities and he intermittently does not clear the foam when stepping back off of it. He also has intermittent back pain with standing and requires short rest breaks. He denies any falls. He continues to demonstrate potential for improvement and would benefit from continued skilled therapy to address impairments.      Eval: Patient is a 89 y.o. male who was seen today for physical therapy evaluation and treatment for balance impairments. Azarias presents today with some left sided weakness after a TIA on 02/24/2023. He did 8 weeks of home health physical therapy and he verbalized feeling like his strength and balance are still the same. Educated patient on the benefit of coming to an outpatient clinic and how we can manipulate environment to make exercises harder and challenge balance. When performing TUG patient ran into PT's cart which was not place into the patient's direct path. Patient verbalized that he feels the most unsteady when he stands up from a chair, and sometimes he requires multiple attempts to successfully complete stand. Based on evaluation noted muscle weakness, poor posture, and decreased single leg balance. Patient will benefit from skilled PT to address the below impairments and improve overall function.   OBJECTIVE  IMPAIRMENTS: decreased balance, decreased ROM, decreased strength, dizziness, hypomobility, increased muscle spasms, impaired flexibility, improper body mechanics, postural dysfunction, prosthetic dependency , and pain.   ACTIVITY LIMITATIONS: lifting, standing, squatting, stairs, and transfers  PARTICIPATION LIMITATIONS: cleaning, shopping, and community activity  PERSONAL FACTORS: Age and 1-2 comorbidities: OA; hx TIA are also affecting patient's functional outcome.   REHAB POTENTIAL: Good  CLINICAL DECISION MAKING: Stable/uncomplicated  EVALUATION COMPLEXITY: Low   GOALS: Goals reviewed with patient? Yes  SHORT TERM GOALS: Target date: 06/05/2023  Patient will be independent with initial HEP. Baseline:  Goal status: IN PROGRESS 06/08/23  2.  Patient will report > or = to 30% improvement in balance since starting PT. Baseline:  Goal status: IN PROGRESS     LONG TERM GOALS: Target date: 07/03/2023  Patient will demonstrate independence in advanced HEP. Baseline:  Goal status: INITIAL  2.  Patient will report > or = to 60% improvement in balance since starting PT. Baseline:  Goal status: INITIAL  3.  Patient will demonstrate safe transfer strategies and maintain standing balance after sit-to-stand. Baseline:  Goal status: INITIAL  4.  Patient will perform TUG in < or = to 11 sec to decrease falls risk. Baseline: 13.76 sec Goal status: INITIAL  5.  Patient will perform 5STS in < or = to 18 sec to maintain independence and functional mobility. Baseline:  Goal status: INITIAL  6.  Patient will report confidence >= 8/10 on the Activities-specific Balance Confidence (ABC) Scale for walking in the home, outdoors,  and in crowded environments Baseline:  Goal status: INITIAL   PLAN:  PT FREQUENCY: 1-2x/week  PT DURATION: 8 weeks  PLANNED INTERVENTIONS: 97164- PT Re-evaluation, 97110-Therapeutic exercises, 97530- Therapeutic activity, 97112- Neuromuscular  re-education, 97535- Self Care, 16109- Manual therapy, [phone]- Gait training, [phone]- Canalith repositioning, [id number]- Aquatic Therapy, [phone]- Electrical stimulation (unattended), [phone]- Electrical stimulation (manual), 97016- Vasopneumatic device, [id number]- Ultrasound, [id number]- Ionotophoresis 4mg /ml Dexamethasone, Patient/Family education, Balance training, Stair training, Taping, Dry Needling, Joint mobilization, Joint manipulation, Spinal manipulation, Spinal mobilization, Vestibular training, Cryotherapy, and Moist heat  PLAN FOR NEXT SESSION:  Test TUG and 5XSTS, continue resisted walking, leg press; squats with weight; airex balance, hurdles   Jinx Mourning, PT  06/15/23 11:49 AM [facility] [address] [place], [place] [address] Phone # [phone] Fax [phone]

## 2023-06-15 ENCOUNTER — Encounter: Payer: Self-pay | Admitting: Physical Therapy

## 2023-06-15 ENCOUNTER — Ambulatory Visit: Admitting: Physical Therapy

## 2023-06-15 DIAGNOSIS — R2689 Other abnormalities of gait and mobility: Secondary | ICD-10-CM

## 2023-06-15 DIAGNOSIS — M6281 Muscle weakness (generalized): Secondary | ICD-10-CM

## 2023-06-15 DIAGNOSIS — R2681 Unsteadiness on feet: Secondary | ICD-10-CM | POA: Diagnosis not present

## 2023-06-21 NOTE — Therapy (Signed)
 OUTPATIENT PHYSICAL THERAPY LOWER EXTREMITY / BALANCE TREATMENT   Patient Name: Steve Thompson MRN: [id number] DOB:[birth year], 89 y.o., male Today's Date: 06/22/2023  END OF SESSION:  PT End of Session - 06/22/23 1056     Visit Number 5    Date for PT Re-Evaluation 07/03/23    Authorization Type HTA    Progress Note Due on Visit 10    PT Start Time 1055    PT Stop Time 1140    PT Time Calculation (min) 45 min    Activity Tolerance Patient tolerated treatment well    Behavior During Therapy [name] for tasks assessed/performed                 Past Medical History:  Diagnosis Date   Arthritis    Hemorrhoids    Hypercalcemia    Hyperthyroidism    Peripheral neuropathy    Past Surgical History:  Procedure Laterality Date   HEMORRHOID SURGERY     PARATHYROIDECTOMY  10/13/10   Patient Active Problem List   Diagnosis Date Noted   TIA (transient ischemic attack) 02/25/2023   Diplopia 01/31/2022   Paresthesia 01/31/2022   Cognitive impairment 01/31/2022   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 Stroke affected left side of body. He was in the hospital for a week. He did 8 weeks of home health after the stroke and he verbalized he feels the same. He did: squats, hip abduction, and single leg balance, and walking in his drive way. He feels the most unbalance later in the evening. He has  to tell himself to be careful and slow down his movement. He has the most challenge getting up from a chair.   PERTINENT HISTORY: TIA 02/25/2023; OA; peripheral neuropathy; ocular myasthenia gravis;  PAIN:  Are you having pain? He has occasional back pain along his lumbar spine. But once he sits down his pain goes away.  PRECAUTIONS: None  RED FLAGS: None   WEIGHT BEARING RESTRICTIONS: No  FALLS:  Has patient fallen in last 6 months? Yes. Number of falls 4  LIVING ENVIRONMENT: Lives with: lives with their family Lives in: House/apartment Stairs: two steps to go into the front door Has following equipment at home: Single point cane, Environmental consultant - 2 wheeled, shower chair, and Grab bars  OCCUPATION: Retired  PLOF: Independent, Independent with basic ADLs, Independent with gait, Independent with transfers, and Leisure: Watch TV; play canasta   PATIENT GOALS: Make getting out of a chair easier  NEXT MD VISIT: PRN  OBJECTIVE:  Note: Objective measures were completed at Evaluation unless otherwise noted.    PATIENT SURVEYS:  ABC scale 75%   COGNITION: Overall cognitive status: Within functional limits for tasks assessed     SENSATION: WFL    MUSCLE LENGTH: Hamstrings: decreased bilateral   POSTURE: rounded shoulders, forward head, and increased thoracic kyphosis   LOWER EXTREMITY ROM: WFL  LOWER EXTREMITY MMT:  MMT Right eval Left eval  Hip flexion 4  4-  Hip extension    Hip abduction 4 4  Hip adduction 4 4  Hip internal rotation    Hip external rotation    Knee flexion 4 4  Knee extension 4+ 4  Ankle dorsiflexion    Ankle plantarflexion    Ankle inversion    Ankle eversion     (Blank rows = not tested)   FUNCTIONAL TESTS:  5 times sit to stand: 21.01 sec no UE support Timed up and go (TUG): 13.76 sec patient ran into PT's computer that was not in walking path  Patient unable to stand on one leg without UE support  05/18/23 DGI 22/24   06/22/23 TUG 19.68  SEC  06/22/23  5XSTS 27.3 sec   GAIT:  Level of assistance: Complete Independence Comments: Occasional sway in gait pattern; decreased cadence                                                                                                                                TREATMENT DATE:   06/22/23  NuStep L5 X 3 min - stopped due to lower leg pain/fatigue TUG, 5XSTS Seated 4#AW LAQ 2x10, march 2x 10   - challenging Standing hip ABD 4#AW 1x10 - fatigues by end of set Standing HS Curl 4# AW 2 x 10 B  Seated heel raises with 4# AW x 20 Hurdle lateral up and overs x 10 B 4#AW Hurdle step overs lateral and fwd - no UE support min A Tandem stance ---> partial tandem stance---> tandem walking with CGA Leg press 75# 2x10  06/15/23 NuStep L1 x 1 min then L 2 x 1 min - at end today (stopped due to fatigue) Leg press 75# 2x10 Heel raises x 10 Toe/heel walking at barre Rockerboard for gastroc stretch x 1 min Sit to stand x 10  Squats with 5# weights B x 10 to chair + foam Seated 4#AW LAQ 2x10, march 2x 10   - challenging Standing hip ABD 4#AW 1x10 some pain in back when doing L side Standing HS Curl 4# AW x 10 B - then short rest Hurdle lateral up and overs x 10 B 4#AW Hurdle step overs lateral and fwd - no UE support Airex balance: step ups fwd x 10 B Resisted walking 10# Bwd and sidestepping B x 5 ea  06/08/23 NuStep L5 x 5 min Leg press 75# 2x10 Heel raises x 10 Toe/heel walking at barre Rockerboard for gastroc stretch Hurdle lateral up and overs x 10 B Standing hip ABD red loop 2x10 Standing hip Ext red loop 1x10 - then needed to sit due to legs hurting Seated LAQ 2x10, march 1x 10  5#AW Sit to stand x 10  Seated HS stretch 2x30 sec B Airex balance: step ups fwd 2x5 B, lateral up and overs x 5 B - close supervision   05/18/23 NuStep L5 x 3 min DGI 22/24 Sit to stand x 10 then  feels in legs from knees down Seated LAQ, march, ER  3# 2x10 Standing 3# AW march, heel raises, hip  ABD x10 , hip ext x 10, HS curl Seated HS stretch 2x30 sec B Supine bridge 2 x 12 S/L clam green band 2x10 Airex balance: EO, EC - some drifting bwd; EO with head turns - some drifting bwd with head turns L  05/08/2023 Initial Evaluation & HEP created     PATIENT EDUCATION:  Education details: POC; evaluation findings; sit to stand safety Person educated: Patient Education method: Explanation, Demonstration, and Handouts Education comprehension: verbalized understanding, returned demonstration, and needs further education  HOME EXERCISE PROGRAM: Access Code: [email or web address] URL: [email or web address] Date: 05/08/2023 Prepared by: Penelope Bowie  Exercises - Supine Bridge  - 1 x daily - 7 x weekly - 2 sets - 10 reps - Sit to Stand Without Arm Support  - 1 x daily - 7 x weekly - 2 sets - 10 reps - Seated Long Arc Quad  - 1 x daily - 7 x weekly - 2 sets - 10 reps - 2 hold - Seated Hamstring Stretch  - 1 x daily - 7 x weekly - 2 sets - 20-30 hold  ASSESSMENT:  CLINICAL IMPRESSION: Patient presents with Steve Thompson today and reports increased back pain lately. He denies pain today as he only gets it when he is standing. He states he does not feel like therapy has helped at all. His TUG and 5XSTS have actually worsened by 6 seconds, however, overall patient presented less stable today than at previous visits. He was not able to tolerate 5 min on the Nustep as he had at previous visits. We focused more on balance today and he struggled with forward hurdles tending to lose balance posteriorly when stepping over with R LE. His tandem walking improved with increased reps. Patient would like to finish out his POC and then discharge.    Eval: Patient is a 89 y.o. male who was seen today for physical therapy evaluation and treatment for balance impairments. Steve Thompson presents today with some left sided weakness after a TIA on 02/24/2023. He did 8 weeks of home health physical therapy and he verbalized  feeling like his strength and balance are still the same. Educated patient on the benefit of coming to an outpatient clinic and how we can manipulate environment to make exercises harder and challenge balance. When performing TUG patient ran into PT's cart which was not place into the patient's direct path. Patient verbalized that he feels the most unsteady when he stands up from a chair, and sometimes he requires multiple attempts to successfully complete stand. Based on evaluation noted muscle weakness, poor posture, and decreased single leg balance. Patient will benefit from skilled PT to address the below impairments and improve overall function.   OBJECTIVE IMPAIRMENTS: decreased balance, decreased ROM, decreased strength, dizziness, hypomobility, increased muscle spasms, impaired flexibility, improper body mechanics, postural dysfunction, prosthetic dependency , and pain.   ACTIVITY LIMITATIONS: lifting, standing, squatting, stairs, and transfers  PARTICIPATION LIMITATIONS: cleaning, shopping, and community activity  PERSONAL FACTORS: Age and 1-2 comorbidities: OA; hx TIA are also affecting patient's functional outcome.   REHAB POTENTIAL: Good  CLINICAL DECISION MAKING: Stable/uncomplicated  EVALUATION COMPLEXITY: Low   GOALS: Goals reviewed with patient? Yes  SHORT TERM GOALS: Target date: 06/05/2023  Patient will be independent with initial HEP. Baseline:  Goal status: IN PROGRESS 06/08/23  2.  Patient will report > or = to 30% improvement in balance since  starting PT. Baseline:  Goal status: IN PROGRESS     LONG TERM GOALS: Target date: 07/03/2023  Patient will demonstrate independence in advanced HEP. Baseline:  Goal status: INITIAL  2.  Patient will report > or = to 60% improvement in balance since starting PT. Baseline:  Goal status: INITIAL  3.  Patient will demonstrate safe transfer strategies and maintain standing balance after sit-to-stand. Baseline:  Goal  status: INITIAL  4.  Patient will perform TUG in < or = to 11 sec to decrease falls risk. Baseline: 13.76 sec Goal status: IN PROGRESS  5.  Patient will perform 5STS in < or = to 18 sec to maintain independence and functional mobility. Baseline:  Goal status: IN PROGRESS  6.  Patient will report confidence >= 8/10 on the Activities-specific Balance Confidence (ABC) Scale for walking in the home, outdoors, and in crowded environments Baseline:  Goal status: INITIAL   PLAN:  PT FREQUENCY: 1-2x/week  PT DURATION: 8 weeks  PLANNED INTERVENTIONS: 97164- PT Re-evaluation, 97110-Therapeutic exercises, 97530- Therapeutic activity, 97112- Neuromuscular re-education, 97535- Self Care, 81191- Manual therapy, [id number]- Gait training, [phone]- Canalith repositioning, [id number]- Aquatic Therapy, [id number]- Electrical stimulation (unattended), [id number]- Electrical stimulation (manual), 97016- Vasopneumatic device, [id number]- Ultrasound, [id number]- Ionotophoresis 4mg /ml Dexamethasone, Patient/Family education, Balance training, Stair training, Taping, Dry Needling, Joint mobilization, Joint manipulation, Spinal manipulation, Spinal mobilization, Vestibular training, Cryotherapy, and Moist heat  PLAN FOR NEXT SESSION:  Check LTGs and decide to d/c or extend one more visit,  finalize safe HEP for balance  Jinx Mourning, PT  06/22/23 11:55 AM [facility] [address] [place], [place] [address] Phone # [phone] Fax [phone]

## 2023-06-22 ENCOUNTER — Ambulatory Visit: Attending: Family Medicine | Admitting: Physical Therapy

## 2023-06-22 ENCOUNTER — Encounter: Payer: Self-pay | Admitting: Physical Therapy

## 2023-06-22 DIAGNOSIS — R2689 Other abnormalities of gait and mobility: Secondary | ICD-10-CM | POA: Insufficient documentation

## 2023-06-22 DIAGNOSIS — R2681 Unsteadiness on feet: Secondary | ICD-10-CM | POA: Insufficient documentation

## 2023-06-22 DIAGNOSIS — M6281 Muscle weakness (generalized): Secondary | ICD-10-CM | POA: Diagnosis not present

## 2023-06-26 DIAGNOSIS — I959 Hypotension, unspecified: Secondary | ICD-10-CM | POA: Diagnosis not present

## 2023-06-26 DIAGNOSIS — S90122A Contusion of left lesser toe(s) without damage to nail, initial encounter: Secondary | ICD-10-CM | POA: Diagnosis not present

## 2023-06-26 DIAGNOSIS — E860 Dehydration: Secondary | ICD-10-CM | POA: Diagnosis not present

## 2023-06-26 DIAGNOSIS — R339 Retention of urine, unspecified: Secondary | ICD-10-CM | POA: Diagnosis not present

## 2023-06-26 DIAGNOSIS — Z6826 Body mass index (BMI) 26.0-26.9, adult: Secondary | ICD-10-CM | POA: Diagnosis not present

## 2023-06-27 ENCOUNTER — Encounter (HOSPITAL_COMMUNITY): Payer: Self-pay | Admitting: Emergency Medicine

## 2023-06-27 ENCOUNTER — Emergency Department (HOSPITAL_COMMUNITY)

## 2023-06-27 ENCOUNTER — Inpatient Hospital Stay (HOSPITAL_COMMUNITY)
Admission: EM | Admit: 2023-06-27 | Discharge: 2023-07-09 | DRG: 683 | Disposition: A | Discharge status: Home Health | Attending: Internal Medicine | Admitting: Internal Medicine

## 2023-06-27 ENCOUNTER — Other Ambulatory Visit: Payer: Self-pay

## 2023-06-27 DIAGNOSIS — E785 Hyperlipidemia, unspecified: Secondary | ICD-10-CM | POA: Diagnosis not present

## 2023-06-27 DIAGNOSIS — R296 Repeated falls: Secondary | ICD-10-CM | POA: Diagnosis present

## 2023-06-27 DIAGNOSIS — R0989 Other specified symptoms and signs involving the circulatory and respiratory systems: Secondary | ICD-10-CM | POA: Diagnosis not present

## 2023-06-27 DIAGNOSIS — M419 Scoliosis, unspecified: Secondary | ICD-10-CM | POA: Diagnosis not present

## 2023-06-27 DIAGNOSIS — R338 Other retention of urine: Secondary | ICD-10-CM | POA: Diagnosis not present

## 2023-06-27 DIAGNOSIS — G629 Polyneuropathy, unspecified: Secondary | ICD-10-CM | POA: Diagnosis not present

## 2023-06-27 DIAGNOSIS — E871 Hypo-osmolality and hyponatremia: Secondary | ICD-10-CM | POA: Diagnosis not present

## 2023-06-27 DIAGNOSIS — Y92003 Bedroom of unspecified non-institutional (private) residence as the place of occurrence of the external cause: Secondary | ICD-10-CM

## 2023-06-27 DIAGNOSIS — R1084 Generalized abdominal pain: Secondary | ICD-10-CM | POA: Diagnosis not present

## 2023-06-27 DIAGNOSIS — N179 Acute kidney failure, unspecified: Secondary | ICD-10-CM | POA: Diagnosis not present

## 2023-06-27 DIAGNOSIS — R6510 Systemic inflammatory response syndrome (SIRS) of non-infectious origin without acute organ dysfunction: Secondary | ICD-10-CM | POA: Diagnosis present

## 2023-06-27 DIAGNOSIS — Z8673 Personal history of transient ischemic attack (TIA), and cerebral infarction without residual deficits: Secondary | ICD-10-CM | POA: Diagnosis not present

## 2023-06-27 DIAGNOSIS — Z7189 Other specified counseling: Secondary | ICD-10-CM | POA: Diagnosis not present

## 2023-06-27 DIAGNOSIS — E876 Hypokalemia: Secondary | ICD-10-CM | POA: Diagnosis not present

## 2023-06-27 DIAGNOSIS — N189 Chronic kidney disease, unspecified: Secondary | ICD-10-CM | POA: Diagnosis present

## 2023-06-27 DIAGNOSIS — I447 Left bundle-branch block, unspecified: Secondary | ICD-10-CM | POA: Diagnosis not present

## 2023-06-27 DIAGNOSIS — E7800 Pure hypercholesterolemia, unspecified: Secondary | ICD-10-CM

## 2023-06-27 DIAGNOSIS — R4189 Other symptoms and signs involving cognitive functions and awareness: Secondary | ICD-10-CM | POA: Diagnosis not present

## 2023-06-27 DIAGNOSIS — S99922A Unspecified injury of left foot, initial encounter: Secondary | ICD-10-CM | POA: Diagnosis not present

## 2023-06-27 DIAGNOSIS — M85872 Other specified disorders of bone density and structure, left ankle and foot: Secondary | ICD-10-CM | POA: Diagnosis not present

## 2023-06-27 DIAGNOSIS — W19XXXA Unspecified fall, initial encounter: Secondary | ICD-10-CM | POA: Diagnosis not present

## 2023-06-27 DIAGNOSIS — N401 Enlarged prostate with lower urinary tract symptoms: Secondary | ICD-10-CM | POA: Diagnosis not present

## 2023-06-27 DIAGNOSIS — W06XXXA Fall from bed, initial encounter: Secondary | ICD-10-CM | POA: Diagnosis present

## 2023-06-27 DIAGNOSIS — R7989 Other specified abnormal findings of blood chemistry: Secondary | ICD-10-CM | POA: Diagnosis present

## 2023-06-27 DIAGNOSIS — Z515 Encounter for palliative care: Secondary | ICD-10-CM | POA: Diagnosis not present

## 2023-06-27 DIAGNOSIS — R911 Solitary pulmonary nodule: Secondary | ICD-10-CM | POA: Diagnosis present

## 2023-06-27 DIAGNOSIS — M4855XA Collapsed vertebra, not elsewhere classified, thoracolumbar region, initial encounter for fracture: Secondary | ICD-10-CM | POA: Diagnosis present

## 2023-06-27 DIAGNOSIS — Y92009 Unspecified place in unspecified non-institutional (private) residence as the place of occurrence of the external cause: Secondary | ICD-10-CM

## 2023-06-27 DIAGNOSIS — S0003XA Contusion of scalp, initial encounter: Secondary | ICD-10-CM | POA: Diagnosis not present

## 2023-06-27 DIAGNOSIS — I959 Hypotension, unspecified: Secondary | ICD-10-CM | POA: Diagnosis present

## 2023-06-27 DIAGNOSIS — G7000 Myasthenia gravis without (acute) exacerbation: Secondary | ICD-10-CM | POA: Diagnosis not present

## 2023-06-27 DIAGNOSIS — K8689 Other specified diseases of pancreas: Secondary | ICD-10-CM | POA: Diagnosis not present

## 2023-06-27 DIAGNOSIS — E890 Postprocedural hypothyroidism: Secondary | ICD-10-CM | POA: Diagnosis not present

## 2023-06-27 DIAGNOSIS — Z79890 Hormone replacement therapy (postmenopausal): Secondary | ICD-10-CM

## 2023-06-27 DIAGNOSIS — R531 Weakness: Secondary | ICD-10-CM | POA: Diagnosis not present

## 2023-06-27 DIAGNOSIS — R220 Localized swelling, mass and lump, head: Secondary | ICD-10-CM | POA: Diagnosis not present

## 2023-06-27 DIAGNOSIS — Z7982 Long term (current) use of aspirin: Secondary | ICD-10-CM

## 2023-06-27 DIAGNOSIS — M4850XA Collapsed vertebra, not elsewhere classified, site unspecified, initial encounter for fracture: Secondary | ICD-10-CM | POA: Diagnosis present

## 2023-06-27 DIAGNOSIS — Z66 Do not resuscitate: Secondary | ICD-10-CM | POA: Diagnosis not present

## 2023-06-27 DIAGNOSIS — S0093XA Contusion of unspecified part of head, initial encounter: Secondary | ICD-10-CM | POA: Diagnosis present

## 2023-06-27 DIAGNOSIS — E861 Hypovolemia: Secondary | ICD-10-CM | POA: Diagnosis present

## 2023-06-27 DIAGNOSIS — S22000A Wedge compression fracture of unspecified thoracic vertebra, initial encounter for closed fracture: Secondary | ICD-10-CM | POA: Diagnosis not present

## 2023-06-27 DIAGNOSIS — I10 Essential (primary) hypertension: Secondary | ICD-10-CM | POA: Diagnosis not present

## 2023-06-27 DIAGNOSIS — I9589 Other hypotension: Secondary | ICD-10-CM | POA: Diagnosis not present

## 2023-06-27 DIAGNOSIS — Z87891 Personal history of nicotine dependence: Secondary | ICD-10-CM

## 2023-06-27 DIAGNOSIS — Z7901 Long term (current) use of anticoagulants: Secondary | ICD-10-CM

## 2023-06-27 DIAGNOSIS — I700 Atherosclerosis of aorta: Secondary | ICD-10-CM | POA: Diagnosis not present

## 2023-06-27 DIAGNOSIS — M549 Dorsalgia, unspecified: Secondary | ICD-10-CM | POA: Diagnosis not present

## 2023-06-27 DIAGNOSIS — M5030 Other cervical disc degeneration, unspecified cervical region: Secondary | ICD-10-CM | POA: Diagnosis not present

## 2023-06-27 DIAGNOSIS — R10817 Generalized abdominal tenderness: Secondary | ICD-10-CM | POA: Diagnosis not present

## 2023-06-27 DIAGNOSIS — M19072 Primary osteoarthritis, left ankle and foot: Secondary | ICD-10-CM | POA: Diagnosis not present

## 2023-06-27 DIAGNOSIS — I480 Paroxysmal atrial fibrillation: Secondary | ICD-10-CM | POA: Diagnosis present

## 2023-06-27 DIAGNOSIS — R404 Transient alteration of awareness: Secondary | ICD-10-CM | POA: Diagnosis not present

## 2023-06-27 DIAGNOSIS — Z043 Encounter for examination and observation following other accident: Secondary | ICD-10-CM | POA: Diagnosis not present

## 2023-06-27 DIAGNOSIS — Z7902 Long term (current) use of antithrombotics/antiplatelets: Secondary | ICD-10-CM

## 2023-06-27 LAB — URINALYSIS, W/ REFLEX TO CULTURE (INFECTION SUSPECTED)
Bacteria, UA: NONE SEEN
Bilirubin Urine: NEGATIVE
Glucose, UA: NEGATIVE mg/dL
Ketones, ur: NEGATIVE mg/dL
Leukocytes,Ua: NEGATIVE
Nitrite: NEGATIVE
Protein, ur: NEGATIVE mg/dL
Specific Gravity, Urine: 1.008 (ref 1.005–1.030)
pH: 6 (ref 5.0–8.0)

## 2023-06-27 LAB — TROPONIN I (HIGH SENSITIVITY)
Troponin I (High Sensitivity): 22 ng/L — ABNORMAL HIGH (ref <18)
Troponin I (High Sensitivity): 58 ng/L — ABNORMAL HIGH (ref <18)
Troponin I (High Sensitivity): 328 ng/L (ref <18)
Troponin I (High Sensitivity): 287 ng/L (ref <18)
Troponin I (High Sensitivity): 317 ng/L (ref <18)

## 2023-06-27 LAB — COMPREHENSIVE METABOLIC PANEL WITH GFR
ALT: 16 U/L (ref 0–44)
AST: 28 U/L (ref 15–41)
Albumin: 3.4 g/dL — ABNORMAL LOW (ref 3.5–5.0)
Alkaline Phosphatase: 50 U/L (ref 38–126)
Anion gap: 10 (ref 5–15)
BUN: 24 mg/dL — ABNORMAL HIGH (ref 8–23)
CO2: 23 mmol/L (ref 22–32)
Calcium: 10.1 mg/dL (ref 8.9–10.3)
Chloride: 100 mmol/L (ref 98–111)
Creatinine, Ser: 2.95 mg/dL — ABNORMAL HIGH (ref 0.61–1.24)
GFR, Estimated: 20 mL/min — ABNORMAL LOW (ref >60)
Glucose, Bld: 127 mg/dL — ABNORMAL HIGH (ref 70–99)
Potassium: 3.8 mmol/L (ref 3.5–5.1)
Sodium: 133 mmol/L — ABNORMAL LOW (ref 135–145)
Total Bilirubin: 1 mg/dL (ref 0.0–1.2)
Total Protein: 5.6 g/dL — ABNORMAL LOW (ref 6.5–8.1)

## 2023-06-27 LAB — CBC WITH DIFFERENTIAL/PLATELET
Abs Immature Granulocytes: 0.19 10*3/uL — ABNORMAL HIGH (ref 0.00–0.07)
Basophils Absolute: 0.1 10*3/uL (ref 0.0–0.1)
Basophils Relative: 0 %
Eosinophils Absolute: 0.1 10*3/uL (ref 0.0–0.5)
Eosinophils Relative: 0 %
HCT: 39 % (ref 39.0–52.0)
Hemoglobin: 13.2 g/dL (ref 13.0–17.0)
Immature Granulocytes: 1 %
Lymphocytes Relative: 6 %
Lymphs Abs: 1 10*3/uL (ref 0.7–4.0)
MCH: 30.3 pg (ref 26.0–34.0)
MCHC: 33.8 g/dL (ref 30.0–36.0)
MCV: 89.4 fL (ref 80.0–100.0)
Monocytes Absolute: 1.9 10*3/uL — ABNORMAL HIGH (ref 0.1–1.0)
Monocytes Relative: 12 %
Neutro Abs: 13.3 10*3/uL — ABNORMAL HIGH (ref 1.7–7.7)
Neutrophils Relative %: 81 %
Platelets: 273 10*3/uL (ref 150–400)
RBC: 4.36 MIL/uL (ref 4.22–5.81)
RDW: 13.7 % (ref 11.5–15.5)
WBC: 16.5 10*3/uL — ABNORMAL HIGH (ref 4.0–10.5)
nRBC: 0 % (ref 0.0–0.2)

## 2023-06-27 LAB — I-STAT CG4 LACTIC ACID, ED: Lactic Acid, Venous: 1.6 mmol/L (ref 0.5–1.9)

## 2023-06-27 LAB — I-STAT CHEM 8, ED
BUN: 24 mg/dL — ABNORMAL HIGH (ref 8–23)
Calcium, Ion: 1.28 mmol/L (ref 1.15–1.40)
Chloride: 99 mmol/L (ref 98–111)
Creatinine, Ser: 2.9 mg/dL — ABNORMAL HIGH (ref 0.61–1.24)
Glucose, Bld: 124 mg/dL — ABNORMAL HIGH (ref 70–99)
HCT: 39 % (ref 39.0–52.0)
Hemoglobin: 13.3 g/dL (ref 13.0–17.0)
Potassium: 3.6 mmol/L (ref 3.5–5.1)
Sodium: 133 mmol/L — ABNORMAL LOW (ref 135–145)
TCO2: 21 mmol/L — ABNORMAL LOW (ref 22–32)

## 2023-06-27 LAB — TSH: TSH: 0.558 u[IU]/mL (ref 0.350–4.500)

## 2023-06-27 LAB — CK: Total CK: 102 U/L (ref 49–397)

## 2023-06-27 MED ORDER — TAMSULOSIN HCL 0.4 MG PO CAPS
0.8000 mg | ORAL_CAPSULE | Freq: Every day | ORAL | Status: DC
  Administered 2023-06-27 – 2023-07-09 (×11): 0.8 mg via ORAL
  Filled 2023-06-27 (×13): qty 2

## 2023-06-27 MED ORDER — ROSUVASTATIN CALCIUM 20 MG PO TABS
20.0000 mg | ORAL_TABLET | Freq: Every day | ORAL | Status: DC
  Administered 2023-06-27 – 2023-07-09 (×11): 20 mg via ORAL
  Filled 2023-06-27 (×14): qty 1

## 2023-06-27 MED ORDER — SODIUM CHLORIDE 0.9 % IV SOLN
1.0000 g | Freq: Once | INTRAVENOUS | Status: AC
  Administered 2023-06-27: 1 g via INTRAVENOUS
  Filled 2023-06-27: qty 10

## 2023-06-27 MED ORDER — CLOPIDOGREL BISULFATE 75 MG PO TABS
75.0000 mg | ORAL_TABLET | Freq: Every day | ORAL | Status: DC
  Administered 2023-06-27 – 2023-07-09 (×11): 75 mg via ORAL
  Filled 2023-06-27 (×13): qty 1

## 2023-06-27 MED ORDER — LORATADINE 10 MG PO TABS
10.0000 mg | ORAL_TABLET | Freq: Every day | ORAL | Status: DC
  Administered 2023-06-27 – 2023-07-09 (×11): 10 mg via ORAL
  Filled 2023-06-27 (×13): qty 1

## 2023-06-27 MED ORDER — HEPARIN SODIUM (PORCINE) 5000 UNIT/ML IJ SOLN
5000.0000 [IU] | Freq: Three times a day (TID) | INTRAMUSCULAR | Status: DC
  Administered 2023-06-27 – 2023-07-09 (×31): 5000 [IU] via SUBCUTANEOUS
  Filled 2023-06-27 (×37): qty 1

## 2023-06-27 MED ORDER — SODIUM CHLORIDE 0.9% FLUSH
3.0000 mL | Freq: Two times a day (BID) | INTRAVENOUS | Status: DC
  Administered 2023-06-27 – 2023-07-09 (×17): 3 mL via INTRAVENOUS

## 2023-06-27 MED ORDER — SODIUM CHLORIDE 0.9 % IV BOLUS
1000.0000 mL | Freq: Once | INTRAVENOUS | Status: AC
  Administered 2023-06-27: 1000 mL via INTRAVENOUS

## 2023-06-27 MED ORDER — ACETAMINOPHEN 325 MG PO TABS
650.0000 mg | ORAL_TABLET | Freq: Four times a day (QID) | ORAL | Status: DC | PRN
  Administered 2023-07-01 – 2023-07-03 (×3): 650 mg via ORAL
  Filled 2023-06-27 (×3): qty 2

## 2023-06-27 MED ORDER — LACTATED RINGERS IV SOLN: INTRAVENOUS | Status: AC

## 2023-06-27 MED ORDER — ALBUTEROL SULFATE (2.5 MG/3ML) 0.083% IN NEBU: 2.5000 mg | INHALATION_SOLUTION | Freq: Four times a day (QID) | RESPIRATORY_TRACT | Status: DC | PRN

## 2023-06-27 MED ORDER — ACETAMINOPHEN 650 MG RE SUPP
650.0000 mg | Freq: Four times a day (QID) | RECTAL | Status: DC | PRN
Start: 2023-06-27 — End: 2023-07-09

## 2023-06-27 MED ORDER — LEVOTHYROXINE SODIUM 100 MCG PO TABS
100.0000 ug | ORAL_TABLET | Freq: Every day | ORAL | Status: DC
  Administered 2023-06-28 – 2023-07-09 (×10): 100 ug via ORAL
  Filled 2023-06-27 (×12): qty 1

## 2023-06-27 MED ORDER — GABAPENTIN 300 MG PO CAPS
300.0000 mg | ORAL_CAPSULE | Freq: Two times a day (BID) | ORAL | Status: DC
  Administered 2023-06-27 – 2023-07-09 (×20): 300 mg via ORAL
  Filled 2023-06-27 (×24): qty 1

## 2023-06-27 NOTE — ED Triage Notes (Signed)
 Pt BIB [facility] due to hypotension and being unresponsive.  Pt wife found him on the floor this morning after going to bed last night.  Pt reports midline back pain and right heel pain.  No head and neck pain reported.  C-collar in place due unknowns.  Pt does take Eliquis and Plavix .  BP 60 palpated on scene.  18g left AC.  500ml NS. VS BP 100/60, HR 90, 88%RA normally not on oxygen.

## 2023-06-27 NOTE — Plan of Care (Signed)

## 2023-06-27 NOTE — H&P (Addendum)
 History and Physical    Patient: Steve Thompson:[id number] DOB: Nov 06, 1931 DOA: 06/27/2023 DOS: the patient was seen and examined on 06/27/2023 PCP: [name], MD  Patient coming from: Home via EMS  Chief Complaint:  Chief Complaint  Patient presents with   Hypotension   HPI: Steve Thompson is a 89 y.o. male with medical history significant of hyperthyroidism, hypercalcemia, peripheral neuropathy, and arthritis presents with  falls and urinary retention. He is accompanied by his wife who provides most of the history.  He has experienced multiple falls, with the most recent occurring this morning when he fell out of bed. No head injury was reported, but he was very unresponsive following the fall. His wife reports that he was barely able to talk this morning when she found him on the floor.  He has been experiencing urinary retention, characterized by dribbling and an inability to urinate effectively for a couple of days.  His wife notes that he was scheduled to see a urologist today prior to having to come to the hospital.  His dose of tamsulosin  had recently been increased  He has a history of chronic back pain, which has been persistent for years. He also reported lower abdominal pain.  He has cognitive impairment, for which he sees a neurologist. His wife notes that he gets very confused, which is his baseline condition.  He has not eaten in a couple of days, raising concerns about his nutritional intake. En route with EMS patient had been given 500 mL of normal saline IV fluids after initial systolic blood pressure reported to be 60.  Vital signs were noted to be 100/60, Thompson rate 90, O2 saturations 88% on room air.  In the emergency department patient was noted to be afebrile with pulse elevated up to 112, blood pressures 89/64 to 134/96, and O2 saturations currently maintained on room air.  Labs significant for WBC 16.5, sodium 133 BUN 24,  creatinine 2.9, troponin 22,  CK1 02, lactic acid 1.6.  Chest x-ray noted no acute abnormality, urinalysis noted small hemoglobin without significant signs for infection.  CT scan of the head noted small left scalp soft tissue suspicious for contusion/hematoma without any acute intracranial abnormality.  CT scan of the chest abdomen pelvis noted enlarged superior segment left lower lobe lung nodule since last year now measuring 1.7 cm that was highly suspicious for bronchogenic carcinoma, chronic T12 and L1 compression fractures,  abnormal urinary bladder wall thickening despite decompression via Foley catheter with inflammation reflecting recent bilateral obstructive uropathy, and small pleural effusion.  Blood cultures had been obtained.  Patient foley catheter placed which returned over 900 mL of urine.  Patient had been given 1 L of patient had received 1 L normal saline IV fluids and had empirically been given Rocephin IV.  Review of Systems: As mentioned in the history of present illness. All other systems reviewed and are negative. Past Medical History:  Diagnosis Date   Arthritis    Hemorrhoids    Hypercalcemia    Hyperthyroidism    Peripheral neuropathy    Past Surgical History:  Procedure Laterality Date   HEMORRHOID SURGERY     PARATHYROIDECTOMY  10/13/10   Social History:  reports that he has quit smoking. He has never used smokeless tobacco. He reports that he does not drink alcohol and does not use drugs.  No Known Allergies  Family History  Problem Relation Age of Onset   Cancer Sister  lung    Prior to Admission medications   Medication Sig Start Date End Date Taking? Authorizing Provider  aspirin  EC 81 MG tablet Take 1 tablet (81 mg total) by mouth daily. 02/27/23   Singh, Prashant K, MD  clopidogrel  (PLAVIX ) 75 MG tablet Take 1 tablet (75 mg total) by mouth daily. 02/28/23   Singh, Prashant K, MD  gabapentin (NEURONTIN) 300 MG capsule Take 300 mg by mouth 4 (four) times daily.  Takes 1 capsule  in am  3 capsules at night ( around 7:30p)    [provider]  levothyroxine  (SYNTHROID ) 100 MCG tablet Take 1 tablet (100 mcg total) by mouth daily. 02/28/23   Singh, Prashant K, MD  loratadine (CLARITIN) 10 MG tablet 1 tablet Orally Once a day    [provider]  pyridostigmine  (MESTINON ) 60 MG tablet Take 60 mg by mouth 3 (three) times daily. Patient not taking Patient not taking: Reported on 05/08/2023    [provider]  rosuvastatin  (CRESTOR ) 20 MG tablet Take 1 tablet (20 mg total) by mouth daily. 02/28/23   Singh, Prashant K, MD  tamsulosin  (FLOMAX ) 0.4 MG CAPS capsule Take 0.4 mg by mouth daily.    [provider]    Physical Exam: Vitals:   06/27/23 1000 06/27/23 1030 06/27/23 1045 06/27/23 1100  BP: 109/77 120/86 109/78 115/81  Pulse: (!) 107 100 (!) 104 (!) 107  Resp: 16 12 17 18   Temp:      TempSrc:      SpO2: 99% 100% 96% 97%  Weight:      Height:       Constitutional: Elderly male who appears chronically ill, but in no acute distress and able to answer questions Eyes: PERRL, lids and conjunctivae normal ENMT: Mucous membranes are dry. Normal dentition.  Neck: normal, supple  Respiratory: clear to auscultation bilaterally, no wheezing, no crackles. Normal respiratory effort.   Cardiovascular: Irregular irregular. No extremity edema. 2+ pedal pulses.   Abdomen: Mild tenderness palpation of the lower abdomen.  No masses palpated.  Bowel sounds positive.  Musculoskeletal: no clubbing / cyanosis.Normal muscle tone.  Tenderness palpation of the lumbar spine. Skin: Bruising noted of the left foot, abrasion to the right heel, and contusion to the left forehead Neurologic: CN 2-12 grossly intact.   Patient appears able to move all 4 extremities with normal strength. Psychiatric: Patient currently appears to be alert and oriented x 3. Normal mood.   Data Reviewed:   EKG revealed atrial fibrillation at 114 bpm.  Reviewed labs, imaging, and  pertinent records as documented. Assessment and Plan:  Acute kidney injury secondary to BPH with urinary retention Patient presents with creatinine elevated up to 2.95 with BUN 24.  Baseline creatinine previously noted to be 0.85.  CK was 102.  Patient had over 900 mL of urine after insertion of Foley catheter.  CT noted abnormal urinary bladder wall thickening despite decompression via Foley catheter with inflammation reflecting recent bilateral obstructive uropathydecompression of the bladder with wall thickening.  Urinalysis did not show significant signs for infection. - Admit to telemetry bed - Strict I&O's  - Avoid possible nephrotoxic agents at this time - Continue IV fluids - Continue increased dose of tamsulosin  - Recheck kidney function in a.m.  SIRS Acute.  WBC elevated at 16.5.  Patient was noted to be afebrile, but was tachycardic meeting SIRS criteria.  Chest x-ray was otherwise clear.  Urinalysis did not show significant concern for infection.  No clear source of  infection noted at this time. Patient had been given empiric antibiotics of Rocephin. - Follow-up blood cultures  Fall at home Head contusion Generalized weakness Patient reportedly has had multiple falls in the last couple of days.  CT scan of the head did not note contusion of the left temple without intracranial bleeding.  He may have suffered a concussion as the cause of him initially being unresponsive. - PT/OT to evaluate  Elevated troponin High-sensitivity troponin elevated 22->58.  Patient did not report any complaints of chest pain. - Continue to trend cardiac troponins - May warrant further investigation and echocardiogram if troponins continue to trend up.  Lung nodule Patient noted to have an enlarged 1.7 cm left lower lobe lung nodule that is highly suspicious for bronchogenic carcinoma.  - Recommended outpatient PET-CT and referral to pulmonology  Hyponatremia Acute.  Sodium noted to be 133.   Suspecting a hypovolemic hyponatremia as cause of symptoms. - Recheck sodium levels in a.m.  History of CVA Patient suffered a acute right nonhemorrhagic thalamocapsular ischemic stroke back in 02/2023.   - Continue Plavix  and statin  Postsurgical hypothyroidism Last TSH noted to be 1.771 when checked on 2/29. - Add-on TSH - Continue levothyroxine   Compression fracture Chronic.  Patient noted to have chronic T12 and L1 compression fractures where he has tenderness to palpation on physical exam.  Cognitive impairment Patient is followed in outpatient setting by neurology. - Delirium precautions  Hyperlipidemia - Continue Crestor    DVT prophylaxis: Advance Care Planning:   Code Status: Full Code  Consults: none  Family Communication: Wife updated at bedside Severity of Illness: The appropriate patient status for this patient is INPATIENT. Inpatient status is judged to be reasonable and necessary in order to provide the required intensity of service to ensure the patient's safety. The patient's presenting symptoms, physical exam findings, and initial radiographic and laboratory data in the context of their chronic comorbidities is felt to place them at high risk for further clinical deterioration. Furthermore, it is not anticipated that the patient will be medically stable for discharge from the hospital within 2 midnights of admission.   * I certify that at the point of admission it is my clinical judgment that the patient will require inpatient hospital care spanning beyond 2 midnights from the point of admission due to high intensity of service, high risk for further deterioration and high frequency of surveillance required.*  Author: Lena Qualia, MD 06/27/2023 11:49 AM  For on call review [email or web address].

## 2023-06-27 NOTE — ED Notes (Signed)
 Urine sent to lab

## 2023-06-27 NOTE — ED Notes (Signed)
 [place] @ bedside

## 2023-06-27 NOTE — ED Notes (Signed)
 Patient transported to X-ray

## 2023-06-27 NOTE — ED Provider Notes (Signed)
 [place] EMERGENCY DEPARTMENT AT [facility] Provider Note   CSN: [id number] Arrival date & time: 06/27/23  6213     History  Chief Complaint  Patient presents with   Hypotension    Steve Thompson is a 89 y.o. male.  HPI 89 year old male presents after a fall.  The patient fell out of bed last night, stating he just rolled out of bed.  He denies any significant injuries from the fall but endorses that he has been having low back pain and abdominal pain for about a week.  Has been unable to urinate for about 3 days.  He denies any fevers, cough, chest pain, shortness of breath.  No headache or neck pain today.  He does take Eliquis and Plavix .  According to EMS on their transfer he had some hypotension on scene.  The patient states he has been feeling diffusely weak for the past few days.  He denies vomiting or diarrhea.  Home Medications Prior to Admission medications   Medication Sig Start Date End Date Taking? Authorizing Provider  aspirin  EC 81 MG tablet Take 1 tablet (81 mg total) by mouth daily. 02/27/23   Singh, Prashant K, MD  clopidogrel  (PLAVIX ) 75 MG tablet Take 1 tablet (75 mg total) by mouth daily. 02/28/23   Singh, Prashant K, MD  gabapentin (NEURONTIN) 300 MG capsule Take 300 mg by mouth 4 (four) times daily.  Takes 1 capsule in am  3 capsules at night ( around 7:30p)    [provider]  levothyroxine  (SYNTHROID ) 100 MCG tablet Take 1 tablet (100 mcg total) by mouth daily. 02/28/23   Singh, Prashant K, MD  loratadine (CLARITIN) 10 MG tablet 1 tablet Orally Once a day    [provider]  rosuvastatin  (CRESTOR ) 20 MG tablet Take 1 tablet (20 mg total) by mouth daily. 02/28/23   Singh, Prashant K, MD  tamsulosin  (FLOMAX ) 0.4 MG CAPS capsule Take 0.4 mg by mouth daily.    [provider]      Allergies    Patient has no known allergies.    Review of Systems   Review of Systems  Constitutional:  Negative for fever.  Respiratory:   Negative for shortness of breath.   Cardiovascular:  Negative for chest pain.  Gastrointestinal:  Positive for abdominal pain. Negative for vomiting.  Musculoskeletal:  Positive for back pain.  Neurological:  Positive for weakness. Negative for headaches.    Physical Exam Updated Vital Signs BP 115/81   Pulse (!) 107   Temp 97.7 F (36.5 C) (Oral)   Resp 18   Ht 5' 9 (1.753 m)   Wt 84 kg   SpO2 97%   BMI 27.35 kg/m  Physical Exam Vitals and nursing note reviewed.  Constitutional:      General: He is not in acute distress.    Appearance: He is well-developed. He is not ill-appearing or diaphoretic.     Interventions: Cervical collar in place.  HENT:     Head: Normocephalic and atraumatic.     Mouth/Throat:     Mouth: Mucous membranes are dry.  Eyes:     Extraocular Movements: Extraocular movements intact.     Pupils: Pupils are equal, round, and reactive to light.  Cardiovascular:     Rate and Rhythm: Tachycardia present. Rhythm irregular.     Pulses:          Radial pulses are 2+ on the right side and 2+ on the left side.  Dorsalis pedis pulses are 2+ on the right side and 2+ on the left side.     Heart sounds: Normal heart sounds.  Pulmonary:     Effort: Pulmonary effort is normal.     Breath sounds: Normal breath sounds. No wheezing.  Abdominal:     General: There is distension.     Palpations: Abdomen is soft.     Tenderness: There is abdominal tenderness.     Comments: Large bladder palpated  Musculoskeletal:     Cervical back: No spinous process tenderness or muscular tenderness.       Back:     Right hip: Normal range of motion.     Left hip: Normal range of motion.     Comments: Patient has a small abrasion to his right heel, no tenderness/pain Left fourth toe is bruised.  Skin:    General: Skin is warm and dry.  Neurological:     Mental Status: He is alert and oriented to person, place, and time.     Comments: Awake, alert, oriented to person,  place, time, situation. No facial droop or slurred speech. 5/5 strength in all 4 extremities.     ED Results / Procedures / Treatments   Labs (all labs ordered are listed, but only abnormal results are displayed) Labs Reviewed  CBC WITH DIFFERENTIAL/PLATELET - Abnormal; Notable for the following components:      Result Value   WBC 16.5 (*)    Neutro Abs 13.3 (*)    Monocytes Absolute 1.9 (*)    Abs Immature Granulocytes 0.19 (*)    All other components within normal limits  COMPREHENSIVE METABOLIC PANEL WITH GFR - Abnormal; Notable for the following components:   Sodium 133 (*)    Glucose, Bld 127 (*)    BUN 24 (*)    Creatinine, Ser 2.95 (*)    Total Protein 5.6 (*)    Albumin 3.4 (*)    GFR, Estimated 20 (*)    All other components within normal limits  URINALYSIS, W/ REFLEX TO CULTURE (INFECTION SUSPECTED) - Abnormal; Notable for the following components:   Hgb urine dipstick SMALL (*)    All other components within normal limits  I-STAT CHEM 8, ED - Abnormal; Notable for the following components:   Sodium 133 (*)    BUN 24 (*)    Creatinine, Ser 2.90 (*)    Glucose, Bld 124 (*)    TCO2 21 (*)    All other components within normal limits  TROPONIN I (HIGH SENSITIVITY) - Abnormal; Notable for the following components:   Troponin I (High Sensitivity) 22 (*)    All other components within normal limits  TROPONIN I (HIGH SENSITIVITY) - Abnormal; Notable for the following components:   Troponin I (High Sensitivity) 58 (*)    All other components within normal limits  CULTURE, BLOOD (ROUTINE X 2)  CULTURE, BLOOD (ROUTINE X 2)  URINE CULTURE  CK  I-STAT CG4 LACTIC ACID, ED    EKG EKG Interpretation Date/Time:  Wednesday June 27 2023 08:47:10 EDT Ventricular Rate:  114 PR Interval:    QRS Duration:  144 QT Interval:  368 QTC Calculation: 507 R Axis:   -16  Text Interpretation: Atrial fibrillation Ventricular premature complex Right bundle branch block Probable  lateral infarct, old Probable anteroseptal infarct, old Baseline wander in lead(s) I II aVR Confirmed by Jerilynn Montenegro [phone]) on 06/27/2023 9:03:05 AM  Radiology DG Foot Complete Left Result Date: 06/27/2023 CLINICAL DATA:  Fall and  trauma to the left foot. EXAM: LEFT FOOT - COMPLETE 3+ VIEW COMPARISON:  None Available. FINDINGS: There is no acute fracture or dislocation. The bones are osteopenic. Degenerative changes of the first MTP joint. The soft tissues are unremarkable. IMPRESSION: 1. No acute fracture or dislocation. 2. Osteopenia. Electronically Signed   By: Angus Bark M.D.   On: 06/27/2023 12:16   CT CHEST ABDOMEN PELVIS WO CONTRAST Result Date: 06/27/2023 CLINICAL DATA:  89 year old male found down this morning, hypotensive and unresponsive. Back pain. On Eliquis and Plavix . EXAM: CT CHEST, ABDOMEN AND PELVIS WITHOUT CONTRAST TECHNIQUE: Multidetector CT imaging of the chest, abdomen and pelvis was performed following the standard protocol without IV contrast. RADIATION DOSE REDUCTION: This exam was performed according to the departmental dose-optimization program which includes automated exposure control, adjustment of the mA and/or kV according to patient size and/or use of iterative reconstruction technique. COMPARISON:  Chest CT 03/03/2022. FINDINGS: CT CHEST FINDINGS Cardiovascular: Advanced Calcified aortic atherosclerosis. Mild cardiomegaly is new since last year. No pericardial effusion. Vascular patency is not evaluated in the absence of IV contrast. Advanced calcified coronary artery atherosclerosis. Mediastinum/Nodes: Chronic thyroidectomy. Noncontrast mediastinum appears negative for hematoma, lymphadenopathy, mass. Lungs/Pleura: Lower lung volumes. Major airways remain patent with atelectatic changes. Dependent atelectasis in both lungs. No pneumothorax. Positive for small right pleural effusion. Chronic superior segment right lower lobe lung nodule appears progressed since last  year, now appears to be mixed solid and sub solid, lobulated margins, 1.7 cm. See series 6, image 78. No convincing pulmonary contusion. Musculoskeletal: Thoracic vertebrae appear stable from last year, chronic T12 superior endplate compression. Visible shoulder osseous structures appear intact and aligned. No sternal fracture identified. No acute rib fracture identified. No superficial chest wall injury identified. CT ABDOMEN PELVIS FINDINGS Hepatobiliary: Noncontrast liver and gallbladder appear negative. No perihepatic fluid identified. Pancreas: Fatty atrophy. Spleen: Noncontrast spleen appears negative. No perisplenic fluid identified. Adrenals/Urinary Tract: Normal adrenal glands. Bilateral enlarged renal pelves (series 4, image 74), symmetric bilateral pararenal space inflammatory stranding which is nonspecific. Renal vascular calcifications. No definite collecting system calculus. Mild bilateral hydroureter and periureteral stranding which continues into the pelvis. In the pelvis the urinary bladder is decompressed by a Foley catheter but appears highly redundant, diffusely thick-walled, and inflamed (series 4, image 117). There is a right posterior 3.4 cm urinary bladder diverticulum which is not decompressed. Pelvic phleboliths. No definite obstructing urinary calculus. Stomach/Bowel: Decompressed large bowel from the splenic flexure distally. Extensive diverticulosis in the descending and sigmoid colon, no definite active inflammation. Decompressed transverse colon. Retained stool in the right colon. Evidence of normal retrocecal appendix on series 4, image 107. Diverticulosis in the distal small bowel. No active inflammation identified. Small volume retained fluid in the stomach. No pneumoperitoneum. Pararenal space inflammatory stranding, no convincing peritoneal space stranding or free fluid. Vascular/Lymphatic: Extensive Aortoiliac calcified atherosclerosis. Vascular patency is not evaluated in the  absence of IV contrast. Tortuous abdominal aorta without aneurysmal enlargement. No lymphadenopathy identified. There are several small chronic calcified left lower quadrant lymph nodes which appear to be postinflammatory. Reproductive: Urethral catheter in place.  Prostatomegaly. Other: Nonspecific presacral edema or stranding. Abnormal perivesical stranding, inflammation as detailed above. Musculoskeletal: L2 superior endplate compression was visible last year and appears stable. Lumbar vertebrae appear intact, stable compared to those visible last year. Sacrum, SI joints, pelvis, proximal femurs appear intact. IMPRESSION: 1. Enlarged superior segment left lower lobe lung nodule since last year, now 1.7 cm, highly suspicious for Bronchogenic Carcinoma. PET-CT and  referral to [facility]) may be the next best step in evaluation. 2. No acute traumatic injury identified in the noncontrast chest, abdomen, or pelvis. Chronic T12 and L2 compression fractures. 3. Abnormal urinary bladder appears thick-walled and inflamed, despite decompression by Foley catheter. And there is bilateral ureter and renal pelvis enlargement with symmetric perinephric space inflammation which is probably reflecting recent bilateral obstructive uropathy. Urinary infection not excluded. 4. Small right pleural effusion. Lower lung volumes with atelectasis. 5.  Aortic Atherosclerosis (ICD10-I70.0). Electronically Signed   By: Marlise Simpers M.D.   On: 06/27/2023 10:50   CT Cervical Spine Wo Contrast Result Date: 06/27/2023 CLINICAL DATA:  89 year old male found down this morning, hypotensive and unresponsive. Back pain. On Eliquis and Plavix . EXAM: CT CERVICAL SPINE WITHOUT CONTRAST TECHNIQUE: Multidetector CT imaging of the cervical spine was performed without intravenous contrast. Multiplanar CT image reconstructions were also generated. RADIATION DOSE REDUCTION: This exam was performed according to the  departmental dose-optimization program which includes automated exposure control, adjustment of the mA and/or kV according to patient size and/or use of iterative reconstruction technique. COMPARISON:  Head CT today.  Prior CTA head and neck 02/25/2023. FINDINGS: Alignment: Stable. Exaggerated upper cervical lordosis. Levoconvex cervical scoliosis. Cervicothoracic junction alignment is within normal limits. Stable posterior element alignment. Skull base and vertebrae: Visualized skull base is intact. No atlanto-occipital dissociation. C1 and C2 appear intact and aligned. No acute osseous abnormality identified. Soft tissues and spinal canal: No prevertebral fluid or swelling. No visible canal hematoma. Negative visible noncontrast neck soft tissues aside from calcified carotid atherosclerosis. Disc levels: Advanced chronic cervical spine degeneration appears stable from the CTA in February. Upper chest: Chest CT today reported separately. IMPRESSION: 1. No acute traumatic injury identified in the cervical spine. 2. Advanced chronic cervical spine degeneration appears stable from a CTA in February. 3. Chest CT today reported separately. Electronically Signed   By: Marlise Simpers M.D.   On: 06/27/2023 10:28   CT Head Wo Contrast Result Date: 06/27/2023 CLINICAL DATA:  89 year old male found down this morning, hypotensive and unresponsive. Back pain. On Eliquis and Plavix . EXAM: CT HEAD WITHOUT CONTRAST TECHNIQUE: Contiguous axial images were obtained from the base of the skull through the vertex without intravenous contrast. RADIATION DOSE REDUCTION: This exam was performed according to the departmental dose-optimization program which includes automated exposure control, adjustment of the mA and/or kV according to patient size and/or use of iterative reconstruction technique. COMPARISON:  Brain MRI 02/25/2023.  Head CT 02/25/2023. FINDINGS: Brain: Stable cerebral volume. No midline shift, ventriculomegaly, mass effect,  evidence of mass lesion, intracranial hemorrhage or evidence of cortically based acute infarction. Expected evolution, cystic encephalomalacia in the right thalamus from February lacune. Elsewhere stable gray-white differentiation. Vascular: Chronic arterial tortuosity. Calcified atherosclerosis at the skull base. Skull: Stable.  No acute osseous abnormality identified. Sinuses/Orbits: Visualized paranasal sinuses and mastoids are stable and well aerated. Other: Mild left anterior and lateral convexity asymmetric scalp soft tissue swelling suspicious for mild hematoma/contusion series 3, image 62. No scalp soft tissue gas. Underlying calvarium appears intact. Orbits soft tissues appears stable. IMPRESSION: 1. Mild left scalp soft tissue swelling suspicious for contusion/hematoma. No skull fracture identified. 2. No acute intracranial abnormality. Expected evolution of a right thalamic lacune since February. Electronically Signed   By: Marlise Simpers M.D.   On: 06/27/2023 10:22   DG Chest Portable 1 View Result Date: 06/27/2023 CLINICAL DATA:  Fall. EXAM: PORTABLE CHEST 1 VIEW COMPARISON:  10/19/2022. FINDINGS:  The heart size and mediastinal contours are within normal limits. Aortic atherosclerosis. No focal consolidation, sizeable pleural effusion, or pneumothorax. No acute osseous abnormality identified. IMPRESSION: No acute findings in the chest. Electronically Signed   By: Mannie Seek M.D.   On: 06/27/2023 09:48    Procedures .Critical Care  Performed by: Jerilynn Montenegro, MD Authorized by: Jerilynn Montenegro, MD   Critical care provider statement:    Critical care time (minutes):  35   Critical care time was exclusive of:  Separately billable procedures and treating other patients   Critical care was necessary to treat or prevent imminent or life-threatening deterioration of the following conditions:  Shock and renal failure   Critical care was time spent personally by me on the following activities:   Development of treatment plan with patient or surrogate, discussions with consultants, evaluation of patient's response to treatment, examination of patient, ordering and review of laboratory studies, ordering and review of radiographic studies, ordering and performing treatments and interventions, pulse oximetry, re-evaluation of patient's condition and review of old charts     Medications Ordered in ED Medications  lactated ringers infusion ( Intravenous New Bag/Given 06/27/23 1206)  heparin injection 5,000 Units (has no administration in time range)  sodium chloride  flush (NS) 0.9 % injection 3 mL (3 mLs Intravenous Not Given 06/27/23 1215)  acetaminophen  (TYLENOL ) tablet 650 mg (has no administration in time range)    Or  acetaminophen  (TYLENOL ) suppository 650 mg (has no administration in time range)  albuterol (PROVENTIL) (2.5 MG/3ML) 0.083% nebulizer solution 2.5 mg (has no administration in time range)  sodium chloride  0.9 % bolus 1,000 mL (0 mLs Intravenous Stopped 06/27/23 1134)  cefTRIAXone (ROCEPHIN) 1 g in sodium chloride  0.9 % 100 mL IVPB (0 g Intravenous Stopped 06/27/23 1250)    ED Course/ Medical Decision Making/ A&P                                 Medical Decision Making Amount and/or Complexity of Data Reviewed Labs: ordered.    Details: Acute kidney injury.  Leukocytosis of 16.5 Radiology: ordered and independent interpretation performed.    Details: No pneumothorax, head bleed, or obvious trauma ECG/medicine tests: ordered and independent interpretation performed.    Details: A-fib  Risk Prescription drug management. Decision regarding hospitalization.   Patient is hypotensive on arrival.  Started on a liter of fluids.  This has helped his pressure and he is no longer hypotensive.  A Foley catheter was also placed for over 900 cc of urine in the bladder.  I suspect his AKI may be partially due to decreased p.o. intake but mostly due to his obstructive uropathy.   Given the CT findings, will add on a dose of Rocephin and sent for culture even though his urinalysis is not impressive.  He is doing better from a hemodynamic standpoint and stable for medical admission.  No signs of any significant trauma on scans.  His troponin did go from 22-58, but I do not think this is ACS.  He is not having any chest pain or shortness of breath.  Discussed with Dr. Felipe Horton for admission.        Final Clinical Impression(s) / ED Diagnoses Final diagnoses:  AKI (acute kidney injury) [facility])    Rx / DC Orders ED Discharge Orders     None         Jerilynn Montenegro, MD 06/27/23 1309

## 2023-06-28 ENCOUNTER — Telehealth: Payer: Self-pay | Admitting: Pulmonary Disease

## 2023-06-28 ENCOUNTER — Inpatient Hospital Stay (HOSPITAL_COMMUNITY)

## 2023-06-28 DIAGNOSIS — R4189 Other symptoms and signs involving cognitive functions and awareness: Secondary | ICD-10-CM

## 2023-06-28 DIAGNOSIS — I9589 Other hypotension: Secondary | ICD-10-CM

## 2023-06-28 DIAGNOSIS — W19XXXA Unspecified fall, initial encounter: Secondary | ICD-10-CM | POA: Diagnosis not present

## 2023-06-28 DIAGNOSIS — R6510 Systemic inflammatory response syndrome (SIRS) of non-infectious origin without acute organ dysfunction: Secondary | ICD-10-CM | POA: Diagnosis not present

## 2023-06-28 DIAGNOSIS — N179 Acute kidney failure, unspecified: Secondary | ICD-10-CM | POA: Diagnosis not present

## 2023-06-28 DIAGNOSIS — R531 Weakness: Secondary | ICD-10-CM | POA: Diagnosis not present

## 2023-06-28 LAB — BASIC METABOLIC PANEL WITH GFR
Anion gap: 9 (ref 5–15)
BUN: 17 mg/dL (ref 8–23)
CO2: 24 mmol/L (ref 22–32)
Calcium: 9.4 mg/dL (ref 8.9–10.3)
Chloride: 105 mmol/L (ref 98–111)
Creatinine, Ser: 1.19 mg/dL (ref 0.61–1.24)
GFR, Estimated: 58 mL/min — ABNORMAL LOW (ref >60)
Glucose, Bld: 101 mg/dL — ABNORMAL HIGH (ref 70–99)
Potassium: 3.5 mmol/L (ref 3.5–5.1)
Sodium: 138 mmol/L (ref 135–145)

## 2023-06-28 LAB — ECHOCARDIOGRAM COMPLETE
AR max vel: 2.35 cm2
AV Area VTI: 2.1 cm2
AV Area mean vel: 2.28 cm2
AV Mean grad: 6 mmHg
AV Peak grad: 9.1 mmHg
Ao pk vel: 1.51 m/s
Area-P 1/2: 5.27 cm2
Height: 69 in
S' Lateral: 2.8 cm
Weight: 2962.98 [oz_av]

## 2023-06-28 LAB — URINE CULTURE: Culture: NO GROWTH

## 2023-06-28 LAB — CBC
HCT: 35.8 % — ABNORMAL LOW (ref 39.0–52.0)
Hemoglobin: 12.2 g/dL — ABNORMAL LOW (ref 13.0–17.0)
MCH: 29.8 pg (ref 26.0–34.0)
MCHC: 34.1 g/dL (ref 30.0–36.0)
MCV: 87.5 fL (ref 80.0–100.0)
Platelets: 229 10*3/uL (ref 150–400)
RBC: 4.09 MIL/uL — ABNORMAL LOW (ref 4.22–5.81)
RDW: 13.8 % (ref 11.5–15.5)
WBC: 10 10*3/uL (ref 4.0–10.5)
nRBC: 0 % (ref 0.0–0.2)

## 2023-06-28 LAB — BRAIN NATRIURETIC PEPTIDE: B Natriuretic Peptide: 556.7 pg/mL — ABNORMAL HIGH (ref 0.0–100.0)

## 2023-06-28 LAB — TROPONIN I (HIGH SENSITIVITY): Troponin I (High Sensitivity): 316 ng/L (ref <18)

## 2023-06-28 MED ORDER — CHLORHEXIDINE GLUCONATE CLOTH 2 % EX PADS
6.0000 | MEDICATED_PAD | Freq: Every day | CUTANEOUS | Status: DC
  Administered 2023-07-01 – 2023-07-09 (×9): 6 via TOPICAL

## 2023-06-28 NOTE — Progress Notes (Signed)
 PROGRESS NOTE  KAINOA SWOBODA WUJ:[id number] DOB: [birth year]   PCP: [name], MD  Patient is from: Home.  DOA: 06/27/2023 LOS: 1  Chief complaints Chief Complaint  Patient presents with   Hypotension     Brief Narrative / Interim history: 89 year old M with PMH of cognitive impairment, frequent falls, hyperparathyroidism, chronic back pain, BPH, hypercalcemia and neuropathy presenting with generalized weakness, poor p.o. intake and fall at home.  Reportedly, he was found down on the floor by his wife barely able to talk the morning of his presentation.  He has been experiencing urinary retention with dribbling and inability to urinate effectively for couple of days.  He was scheduled to see urologist before presenting to the hospital.  In ED, stable vitals.  WBC 16.5. Cr 2.9.  BUN 24.  Troponin 22.  CK1 102.  CXR and UA without significant finding.  CT head showed small left scalp soft tissue contusion without any acute intracranial abnormality.  CT chest, abdomen and pelvis noted a large superior segment of left lower lobe lung nodule since last year measuring 1.7 cm concerning for bronchogenic carcinoma, chronic T12 and L1 compression fractures and abnormal urinary bladder wall thickening despite decompression by Foley catheter.  Foley catheter inserted with return of 900 cc of urine.  Patient received IV fluid and empirically started on IV ceftriaxone.    Subjective: Seen and examined earlier this morning.  No major events overnight or this morning.  No major events overnight of this morning.  No complaints but not a great historian.  Has not great insight into why he is in the hospital.  Objective: Vitals:   06/27/23 1510 06/27/23 2001 06/28/23 0354 06/28/23 0808  BP: (!) 153/83 (!) 109/59 (!) 140/81 (!) 143/80  Pulse: (!) 101 95 76 97  Resp: 16 15 18    Temp: 97.6 F (36.4 C) 97.9 F (36.6 C) 97.8 F (36.6 C) 98.4 F (36.9 C)  TempSrc:      SpO2: 93% 94% 93% 93%   Weight:      Height:        Examination:  GENERAL: No apparent distress.  Nontoxic. HEENT: MMM.  Vision and hearing grossly intact.  NECK: Supple.  No apparent JVD.  RESP:  No IWOB.  Fair aeration bilaterally. CVS:  RRR. Heart sounds normal.  ABD/GI/GU: BS+. Abd soft, NTND.  Foley catheter in place. MSK/EXT:  Moves extremities. No apparent deformity. No edema.  SKIN: no apparent skin lesion or wound NEURO: AA.  Oriented appropriately.  No apparent focal neuro deficit. PSYCH: Calm. Normal affect.   Consultants:  None  Procedures: None  Microbiology summarized: Urine culture NGTD Blood culture NGTD  Assessment and plan: Acute kidney injury secondary to BPH with urinary retention: Resolving.  CT shows abnormal urinary bladder wall thickening despite decompression.  UA does not suggest UTI.  Urine culture NGTD. Recent Labs    08/23/22 1032 02/25/23 1420 02/25/23 1426 02/27/23 0440 06/27/23 0855 06/27/23 0908 06/28/23 0043  BUN 17 13 15 8  24* 24* 17  CREATININE 0.95 0.87 0.80 0.85 2.95* 2.90* 1.19  - Continue IV fluid - Avoid nephrotoxic meds - Continue Foley catheter until outpatient follow-up with urology  Acute urinary retention/BPH: 900 cc urine output after inserting Foley - Continue Foley catheter until outpatient follow-up with urology - Continue Flomax   Elevated troponin/BNP: Troponin trended from 22 and peaked up at 328 and trended down.  Patient denies cardiac symptoms although not a great historian.  TTE without significant  finding.  Could be delayed clearance from AKI. - Continue home Plavix  and Crestor   SIRS: Likely reactive.  UA, urine culture and blood culture NGTD.  SIRS resolved.  Sepsis ruled out.  Fall at home/head contusion/generalized weakness -Fall precaution, PT/OT  Lung nodule: CT chest showed enlarged 1.7 cm LLL nodule concerning for bronchogenic carcinoma. -Pulmonology consulted and will arrange outpatient follow-up   Hyponatremia: Na  133.  Likely hypovolemic from poor p.o. intake - Recheck in the morning   History of CVA: Had right nonhemorrhagic thalamocapsular ischemic stroke back in 02/2023.   -Continue Plavix  and statin   Postsurgical hypothyroidism: TSH normal - Continue home Synthroid    Compression fracture: Chronic T12 and L1 compression fractures noted on CT. pathology?   Cognitive impairment: -Reorientation and delirium precautions   Hyperlipidemia - Continue Crestor   Body mass index is 27.35 kg/m.           DVT prophylaxis:  heparin injection 5,000 Units Start: 06/27/23 1400  Code Status: Full code Family Communication: None at bedside Level of care: Telemetry Medical Status is: Inpatient Remains inpatient appropriate because: AKI, acute urinary tension and fall at home   Final disposition: Pending PT/OT eval   55 minutes with more than 50% spent in reviewing records, counseling patient/family and coordinating care.   Sch Meds:  Scheduled Meds:  clopidogrel   75 mg Oral Daily   gabapentin  300 mg Oral BID   heparin  5,000 Units Subcutaneous Q8H   levothyroxine   100 mcg Oral Daily   loratadine  10 mg Oral Daily   rosuvastatin   20 mg Oral Daily   sodium chloride  flush  3 mL Intravenous Q12H   tamsulosin   0.8 mg Oral Daily   Continuous Infusions: PRN Meds:.acetaminophen  **OR** acetaminophen , albuterol  Antimicrobials: Anti-infectives (From admission, onward)    Start     Dose/Rate Route Frequency Ordered Stop   06/27/23 1130  cefTRIAXone (ROCEPHIN) 1 g in sodium chloride  0.9 % 100 mL IVPB        1 g 200 mL/hr over 30 Minutes Intravenous  Once 06/27/23 1128 06/27/23 1250        I have personally reviewed the following labs and images: CBC: Recent Labs  Lab 06/27/23 0855 06/27/23 0908 06/28/23 0043  WBC 16.5*  --  10.0  NEUTROABS 13.3*  --   --   HGB 13.2 13.3 12.2*  HCT 39.0 39.0 35.8*  MCV 89.4  --  87.5  PLT 273  --  229   BMP &GFR Recent Labs  Lab  06/27/23 0855 06/27/23 0908 06/28/23 0043  NA 133* 133* 138  K 3.8 3.6 3.5  CL 100 99 105  CO2 23  --  24  GLUCOSE 127* 124* 101*  BUN 24* 24* 17  CREATININE 2.95* 2.90* 1.19  CALCIUM  10.1  --  9.4   Estimated Creatinine Clearance: 42.1 mL/min (by C-G formula based on SCr of 1.19 mg/dL). Liver & Pancreas: Recent Labs  Lab 06/27/23 0855  AST 28  ALT 16  ALKPHOS 50  BILITOT 1.0  PROT 5.6*  ALBUMIN 3.4*   No results for input(s): LIPASE, AMYLASE in the last 168 hours. No results for input(s): AMMONIA in the last 168 hours. Diabetic: No results for input(s): HGBA1C in the last 72 hours. No results for input(s): GLUCAP in the last 168 hours. Cardiac Enzymes: Recent Labs  Lab 06/27/23 0855  CKTOTAL 102   No results for input(s): PROBNP in the last 8760 hours. Coagulation Profile: No results for  input(s): INR, PROTIME in the last 168 hours. Thyroid  Function Tests: Recent Labs    06/27/23 1741  TSH 0.558   Lipid Profile: No results for input(s): CHOL, HDL, LDLCALC, TRIG, CHOLHDL, LDLDIRECT in the last 72 hours. Anemia Panel: No results for input(s): VITAMINB12, FOLATE, FERRITIN, TIBC, IRON, RETICCTPCT in the last 72 hours. Urine analysis:    Component Value Date/Time   COLORURINE YELLOW 06/27/2023 0917   APPEARANCEUR CLEAR 06/27/2023 0917   LABSPEC 1.008 06/27/2023 0917   PHURINE 6.0 06/27/2023 0917   GLUCOSEU NEGATIVE 06/27/2023 0917   HGBUR SMALL (A) 06/27/2023 0917   BILIRUBINUR NEGATIVE 06/27/2023 0917   KETONESUR NEGATIVE 06/27/2023 0917   PROTEINUR NEGATIVE 06/27/2023 0917   UROBILINOGEN 0.2 10/10/2010 1220   NITRITE NEGATIVE 06/27/2023 0917   LEUKOCYTESUR NEGATIVE 06/27/2023 0917   Sepsis Labs: Invalid input(s): PROCALCITONIN, LACTICIDVEN  Microbiology: Recent Results (from the past 240 hours)  Culture, blood (routine x 2)     Status: None (Preliminary result)   Collection Time: 06/27/23  8:55 AM    Specimen: BLOOD RIGHT ARM  Result Value Ref Range Status   Specimen Description BLOOD RIGHT ARM  Final   Special Requests   Final    BOTTLES DRAWN AEROBIC AND ANAEROBIC Blood Culture results may not be optimal due to an inadequate volume of blood received in culture bottles   Culture   Final    NO GROWTH < 24 HOURS Performed at [facility], [address], [place], [place] [address]    Report Status PENDING  Incomplete  Culture, blood (routine x 2)     Status: None (Preliminary result)   Collection Time: 06/27/23 10:08 AM   Specimen: BLOOD RIGHT HAND  Result Value Ref Range Status   Specimen Description BLOOD RIGHT HAND  Final   Special Requests   Final    AEROBIC BOTTLE ONLY Blood Culture results may not be optimal due to an inadequate volume of blood received in culture bottles   Culture   Final    NO GROWTH < 24 HOURS Performed at [facility], [address] [place], [place] [address]    Report Status PENDING  Incomplete  Urine Culture     Status: None   Collection Time: 06/27/23  1:01 PM   Specimen: Urine, Catheterized  Result Value Ref Range Status   Specimen Description URINE, CATHETERIZED  Final   Special Requests NONE  Final   Culture   Final    NO GROWTH Performed at [facility], [address], [place], [place] [address]    Report Status 06/28/2023 FINAL  Final    Radiology Studies: ECHOCARDIOGRAM COMPLETE Result Date: 06/28/2023    ECHOCARDIOGRAM REPORT   Patient Name:   STEVAN EBERWEIN Date of Exam: 06/28/2023 Medical Rec #:  [id number]      Height:       69.0 in Accession #:    [id number]     Weight:       185.2 lb Date of Birth:  March 19, 1931      BSA:          2.000 m Patient Age:    89 years       BP:           140/81 mmHg Patient Gender: M              HR:           89 bpm. Exam Location:  Inpatient Procedure: 2D  Echo, Cardiac Doppler and Color Doppler (Both Spectral and Color            Flow Doppler were utilized during procedure). Indications:     Elevated Troponin  History:        Patient has prior history of Echocardiogram examinations, most                 recent 02/25/2023. TIA; Risk Factors:Former Smoker.  Sonographer:    Astrid Blamer Referring Phys: ABIGAIL CHAVEZ IMPRESSIONS  1. Left ventricular ejection fraction, by estimation, is 65 to 70%. The left ventricle has normal function. The left ventricle has no regional wall motion abnormalities. There is moderate asymmetric left ventricular hypertrophy of the basal-septal segment. Indeterminate diastolic filling due to E-A fusion.  2. Right ventricular systolic function is normal. The right ventricular size is normal.  3. The mitral valve is grossly normal. Trivial mitral valve regurgitation. No evidence of mitral stenosis.  4. The aortic valve is tricuspid. There is moderate calcification of the aortic valve. There is mild thickening of the aortic valve. Aortic valve regurgitation is trivial. Aortic valve sclerosis/calcification is present, without any evidence of aortic stenosis.  5. The inferior vena cava is dilated in size with >50% respiratory variability, suggesting right atrial pressure of 8 mmHg. Comparison(s): No significant change from prior study. FINDINGS  Left Ventricle: Left ventricular ejection fraction, by estimation, is 65 to 70%. The left ventricle has normal function. The left ventricle has no regional wall motion abnormalities. The left ventricular internal cavity size was normal in size. There is  moderate asymmetric left ventricular hypertrophy of the basal-septal segment. Indeterminate diastolic filling due to E-A fusion. Right Ventricle: The right ventricular size is normal. Right vetricular wall thickness was not well visualized. Right ventricular systolic function is normal. Left Atrium: Left atrial size was normal in size. Right Atrium: Right atrial size was normal in size. Pericardium: There is no evidence of pericardial effusion. Mitral Valve: The mitral valve is grossly  normal. Trivial mitral valve regurgitation. No evidence of mitral valve stenosis. Tricuspid Valve: The tricuspid valve is normal in structure. Tricuspid valve regurgitation is mild . No evidence of tricuspid stenosis. Aortic Valve: The aortic valve is tricuspid. There is moderate calcification of the aortic valve. There is mild thickening of the aortic valve. Aortic valve regurgitation is trivial. Aortic valve sclerosis/calcification is present, without any evidence of aortic stenosis. Aortic valve mean gradient measures 6.0 mmHg. Aortic valve peak gradient measures 9.1 mmHg. Aortic valve area, by VTI measures 2.10 cm. Pulmonic Valve: The pulmonic valve was not well visualized. Pulmonic valve regurgitation is not visualized. Aorta: The aortic root, ascending aorta and aortic arch are all structurally normal, with no evidence of dilitation or obstruction. Venous: The inferior vena cava is dilated in size with greater than 50% respiratory variability, suggesting right atrial pressure of 8 mmHg. IAS/Shunts: The atrial septum is grossly normal.  LEFT VENTRICLE PLAX 2D LVIDd:         4.00 cm LVIDs:         2.80 cm LV PW:         0.70 cm LV IVS:        1.40 cm LVOT diam:     2.00 cm LV SV:         64 LV SV Index:   32 LVOT Area:     3.14 cm  RIGHT VENTRICLE RV S prime:     20.80 cm/s TAPSE (M-mode): 3.1 cm LEFT ATRIUM  Index        RIGHT ATRIUM           Index LA Vol (A2C):   58.1 ml 29.06 ml/m  RA Area:     22.30 cm LA Vol (A4C):   45.6 ml 22.81 ml/m  RA Volume:   62.10 ml  31.06 ml/m LA Biplane Vol: 52.3 ml 26.16 ml/m  AORTIC VALVE AV Area (Vmax):    2.35 cm AV Area (Vmean):   2.28 cm AV Area (VTI):     2.10 cm AV Vmax:           151.00 cm/s AV Vmean:          114.000 cm/s AV VTI:            0.303 m AV Peak Grad:      9.1 mmHg AV Mean Grad:      6.0 mmHg LVOT Vmax:         113.00 cm/s LVOT Vmean:        82.800 cm/s LVOT VTI:          0.203 m LVOT/AV VTI ratio: 0.67  AORTA Ao Root diam: 3.50 cm  MITRAL VALVE MV Area (PHT): 5.27 cm     SHUNTS MV Decel Time: 144 msec     Systemic VTI:  0.20 m MV E velocity: 157.00 cm/s  Systemic Diam: 2.00 cm Sheryle Donning MD Electronically signed by Sheryle Donning MD Signature Date/Time: 06/28/2023/2:38:41 PM    Final       Maleiya Pergola T. Hays [facility]igan [facility]  If 7PM-7AM, please contact night-coverage [email or web address] 06/28/2023, 2:38 PM

## 2023-06-28 NOTE — Evaluation (Signed)
 Occupational Therapy Evaluation Patient Details Name: Steve Thompson MRN: [id number] DOB: Nov 01, 1931 Today's Date: 06/28/2023   History of Present Illness   Pt is an 89 yo male who presented to [facility] ED after a fall from bed at home with unresponsiveness and noted head contusion following the fall. Pt also reporting urinary retention and not eating for a couple of days prior. Pt found to have acute urinary retention and AKI secondary to BPH. Pt also with elevated troponin and with noted growth in known lung nodule imaged on CT 6/11. PMH: CVA in 02/2023, postsurgical hypothyroidism, hypercalcemia, peripheral neuropathy, cognitive impairment, chronic back pain with chronic T12 and L1 compression fxs, HLD, SIRS     Clinical Impressions At baseline, pt is Independent with ADLs, receives assistance from wife for IADLs, and performs functional mobility Independent without an AD. Pt reports use of RW or SPC over the past week due to decreased balance. Pt also reports 4 falls since CVA in 02/2023, including fall leading to this admission and fall earlier this week outside his home. Pt now presents with decreased activity tolerance, decreased balance, increased short-term memory deficits and increased confusion per pt's wife's report, and decreased safety and independence with functional tasks. Pt currently demonstrates ability to complete UB ADLs Independent to Min assist, LB ADLs with Set up/Supervision to Min assist, and functional mobility/transfers with a RW with Contact guard to Min assist. Pt participated well during session. Pt VSS on RA throughout session and pt reports no dizziness or lightheadedness this session. Pt's wife reports she is unable to provide the physical assist pt currently needs. Pt will benefit from acute skilled OT services to address deficits outlined below and to increase safety and independence with functional tasks. Post acute discharge, pt will benefit from intensive inpatient skilled  rehab services < 3 hours per day to maximize rehab potential.      If plan is discharge home, recommend the following:   A little help with walking and/or transfers;A little help with bathing/dressing/bathroom;Assistance with cooking/housework;Assist for transportation;Help with stairs or ramp for entrance     Functional Status Assessment   Patient has had a recent decline in their functional status and demonstrates the ability to make significant improvements in function in a reasonable and predictable amount of time.     Equipment Recommendations   None recommended by OT (Pt already has needed equipment)     Recommendations for Other Services         Precautions/Restrictions   Precautions Precautions: Fall Restrictions Weight Bearing Restrictions Per Provider Order: No     Mobility Bed Mobility               General bed mobility comments: Pt sitting in recliner at beginning and end of session    Transfers Overall transfer level: Needs assistance Equipment used: Rolling walker (2 wheels) Transfers: Sit to/from Stand, Bed to chair/wheelchair/BSC Sit to Stand: Contact guard assist     Step pivot transfers: Min assist     General transfer comment: CGA and cues for safety, hand placement, and technique for STS. CGA to Min assist to maintain balance in stepping with RW.      Balance Overall balance assessment: Needs assistance Sitting-balance support: No upper extremity supported, Feet supported Sitting balance-Leahy Scale: Good     Standing balance support: Single extremity supported, During functional activity, Bilateral upper extremity supported Standing balance-Leahy Scale: Poor Standing balance comment: Pt requiring UE support to maintain balance in static and dynamic standing  and B UE support plus up to Min assist of therapist to maintain balance during ambulation.                           ADL either performed or assessed with  clinical judgement   ADL Overall ADL's : Needs assistance/impaired Eating/Feeding: Independent;Sitting   Grooming: Contact guard assist;Minimal assistance;Standing   Upper Body Bathing: Supervision/ safety;Set up;Sitting   Lower Body Bathing: Set up;Supervison/ safety;Sitting/lateral leans;Contact guard assist;Minimal assistance;Sit to/from stand Lower Body Bathing Details (indicate cue type and reason): Set up/Supervision in sitting; CGA to Min assist in standing Upper Body Dressing : Supervision/safety;Set up;Sitting   Lower Body Dressing: Supervision/safety;Set up;Sitting/lateral leans;Contact guard assist;Minimal assistance;Sit to/from stand Lower Body Dressing Details (indicate cue type and reason): Set up/Supervision in sitting; CGA to Min assist in standing Toilet Transfer: Minimal assistance;Ambulation;Rolling walker (2 wheels);Regular Toilet;Grab bars;Cueing for safety   Toileting- Clothing Manipulation and Hygiene: Supervision/safety;Set up;Sitting/lateral lean;Contact guard assist;Minimal assistance;Sit to/from stand;Cueing for safety Toileting - Clothing Manipulation Details (indicate cue type and reason): Set up/Supervision in sitting; CGA to Min assist in standing     Functional mobility during ADLs: Contact guard assist;Minimal assistance;Rolling walker (2 wheels);Cueing for safety General ADL Comments: Pt with decreased activity tolerance     Vision Baseline Vision/History: 1 Wears glasses (Pt has glasses but reports he rarely wears them) Ability to See in Adequate Light: 1 Impaired Patient Visual Report: No change from baseline Additional Comments: Pt reports he has experienced blurry vision for approximately a year. Pt also reports diplopia a few months prior to and after CVA but reports no diplopia this time. Vision appears largely [facility] for tasks assessed this session. Vision not formally screened.     Perception         Praxis         Pertinent Vitals/Pain  Pain Assessment Pain Assessment: No/denies pain     Extremity/Trunk Assessment Upper Extremity Assessment Upper Extremity Assessment: Right hand dominant;Overall [facility] for tasks assessed   Lower Extremity Assessment Lower Extremity Assessment: Defer to PT evaluation       Communication Communication Communication: Impaired Factors Affecting Communication: Hearing impaired   Cognition Arousal: Alert Behavior During Therapy: WFL for tasks assessed/performed Cognition: Cognition impaired; History of cognitive impairments     Awareness: Intellectual awareness intact, Online awareness intact Memory impairment (select all impairments): Short-term memory, Declarative long-term memory, Working memory Attention impairment (select first level of impairment): Alternating attention Executive functioning impairment (select all impairments): Organization, Problem solving OT - Cognition Comments: Pt AAOx4 and pleasant throughout session. Cognition largely [facility] for tasks assessed but with deficits noted as above. Pt with cognitive impairment at baseline, but with increased short-term memory deficits and mildly increased confusion at this time per wife's report.                 Following commands: Intact       Cueing  General Comments   Cueing Techniques: Verbal cues;Visual cues  Pt reports no dizziness or lightheadedness. Pt's wife present throughout session.   Exercises     Shoulder Instructions      Home Living Family/patient expects to be discharged to:: Private residence Living Arrangements: Spouse/significant other Available Help at Discharge: Family;Available 24 hours/day Type of Home: Other(Comment) Home Access: Stairs to enter Entrance Stairs-Number of Steps: 1 + 1 (1 onto porch and 1 into house) Entrance Stairs-Rails: None (Pt reports there is a support column on the porch that he  often holds onto) Home Layout: One level     Bathroom Shower/Tub: Scientist, research (life sciences): Standard     Home Equipment: Agricultural consultant (2 wheels);Cane - single point;Cane - quad;Rollator (4 wheels);BSC/3in1;Shower seat;Grab bars - toilet;Grab bars - tub/shower;Hand held shower head          Prior Functioning/Environment Prior Level of Function : Independent/Modified Independent;History of Falls (last six months)             Mobility Comments: Pt reports use of Rollator and SPC this week. Prior to that, he typically did not use any AD. Pt and wife report pt has had 4 falls since Feb 2025, including two this part week. One when pt fell out of bed leading to this admission and one when he was walking out of the house and stepped in the floor bed. ADLs Comments: Pt is Ind with ADLs. His wife assists with meal prep, home management tasks, and transportation. Pt's wife sets up a weekly medication planner for the pt and he manages his medication from there. Pt and his wife recently celebrated their 50th wedding anniversary.    OT Problem List: Decreased activity tolerance;Impaired balance (sitting and/or standing);Decreased safety awareness;Decreased cognition;Decreased knowledge of use of DME or AE;Decreased knowledge of precautions   OT Treatment/Interventions: Self-care/ADL training;DME and/or AE instruction;Therapeutic activities;Patient/family education;Balance training;Cognitive remediation/compensation      OT Goals(Current goals can be found in the care plan section)   Acute Rehab OT Goals Patient Stated Goal: to do whatever he needs to do to get better and return home with his wife OT Goal Formulation: With patient/family Time For Goal Achievement: 07/12/23 Potential to Achieve Goals: Good ADL Goals Pt Will Perform Grooming: with supervision;standing Pt Will Perform Lower Body Bathing: with supervision;sitting/lateral leans;sit to/from stand Pt Will Perform Lower Body Dressing: with supervision;sitting/lateral leans;sit to/from stand Pt Will  Transfer to Toilet: with supervision;ambulating;regular height toilet (with least restrictive AD) Pt Will Perform Toileting - Clothing Manipulation and hygiene: with supervision;sitting/lateral leans;sit to/from stand Additional ADL Goal #1: Patient will demonstrate understanding through teach back of education on fall prevention strategies with handout provided.   OT Frequency:  Min 2X/week    Co-evaluation              AM-PAC OT 6 Clicks Daily Activity     Outcome Measure Help from another person eating meals?: None Help from another person taking care of personal grooming?: A Little Help from another person toileting, which includes using toliet, bedpan, or urinal?: A Little Help from another person bathing (including washing, rinsing, drying)?: A Little Help from another person to put on and taking off regular upper body clothing?: A Little Help from another person to put on and taking off regular lower body clothing?: A Little 6 Click Score: 19   End of Session Equipment Utilized During Treatment: Rolling walker (2 wheels);Gait belt Nurse Communication: Mobility status  Activity Tolerance: Patient tolerated treatment well Patient left: in chair;with call bell/phone within reach;with family/visitor present  OT Visit Diagnosis: Unsteadiness on feet (R26.81);Other abnormalities of gait and mobility (R26.89);Repeated falls (R29.6);History of falling (Z91.81);Other (comment) (decreased activity tolerance)                Time: 1610-9604 OT Time Calculation (min): 42 min Charges:  OT General Charges $OT Visit: 1 Visit OT Evaluation $OT Eval Low Complexity: 1 Low OT Treatments $Self Care/Home Management : 23-37 mins  Ronnell Coins., OTR/L, MA Acute Rehab [phone]  Angelyn Barges  Emireth Cockerham 06/28/2023, 4:48 PM

## 2023-06-28 NOTE — Telephone Encounter (Signed)
 Please make next available appointment in nodule clinic for this patient Incidental nodule noted on CT imaging, also present in February 2024

## 2023-06-28 NOTE — Telephone Encounter (Signed)
 LM for PT to make appt w/Ms. Groce for Lung Nod. This per Dr. Villa Greaser.

## 2023-06-28 NOTE — Evaluation (Signed)
 Physical Therapy Evaluation Patient Details Name: Steve Thompson MRN: [id number] DOB: 1931/07/25 Today's Date: 06/28/2023  History of Present Illness  Pt is an 89 yo male who presented to [facility] ED after a fall from bed at home with unresponsiveness and noted head contusion following the fall. Pt also reporting urinary retention and not eating for a couple of days prior. Pt found to have acute urinary retention and AKI secondary to BPH. Pt also with elevated troponin and with noted growth in known lung nodule imaged on CT 6/11. PMH: CVA in 02/2023, postsurgical hypothyroidism, hypercalcemia, peripheral neuropathy, cognitive impairment, chronic back pain with chronic T12 and L1 compression fxs, HLD, SIRS  Clinical Impression  Patient presents with decreased mobility due to decreased safety awareness, decreased balance and generalized weakness.  Previously walking without device, though recent falls x 3 at home and pt declined to use walker and several attempts made to come to hospital pt refused.  Today needing up to min A for mobility in hallway with RW.   Also with still some confusion trying to remove equipment needing redirection.  HE will benefit from skilled PT in the acute setting and from post-acute inpatient rehab prior to return home.       If plan is discharge home, recommend the following: A little help with walking and/or transfers;Direct supervision/assist for medications management;Supervision due to cognitive status;Assist for transportation;Assistance with cooking/housework;A little help with bathing/dressing/bathroom;Help with stairs or ramp for entrance   Can travel by private vehicle   Yes    Equipment Recommendations None recommended by PT  Recommendations for Other Services       Functional Status Assessment Patient has had a recent decline in their functional status and demonstrates the ability to make significant improvements in function in a reasonable and predictable amount  of time.     Precautions / Restrictions Precautions Precautions: Fall Precaution/Restrictions Comments: reports multiple falls at home      Mobility  Bed Mobility               General bed mobility comments: Pt sitting in recliner at beginning and end of session    Transfers Overall transfer level: Needs assistance Equipment used: Rolling walker (2 wheels) Transfers: Sit to/from Stand Sit to Stand: Contact guard assist           General transfer comment: assist for balance    Ambulation/Gait Ambulation/Gait assistance: Contact guard assist, Min assist Gait Distance (Feet): 120 Feet Assistive device: Rolling walker (2 wheels) Gait Pattern/deviations: Step-through pattern, Decreased stride length, Trunk flexed       General Gait Details: assist and cues for turning RW trying to move to R for wide turn, encouraged turning the walker then turning his feet.  Stairs            Wheelchair Mobility     Tilt Bed    Modified Rankin (Stroke Patients Only)       Balance Overall balance assessment: Needs assistance Sitting-balance support: Feet supported Sitting balance-Leahy Scale: Good     Standing balance support: Bilateral upper extremity supported Standing balance-Leahy Scale: Poor Standing balance comment: UE support for balance                             Pertinent Vitals/Pain Pain Assessment Pain Assessment: No/denies pain    Home Living Family/patient expects to be discharged to:: Private residence Living Arrangements: Spouse/significant other Available Help at Discharge: Family;Available 24  hours/day Type of Home: Other(Comment) (townhouse) Home Access: Stairs to enter Entrance Stairs-Rails: None Entrance Stairs-Number of Steps: 1 + 1   Home Layout: One level Home Equipment: Agricultural consultant (2 wheels);Cane - single point;Cane - quad;Rollator (4 wheels);BSC/3in1;Shower seat;Grab bars - toilet;Grab bars - tub/shower;Hand held  shower head      Prior Function Prior Level of Function : Independent/Modified Independent;History of Falls (last six months)             Mobility Comments: Pt reports use of Rollator and SPC this week. Prior to that, he typically did not use any AD. Pt and wife report pt has had 4 falls since Feb 2025, including two this part week. One when pt fell out of bed leading to this admission and one when he fell out the front door per wife.  She states he would not use the walker at home ADLs Comments: Pt is Ind with ADLs. His wife assists with meal prep, home management tasks, and transportation. Pt's wife sets up a weekly medication planner for the pt and he manages his medication from there. Pt and his wife recently celebrated their 50th wedding anniversary.     Extremity/Trunk Assessment   Upper Extremity Assessment Upper Extremity Assessment: Right hand dominant;Overall [facility] for tasks assessed    Lower Extremity Assessment Lower Extremity Assessment: Generalized weakness    Cervical / Trunk Assessment Cervical / Trunk Assessment: Kyphotic  Communication   Communication Communication: Impaired Factors Affecting Communication: Hearing impaired    Cognition Arousal: Alert Behavior During Therapy: WFL for tasks assessed/performed   PT - Cognitive impairments: Memory, Safety/Judgement                       PT - Cognition Comments: oriented x 4, though trying to pull out his catheter and pulling off tape around the IV.  RN in to adjust catheter and replaced tegaderm over IV Following commands: Intact       Cueing Cueing Techniques: Verbal cues, Visual cues     General Comments General comments (skin integrity, edema, etc.): BP sitting 147/86 HR 85; standing 136/81 HR 91, standing 3 minutes 143/79 HR 90    Exercises     Assessment/Plan    PT Assessment Patient needs continued PT services  PT Problem List Decreased activity tolerance;Decreased balance;Decreased  mobility;Decreased safety awareness;Decreased cognition;Decreased knowledge of use of DME       PT Treatment Interventions DME instruction;Gait training;Patient/family education;Stair training;Functional mobility training;Therapeutic activities;Therapeutic exercise;Balance training;Cognitive remediation    PT Goals (Current goals can be found in the Care Plan section)  Acute Rehab PT Goals Patient Stated Goal: return to independent, stop falling PT Goal Formulation: With patient/family Time For Goal Achievement: 07/12/23 Potential to Achieve Goals: Fair    Frequency Min 2X/week     Co-evaluation               AM-PAC PT 6 Clicks Mobility  Outcome Measure Help needed turning from your back to your side while in a flat bed without using bedrails?: A Little Help needed moving from lying on your back to sitting on the side of a flat bed without using bedrails?: A Little Help needed moving to and from a bed to a chair (including a wheelchair)?: A Little Help needed standing up from a chair using your arms (e.g., wheelchair or bedside chair)?: A Little Help needed to walk in hospital room?: A Little Help needed climbing 3-5 steps with a railing? : Total 6  Click Score: 16    End of Session Equipment Utilized During Treatment: Gait belt Activity Tolerance: Patient tolerated treatment well Patient left: in chair;with call bell/phone within reach;with family/visitor present   PT Visit Diagnosis: Other abnormalities of gait and mobility (R26.89);History of falling (Z91.81);Repeated falls (R29.6);Other symptoms and signs involving the nervous system (R29.898)    Time: 0865-7846 PT Time Calculation (min) (ACUTE ONLY): 29 min   Charges:   PT Evaluation $PT Eval Moderate Complexity: 1 Mod PT Treatments $Gait Training: 8-22 mins PT General Charges $$ ACUTE PT VISIT: 1 Visit         Abigail Hoff, PT Acute Rehabilitation Services Office:[phone] 06/28/2023   Marley Simmers 06/28/2023, 5:20 PM

## 2023-06-29 ENCOUNTER — Ambulatory Visit: Admitting: Physical Therapy

## 2023-06-29 ENCOUNTER — Encounter: Payer: Self-pay | Admitting: Pulmonary Disease

## 2023-06-29 DIAGNOSIS — R531 Weakness: Secondary | ICD-10-CM | POA: Diagnosis not present

## 2023-06-29 DIAGNOSIS — W19XXXA Unspecified fall, initial encounter: Secondary | ICD-10-CM | POA: Diagnosis not present

## 2023-06-29 DIAGNOSIS — N179 Acute kidney failure, unspecified: Secondary | ICD-10-CM | POA: Diagnosis not present

## 2023-06-29 DIAGNOSIS — R6510 Systemic inflammatory response syndrome (SIRS) of non-infectious origin without acute organ dysfunction: Secondary | ICD-10-CM | POA: Diagnosis not present

## 2023-06-29 LAB — CBC
HCT: 39 % (ref 39.0–52.0)
Hemoglobin: 13.3 g/dL (ref 13.0–17.0)
MCH: 30 pg (ref 26.0–34.0)
MCHC: 34.1 g/dL (ref 30.0–36.0)
MCV: 87.8 fL (ref 80.0–100.0)
Platelets: 258 10*3/uL (ref 150–400)
RBC: 4.44 MIL/uL (ref 4.22–5.81)
RDW: 13.4 % (ref 11.5–15.5)
WBC: 9.3 10*3/uL (ref 4.0–10.5)
nRBC: 0 % (ref 0.0–0.2)

## 2023-06-29 LAB — RENAL FUNCTION PANEL
Albumin: 3.3 g/dL — ABNORMAL LOW (ref 3.5–5.0)
Anion gap: 12 (ref 5–15)
BUN: 7 mg/dL — ABNORMAL LOW (ref 8–23)
CO2: 26 mmol/L (ref 22–32)
Calcium: 9 mg/dL (ref 8.9–10.3)
Chloride: 101 mmol/L (ref 98–111)
Creatinine, Ser: 0.78 mg/dL (ref 0.61–1.24)
GFR, Estimated: >60 mL/min (ref >60)
Glucose, Bld: 102 mg/dL — ABNORMAL HIGH (ref 70–99)
Phosphorus: 2.7 mg/dL (ref 2.5–4.6)
Potassium: 2.9 mmol/L — ABNORMAL LOW (ref 3.5–5.1)
Sodium: 139 mmol/L (ref 135–145)

## 2023-06-29 LAB — MAGNESIUM: Magnesium: 1.5 mg/dL — ABNORMAL LOW (ref 1.7–2.4)

## 2023-06-29 MED ORDER — MAGNESIUM SULFATE 2 GM/50ML IV SOLN
2.0000 g | Freq: Once | INTRAVENOUS | Status: AC
  Filled 2023-06-29: qty 50

## 2023-06-29 MED ORDER — POTASSIUM CHLORIDE CRYS ER 20 MEQ PO TBCR
40.0000 meq | EXTENDED_RELEASE_TABLET | ORAL | Status: AC
  Filled 2023-06-29 (×3): qty 2

## 2023-06-29 NOTE — NC FL2 (Signed)
 [place]  MEDICAID FL2 LEVEL OF CARE FORM     IDENTIFICATION  Patient Name: Steve Thompson Birthdate: [birth year] Sex: male Admission Date (Current Location): 06/27/2023  [facility] and [place] Number:  Producer, television/film/video and Address:  [facility] [place], [address], [place], [place] [address]      Provider Number:    Attending Physician Name and Address:  Theadore Finger, MD  Relative Name and Phone Number:  R[place]o, [place] [phone]    Current Level of Care: Hospital Recommended Level of Care: Skilled Nursing Facility Prior Approval Number:    Date Approved/Denied:   PASRR Number:    Discharge Plan: SNF    Current Diagnoses: Patient Active Problem List   Diagnosis Date Noted   Acute kidney injury superimposed on chronic kidney disease (HCC) 06/27/2023   Head contusion 06/27/2023   SIRS (systemic inflammatory response syndrome) (HCC) 06/27/2023   Fall at home, initial encounter 06/27/2023   History of CVA (cerebrovascular accident) 06/27/2023   Generalized weakness 06/27/2023   Lung nodule 06/27/2023   Compression fracture of body of thoracic vertebra (HCC) 06/27/2023   Hyperlipidemia 06/27/2023   TIA (transient ischemic attack) 02/25/2023   Diplopia 01/31/2022   Paresthesia 01/31/2022   Cognitive impairment 01/31/2022   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   Hypothyroidism, postsurgical 12/28/2010   Thyroiditis, lymphocytic 11/09/2010   Hyperparathyroidism, primary - recurrent 08/17/2010    Orientation RESPIRATION BLADDER Height & Weight     Self, Time, Situation, Place  Normal Incontinent, Indwelling catheter Weight: 185 lb 3 oz (84 kg) Height:  5' 9 (175.3 cm)  BEHAVIORAL SYMPTOMS/MOOD NEUROLOGICAL BOWEL NUTRITION STATUS      Incontinent Diet (see discharge summary)  AMBULATORY STATUS COMMUNICATION OF NEEDS Skin   Limited Assist Verbally Other (Comment), Skin abrasions (ecchymosis)                        Personal Care Assistance Level of Assistance  Bathing, Feeding, Dressing Bathing Assistance: Limited assistance Feeding assistance: Independent Dressing Assistance: Limited assistance     Functional Limitations Info  Sight, Hearing, Speech Sight Info: Adequate Hearing Info: Adequate Speech Info: Adequate    SPECIAL CARE FACTORS FREQUENCY  PT (By licensed PT), OT (By licensed OT)     PT Frequency: 5x week OT Frequency: 5x wel            Contractures Contractures Info: Not present    Additional Factors Info  Code Status, Allergies Code Status Info: full Allergies Info: NKA           Current Medications (06/29/2023):  This is the current hospital active medication list Current Facility-Administered Medications  Medication Dose Route Frequency Provider Last Rate Last Admin   acetaminophen  (TYLENOL ) tablet 650 mg  650 mg Oral Q6H PRN Smith, Rondell A, MD       Or   acetaminophen  (TYLENOL ) suppository 650 mg  650 mg Rectal Q6H PRN Smith, Rondell A, MD       albuterol  (PROVENTIL ) (2.5 MG/3ML) 0.083% nebulizer solution 2.5 mg  2.5 mg Nebulization Q6H PRN Felipe Horton, Rondell A, MD       Chlorhexidine  Gluconate Cloth 2 % PADS 6 each  6 each Topical Daily Gonfa, Taye T, MD   6 each at 06/29/23 0853   clopidogrel  (PLAVIX ) tablet 75 mg  75 mg Oral Daily Smith, Rondell A, MD   75 mg at 06/29/23 0852   gabapentin  (NEURONTIN ) capsule 300 mg  300  mg Oral BID Smith, Rondell A, MD   300 mg at 06/29/23 0852   heparin  injection 5,000 Units  5,000 Units Subcutaneous Q8H Smith, Rondell A, MD   5,000 Units at 06/29/23 1403   levothyroxine  (SYNTHROID ) tablet 100 mcg  100 mcg Oral Daily Smith, Rondell A, MD   100 mcg at 06/29/23 0458   loratadine  (CLARITIN ) tablet 10 mg  10 mg Oral Daily Smith, Rondell A, MD   10 mg at 06/29/23 0852   potassium chloride  SA (KLOR-CON  M) CR tablet 40 mEq  40 mEq Oral Q3H Gonfa, Taye T, MD   40 mEq at 06/29/23 1446   rosuvastatin  (CRESTOR ) tablet 20 mg  20 mg Oral  Daily Smith, Rondell A, MD   20 mg at 06/29/23 0852   sodium chloride  flush (NS) 0.9 % injection 3 mL  3 mL Intravenous Q12H Smith, Rondell A, MD   3 mL at 06/29/23 0855   tamsulosin  (FLOMAX ) capsule 0.8 mg  0.8 mg Oral Daily Smith, Rondell A, MD   0.8 mg at 06/29/23 8295     Discharge Medications: Please see discharge summary for a list of discharge medications.  Relevant Imaging Results:  Relevant Lab Results:   Additional Information SSN: [id number]  Gloria Lares Merrie Abed, LCSW

## 2023-06-29 NOTE — Progress Notes (Signed)
 PROGRESS NOTE  RENDON HOWELL ZOX:[id number] DOB: [birth year]   PCP: [name], MD  Patient is from: Home.  DOA: 06/27/2023 LOS: 2  Chief complaints Chief Complaint  Patient presents with   Hypotension     Brief Narrative / Interim history: 89 year old M with PMH of cognitive impairment, myasthenia gravis, frequent falls, hyperparathyroidism, chronic back pain, BPH, hypercalcemia and neuropathy presenting with generalized weakness, poor p.o. intake and fall at home.  Reportedly, he was found down on the floor by his wife barely able to talk the morning of his presentation.  He has been experiencing urinary retention with dribbling and inability to urinate effectively for couple of days.  He was scheduled to see urologist before presenting to the hospital.  In ED, stable vitals.  WBC 16.5. Cr 2.9.  BUN 24.  Troponin 22.  CK1 102.  CXR and UA without significant finding.  CT head showed small left scalp soft tissue contusion without any acute intracranial abnormality.  CT chest, abdomen and pelvis noted a large superior segment of left lower lobe lung nodule since last year measuring 1.7 cm concerning for bronchogenic carcinoma, chronic T12 and L1 compression fractures and abnormal urinary bladder wall thickening despite decompression by Foley catheter.  Foley catheter inserted with return of 900 cc of urine.  Patient received IV fluid and empirically started on IV ceftriaxone .   AKI resolved.  TTE with LVEF of 65 to 70%, moderate asymmetric LVH, indeterminate DD.  Therapy recommended SNF.  Medically stable for discharge once elect lites corrected.   Subjective: Seen and examined earlier this morning.  No major events overnight or this morning.  No major events overnight of this morning.  No complaints.  Eager to go home but understands the need for rehab/SNF.  Objective: Vitals:   06/28/23 1521 06/28/23 1955 06/29/23 0446 06/29/23 0741  BP: 114/67 (!) 164/95 (!) 155/95 (!) 157/106   Pulse: 85 (!) 46 79 93  Resp: 17 18 17 16   Temp: 98.1 F (36.7 C) 98.1 F (36.7 C) 98 F (36.7 C) 98.2 F (36.8 C)  TempSrc:   Oral Oral  SpO2: 96% 94% 94% 90%  Weight:      Height:        Examination:  GENERAL: No apparent distress.  Nontoxic. HEENT: MMM.  Vision and hearing grossly intact.  NECK: Supple.  No apparent JVD.  RESP:  No IWOB.  Fair aeration bilaterally. CVS:  RRR. Heart sounds normal.  ABD/GI/GU: BS+. Abd soft, NTND.  Foley catheter in place. MSK/EXT:  Moves extremities. No apparent deformity. No edema.  SKIN: no apparent skin lesion or wound NEURO: AA.  Oriented x 4 except date.  No apparent focal neuro deficit. PSYCH: Calm. Normal affect.   Consultants:  None  Procedures: None  Microbiology summarized: Urine culture NGTD Blood culture NGTD  Assessment and plan: Acute kidney injury secondary to BPH with urinary retention: Resolving.  CT shows abnormal urinary bladder wall thickening despite decompression.  UA does not suggest UTI.  Urine culture NGTD. Recent Labs    08/23/22 1032 02/25/23 1420 02/25/23 1426 02/27/23 0440 06/27/23 0855 06/27/23 0908 06/28/23 0043 06/29/23 0639  BUN 17 13 15 8  24* 24* 17 7*  CREATININE 0.95 0.87 0.80 0.85 2.95* 2.90* 1.19 0.78  - Monitor off IV fluid. - Avoid nephrotoxic meds - Continue Foley catheter until outpatient follow-up with urology  Acute urinary retention/BPH: 900 cc urine output after inserting Foley - Continue Foley catheter until outpatient follow-up with urology -  Continue Flomax   Elevated troponin/BNP: Troponin trended from 22 and peaked up at 328 and trended down.  Patient denies cardiac symptoms although not a great historian.  TTE without significant finding.  Could be delayed clearance from AKI. -Continue home Plavix  and Crestor   SIRS: Likely reactive.  UA, urine culture and blood culture NGTD.  SIRS resolved.  Sepsis ruled out.  Fall at home/head contusion/generalized weakness -Fall  precaution, PT/OT  Lung nodule: CT chest showed enlarged 1.7 cm LLL nodule concerning for bronchogenic carcinoma. -Pulmonology consulted and will arrange outpatient follow-up   Hyponatremia: Na 133.  Likely hypovolemic from poor p.o. intake - Recheck in the morning   History of CVA: Had right nonhemorrhagic thalamocapsular ischemic stroke back in 02/2023.   -Continue Plavix  and statin   Postsurgical hypothyroidism: TSH normal - Continue home Synthroid    Compression fracture: Chronic T12 and L1 compression fractures noted on CT. pathology?   Cognitive impairment: -Reorientation and delirium precautions  History of ocular myasthenia gravis: Followed by neurology.  Per last neurology note in 02/2023, supposed to be on Mestinon  30 mg 3 times daily as needed.  Stable.   Hyperlipidemia - Continue Crestor   Hypokalemia/hypomagnesemia - Monitor replenish as appropriate  Body mass index is 27.35 kg/m.           DVT prophylaxis:  heparin  injection 5,000 Units Start: 06/27/23 1400  Code Status: Full code Family Communication: None at bedside Level of care: Telemetry Medical Status is: Inpatient Remains inpatient appropriate because: AKI, acute urinary tension and fall at home   Final disposition: SNF.   35 minutes with more than 50% spent in reviewing records, counseling patient/family and coordinating care.   Sch Meds:  Scheduled Meds:  Chlorhexidine  Gluconate Cloth  6 each Topical Daily   clopidogrel   75 mg Oral Daily   gabapentin   300 mg Oral BID   heparin   5,000 Units Subcutaneous Q8H   levothyroxine   100 mcg Oral Daily   loratadine   10 mg Oral Daily   potassium chloride   40 mEq Oral Q3H   rosuvastatin   20 mg Oral Daily   sodium chloride  flush  3 mL Intravenous Q12H   tamsulosin   0.8 mg Oral Daily   Continuous Infusions: PRN Meds:.acetaminophen  **OR** acetaminophen , albuterol   Antimicrobials: Anti-infectives (From admission, onward)    Start     Dose/Rate  Route Frequency Ordered Stop   06/27/23 1130  cefTRIAXone  (ROCEPHIN ) 1 g in sodium chloride  0.9 % 100 mL IVPB        1 g 200 mL/hr over 30 Minutes Intravenous  Once 06/27/23 1128 06/27/23 1250        I have personally reviewed the following labs and images: CBC: Recent Labs  Lab 06/27/23 0855 06/27/23 0908 06/28/23 0043 06/29/23 0639  WBC 16.5*  --  10.0 9.3  NEUTROABS 13.3*  --   --   --   HGB 13.2 13.3 12.2* 13.3  HCT 39.0 39.0 35.8* 39.0  MCV 89.4  --  87.5 87.8  PLT 273  --  229 258   BMP &GFR Recent Labs  Lab 06/27/23 0855 06/27/23 0908 06/28/23 0043 06/29/23 0639  NA 133* 133* 138 139  K 3.8 3.6 3.5 2.9*  CL 100 99 105 101  CO2 23  --  24 26  GLUCOSE 127* 124* 101* 102*  BUN 24* 24* 17 7*  CREATININE 2.95* 2.90* 1.19 0.78  CALCIUM  10.1  --  9.4 9.0  MG  --   --   --  1.5*  PHOS  --   --   --  2.7   Estimated Creatinine Clearance: 62.6 mL/min (by C-G formula based on SCr of 0.78 mg/dL). Liver & Pancreas: Recent Labs  Lab 06/27/23 0855 06/29/23 0639  AST 28  --   ALT 16  --   ALKPHOS 50  --   BILITOT 1.0  --   PROT 5.6*  --   ALBUMIN 3.4* 3.3*   No results for input(s): LIPASE, AMYLASE in the last 168 hours. No results for input(s): AMMONIA in the last 168 hours. Diabetic: No results for input(s): HGBA1C in the last 72 hours. No results for input(s): GLUCAP in the last 168 hours. Cardiac Enzymes: Recent Labs  Lab 06/27/23 0855  CKTOTAL 102   No results for input(s): PROBNP in the last 8760 hours. Coagulation Profile: No results for input(s): INR, PROTIME in the last 168 hours. Thyroid  Function Tests: Recent Labs    06/27/23 1741  TSH 0.558   Lipid Profile: No results for input(s): CHOL, HDL, LDLCALC, TRIG, CHOLHDL, LDLDIRECT in the last 72 hours. Anemia Panel: No results for input(s): VITAMINB12, FOLATE, FERRITIN, TIBC, IRON, RETICCTPCT in the last 72 hours. Urine analysis:    Component Value  Date/Time   COLORURINE YELLOW 06/27/2023 0917   APPEARANCEUR CLEAR 06/27/2023 0917   LABSPEC 1.008 06/27/2023 0917   PHURINE 6.0 06/27/2023 0917   GLUCOSEU NEGATIVE 06/27/2023 0917   HGBUR SMALL (A) 06/27/2023 0917   BILIRUBINUR NEGATIVE 06/27/2023 0917   KETONESUR NEGATIVE 06/27/2023 0917   PROTEINUR NEGATIVE 06/27/2023 0917   UROBILINOGEN 0.2 10/10/2010 1220   NITRITE NEGATIVE 06/27/2023 0917   LEUKOCYTESUR NEGATIVE 06/27/2023 0917   Sepsis Labs: Invalid input(s): PROCALCITONIN, LACTICIDVEN  Microbiology: Recent Results (from the past 240 hours)  Culture, blood (routine x 2)     Status: None (Preliminary result)   Collection Time: 06/27/23  8:55 AM   Specimen: BLOOD RIGHT ARM  Result Value Ref Range Status   Specimen Description BLOOD RIGHT ARM  Final   Special Requests   Final    BOTTLES DRAWN AEROBIC AND ANAEROBIC Blood Culture results may not be optimal due to an inadequate volume of blood received in culture bottles   Culture   Final    NO GROWTH 2 DAYS Performed at [facility], [address] [place], [place] [address]    Report Status PENDING  Incomplete  Culture, blood (routine x 2)     Status: None (Preliminary result)   Collection Time: 06/27/23 10:08 AM   Specimen: BLOOD RIGHT HAND  Result Value Ref Range Status   Specimen Description BLOOD RIGHT HAND  Final   Special Requests   Final    AEROBIC BOTTLE ONLY Blood Culture results may not be optimal due to an inadequate volume of blood received in culture bottles   Culture   Final    NO GROWTH 2 DAYS Performed at [facility], [address] [place], [place] [address]    Report Status PENDING  Incomplete  Urine Culture     Status: None   Collection Time: 06/27/23  1:01 PM   Specimen: Urine, Catheterized  Result Value Ref Range Status   Specimen Description URINE, CATHETERIZED  Final   Special Requests NONE  Final   Culture   Final    NO GROWTH Performed at [facility], [address], [place], [place] [address]    Report Status 06/28/2023 FINAL  Final    Radiology  Studies: No results found.     Vinnie Gombert T. Anterio Scheel [facility]  If 7PM-7AM, please contact night-coverage [email or web address] 06/29/2023, 2:47 PM

## 2023-06-29 NOTE — Telephone Encounter (Signed)
 NFN

## 2023-06-29 NOTE — Progress Notes (Signed)
 Mobility Specialist Progress Note:    06/29/23 1000  Mobility  Activity Ambulated with assistance in hallway  Level of Assistance Contact guard assist, steadying assist  Assistive Device Front wheel walker  Distance Ambulated (ft) 150 ft  Activity Response Tolerated well  Mobility Referral Yes  Mobility visit 1 Mobility  Mobility Specialist Start Time (ACUTE ONLY) 0933  Mobility Specialist Stop Time (ACUTE ONLY) 0948  Mobility Specialist Time Calculation (min) (ACUTE ONLY) 15 min   Received pt in chair having no complaints and agreeable to mobility. Pt was asymptomatic throughout ambulation and returned to room w/o fault. Left in chair w/ call bell in reach and all needs met. Chair alarm on.   D'Vante Nolon Baxter Mobility Specialist Please contact via Special educational needs teacher or Rehab office at [phone]

## 2023-06-29 NOTE — Plan of Care (Signed)
  Problem: Clinical Measurements: Goal: Respiratory complications will improve Outcome: Progressing   Problem: Activity: Goal: Risk for activity intolerance will decrease Outcome: Progressing   Problem: Safety: Goal: Ability to remain free from injury will improve Outcome: Progressing   Problem: Skin Integrity: Goal: Risk for impaired skin integrity will decrease Outcome: Progressing

## 2023-06-29 NOTE — Telephone Encounter (Signed)
 PT is in hospital and will go straight to rehab once released. Will send postal letter asking him to make appt once avail.

## 2023-06-29 NOTE — TOC Initial Note (Signed)
 Transition of Care [facility]) - Initial/Assessment Note    Patient Details  Name: Steve Thompson MRN: [id number] Date of Birth: 02/10/1931  Transition of [facility]) CM/SW Contact:    Elspeth Hals, LCSW Phone Number: 06/29/2023, 3:29 PM  Clinical Narrative:  CSW met with pt and wife Nellie Banas regarding PT recommendation for SNF.  They are agreeable to this, medicare choice document provided, permission given to send out referral in the hub.  Pt from home with wife, no current services.  Permission given to speak with wife, son Donavon Fudge, daughter Alvia Awkward.  Referral sent out in hub for SNF. PASSR requires additional info.                  Expected Discharge Plan: Skilled Nursing Facility Barriers to Discharge: Continued Medical Work up, SNF Pending bed offer   Patient Goals and CMS Choice Patient states their goals for this hospitalization and ongoing recovery are:: back to normal CMS Medicare.gov Compare Post Acute Care list provided to:: Patient Represenative (must comment) (wife Nellie Banas) Choice offered to / list presented to : Spouse, Patient      Expected Discharge Plan and Services In-house Referral: Clinical Social Work   Post Acute Care Choice: Skilled Nursing Facility Living arrangements for the past 2 months: Single Family Home                                      Prior Living Arrangements/Services Living arrangements for the past 2 months: Single Family Home Lives with:: Spouse Patient language and need for interpreter reviewed:: Yes Do you feel safe going back to the place where you live?: Yes      Need for Family Participation in Patient Care: Yes (Comment) Care giver support system in place?: Yes (comment) Current home services: Other (comment) (no current services) Criminal Activity/Legal Involvement Pertinent to Current Situation/Hospitalization: No - Comment as needed  Activities of Daily Living      Permission Sought/Granted Permission sought to  share information with : Family Supports Permission granted to share information with : Yes, Verbal Permission Granted  Share Information with NAME: wife Nellie Banas, children: Cal, Donavon Fudge  Permission granted to share info w AGENCY: SNF        Emotional Assessment Appearance:: Appears stated age Attitude/Demeanor/Rapport: Engaged Affect (typically observed): Appropriate, Pleasant Orientation: : Oriented to Self, Oriented to Place, Oriented to  Time, Oriented to Situation      Admission diagnosis:  AKI (acute kidney injury) (HCC) [N17.9] Acute kidney injury superimposed on chronic kidney disease (HCC) [N17.9, N18.9] Patient Active Problem List   Diagnosis Date Noted   Acute kidney injury superimposed on chronic kidney disease (HCC) 06/27/2023   Head contusion 06/27/2023   SIRS (systemic inflammatory response syndrome) (HCC) 06/27/2023   Fall at home, initial encounter 06/27/2023   History of CVA (cerebrovascular accident) 06/27/2023   Generalized weakness 06/27/2023   Lung nodule 06/27/2023   Compression fracture of body of thoracic vertebra (HCC) 06/27/2023   Hyperlipidemia 06/27/2023   TIA (transient ischemic attack) 02/25/2023   Diplopia 01/31/2022   Paresthesia 01/31/2022   Cognitive impairment 01/31/2022   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   Hypothyroidism, postsurgical 12/28/2010   Thyroiditis, 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 [place] [address] Phone: [phone] Fax: [phone]     Social Drivers of Health (SDOH) Social History: SDOH Screenings   Food Insecurity: Patient Declined (06/28/2023)  Housing: Low Risk  (06/28/2023)  Transportation Needs: No Transportation Needs  (06/28/2023)  Utilities: Patient Declined (06/28/2023)  Social Connections: Unknown (06/28/2023)  Tobacco Use: Medium Risk (06/27/2023)   SDOH Interventions:     Readmission Risk Interventions     No data to display

## 2023-06-29 NOTE — Progress Notes (Signed)
 RE:  Steve Thompson       Date of Birth: Nov 25, 2031      Date:   06/29/23       To Whom It May Concern:  Please be advised that the above-named patient will require a short-term nursing home stay - anticipated 30 days or less for rehabilitation and strengthening.  The plan is for return home.                 MD signature                Date

## 2023-06-29 NOTE — Progress Notes (Signed)
 Mobility Specialist Progress Note:   06/29/23 1400  Mobility  Activity Ambulated with assistance to bathroom  Level of Assistance Contact guard assist, steadying assist  Assistive Device Front wheel walker  Distance Ambulated (ft) 10 ft  Activity Response Tolerated well  Mobility Referral Yes  Mobility visit 1 Mobility  Mobility Specialist Start Time (ACUTE ONLY) 1425  Mobility Specialist Stop Time (ACUTE ONLY) 1430  Mobility Specialist Time Calculation (min) (ACUTE ONLY) 5 min   Pt received in bed, requesting assistance to BR. Only requiring contact guard throughout w/ no complaints. Pt left in BR with instructions to pull call string. NT aware.  D'Vante Nolon Baxter Mobility Specialist Please contact via Special educational needs teacher or Rehab office at [phone]

## 2023-06-30 DIAGNOSIS — R531 Weakness: Secondary | ICD-10-CM | POA: Diagnosis not present

## 2023-06-30 DIAGNOSIS — R6510 Systemic inflammatory response syndrome (SIRS) of non-infectious origin without acute organ dysfunction: Secondary | ICD-10-CM | POA: Diagnosis not present

## 2023-06-30 DIAGNOSIS — N179 Acute kidney failure, unspecified: Secondary | ICD-10-CM | POA: Diagnosis not present

## 2023-06-30 DIAGNOSIS — R4189 Other symptoms and signs involving cognitive functions and awareness: Secondary | ICD-10-CM | POA: Diagnosis not present

## 2023-06-30 LAB — RENAL FUNCTION PANEL
Albumin: 3.2 g/dL — ABNORMAL LOW (ref 3.5–5.0)
Anion gap: 9 (ref 5–15)
BUN: 8 mg/dL (ref 8–23)
CO2: 25 mmol/L (ref 22–32)
Calcium: 9 mg/dL (ref 8.9–10.3)
Chloride: 102 mmol/L (ref 98–111)
Creatinine, Ser: 0.72 mg/dL (ref 0.61–1.24)
GFR, Estimated: >60 mL/min (ref >60)
Glucose, Bld: 128 mg/dL — ABNORMAL HIGH (ref 70–99)
Phosphorus: 2.8 mg/dL (ref 2.5–4.6)
Potassium: 3.8 mmol/L (ref 3.5–5.1)
Sodium: 136 mmol/L (ref 135–145)

## 2023-06-30 LAB — MAGNESIUM: Magnesium: 1.7 mg/dL (ref 1.7–2.4)

## 2023-06-30 MED ORDER — GABAPENTIN 300 MG PO CAPS: ORAL_CAPSULE | ORAL | Status: DC

## 2023-06-30 NOTE — Progress Notes (Signed)
 Patient got out of the bed and was going to the toilet despite being instructed to call  for help.The patient had a foley and as he was moving to the bathroom the bag was still attached to the bed frame which made him pull on the foley.There was some bleeding from the penile region however the foley was still intact.Patient was cleaned and Doctor informed who said this nurse should monitor for continued bleeding and report if bleeding continues.

## 2023-06-30 NOTE — Plan of Care (Signed)

## 2023-06-30 NOTE — TOC Progression Note (Addendum)
 Transition of Care [facility]) - Progression Note    Patient Details  Name: Steve Thompson MRN: [id number] Date of Birth: Jul 28, 1931  Transition [facility] Care [facility]) CM/SW Contact  Ndia Sampath [place], [place] Phone Number: 06/30/2023, 4:10 PM  Clinical Narrative:     Required clinicals uploaded to [place] MUST to obtain PASRR.  4:30pm Phone call place to patient's spouse to review bed offers. Per RN, spouse Neurosurgeon, [facility] or  [place] as preferred choice.  Transitions of Care to continue to follow for discharge planning.  Ezariah Nace, LCSW Transition of Care  Expected Discharge Plan: Skilled Nursing Facility Barriers to Discharge: Continued Medical Work up, SNF Pending bed offer  Expected Discharge Plan and Services In-house Referral: Clinical Social Work   Post Acute Care Choice: Skilled Nursing Facility Living arrangements for the past 2 months: Single Family Home Expected Discharge Date: 06/30/23                                     Social Determinants of Health (SDOH) Interventions SDOH Screenings   Food Insecurity: Patient Declined (06/28/2023)  Housing: Low Risk  (06/28/2023)  Transportation Needs: No Transportation Needs (06/28/2023)  Utilities: Patient Declined (06/28/2023)  Social Connections: Unknown (06/28/2023)  Tobacco Use: Medium Risk (06/27/2023)    Readmission Risk Interventions     No data to display

## 2023-06-30 NOTE — Plan of Care (Signed)
  Problem: Activity: Goal: Risk for activity intolerance will decrease Outcome: Progressing   Problem: Safety: Goal: Ability to remain free from injury will improve Outcome: Progressing   

## 2023-06-30 NOTE — Discharge Summary (Signed)
 Physician Discharge Summary  Steve Thompson VOZ:[id number] DOB: 1932/01/04 DOA: 06/27/2023  PCP: [name], MD  Admit date: 06/27/2023 Discharge date: 06/30/23  Admitted From: Home Disposition: SNF Recommendations for Outpatient Follow-up:  Outpatient follow-up with urology for voiding trial Pulmonology to arrange outpatient follow-up for pulmonary nodule Check CMP and CBC in 1 week Please follow up on the following pending results: None   Discharge Condition: Stable CODE STATUS: Full code   Hospital course 89 year old M with PMH of cognitive impairment, myasthenia gravis, frequent falls, hyperparathyroidism, chronic back pain, BPH, hypercalcemia and neuropathy presenting with generalized weakness, poor p.o. intake and fall at home.  Reportedly, he was found down on the floor by his wife barely able to talk the morning of his presentation.  He has been experiencing urinary retention with dribbling and inability to urinate effectively for couple of days.  He was scheduled to see urologist before presenting to the hospital.   In ED, stable vitals.  WBC 16.5. Cr 2.9.  BUN 24.  Troponin 22.  CK1 102.  CXR and UA without significant finding.  CT head showed small left scalp soft tissue contusion without any acute intracranial abnormality.  CT chest, abdomen and pelvis noted a large superior segment of left lower lobe lung nodule since last year measuring 1.7 cm concerning for bronchogenic carcinoma, chronic T12 and L1 compression fractures and abnormal urinary bladder wall thickening despite decompression by Foley catheter.  Foley catheter inserted with return of 900 cc of urine.  Patient received IV fluid and empirically started on IV ceftriaxone .   Sepsis ruled out.  Antibiotics discontinued.  AKI resolved.  Patient is discharged with Foley catheter until outpatient follow-up with urology for voiding trial and further evaluation.   TTE with LVEF of 65 to 70%, moderate asymmetric  LVH, indeterminate DD.  Therapy recommended SNF.   See individual problem list below for more.   Problems addressed during this hospitalization Acute kidney injury secondary to BPH with urinary retention: Resolving.  CT shows abnormal urinary bladder wall thickening despite decompression.  UA does not suggest UTI.  Urine culture NGTD. Recent Labs    08/23/22 1032 02/25/23 1420 02/25/23 1426 02/27/23 0440 06/27/23 0855 06/27/23 0908 06/28/23 0043 06/29/23 0639 06/30/23 1009  BUN 17 13 15 8  24* 24* 17 7* 8  CREATININE 0.95 0.87 0.80 0.85 2.95* 2.90* 1.19 0.78 0.72  - Continue Foley catheter until outpatient follow-up with urology   Acute urinary retention/BPH: 900 cc urine output after inserting Foley - Continue Foley catheter until outpatient follow-up with urology - Continue Flomax    Elevated troponin/BNP: Troponin trended from 22 and peaked up at 328 and trended down.  Patient denies cardiac symptoms although not a great historian.  TTE without significant finding.  Could be delayed clearance from AKI. -Continue home Plavix  and Crestor    SIRS: Likely reactive.  UA, urine culture and blood culture NGTD.  SIRS resolved.  Sepsis ruled out.   Fall at home/head contusion/generalized weakness -Fall precaution, PT/OT   Lung nodule: CT chest showed enlarged 1.7 cm LLL nodule concerning for bronchogenic carcinoma. -Pulmonology consulted and will arrange outpatient follow-up   Hyponatremia:Likely hypovolemic from poor p.o. intake.  Resolved.   History of CVA: Had right nonhemorrhagic thalamocapsular ischemic stroke back in 02/2023.   -Continue Plavix  and statin   Postsurgical hypothyroidism: TSH normal - Continue home Synthroid    Compression fracture: Chronic T12 and L1 compression fractures noted on CT. pathology?   Cognitive impairment: -Reorientation and delirium  precautions   History of ocular myasthenia gravis: Followed by neurology.  Per last neurology note in 02/2023,  supposed to be on Mestinon  30 mg 3 times daily as needed.  Stable. -Outpatient follow-up with neurology -Avoid medication that could increase the risk of myasthenia crisis   Hyperlipidemia - Continue Crestor    Hypokalemia/hypomagnesemia: Resolved            Time spent 35 minutes  Vital signs Vitals:   06/29/23 1451 06/29/23 1922 06/30/23 0506 06/30/23 0748  BP: 138/76 129/80 132/76 (!) 147/83  Pulse: 95 95 90 86  Temp: (!) 97.3 F (36.3 C) 98.5 F (36.9 C) 98.6 F (37 C) 98.1 F (36.7 C)  Resp: 16 18 15 18   Height:      Weight:      SpO2: 94% 92% 93% 93%  TempSrc: Oral   Oral  BMI (Calculated):         Discharge exam  GENERAL: No apparent distress.  Nontoxic. HEENT: MMM.  Vision and hearing grossly intact.  NECK: Supple.  No apparent JVD.  RESP:  No IWOB.  Fair aeration bilaterally. CVS:  RRR. Heart sounds normal.  ABD/GI/GU: BS+. Abd soft, NTND.  Foley catheter in place. MSK/EXT:  Moves extremities. No apparent deformity. No edema.  SKIN: no apparent skin lesion or wound NEURO: AA.  Oriented x 4 except date.  No apparent focal neuro deficit. PSYCH: Calm. Normal affect.   Discharge Instructions Discharge Instructions     Diet general   Complete by: As directed    Increase activity slowly   Complete by: As directed       Allergies as of 06/30/2023   No Known Allergies      Medication List     TAKE these medications    clopidogrel  75 MG tablet Commonly known as: PLAVIX  Take 1 tablet (75 mg total) by mouth daily.   gabapentin  300 MG capsule Commonly known as: NEURONTIN  Take 1 capsule (300 mg total) by mouth in the morning AND 2 capsules (600 mg total) at bedtime. What changed: See the new instructions.   levothyroxine  100 MCG tablet Commonly known as: SYNTHROID  Take 1 tablet (100 mcg total) by mouth daily.   loratadine  10 MG tablet Commonly known as: CLARITIN  Take 10 mg by mouth daily.   rosuvastatin  20 MG tablet Commonly known as:  CRESTOR  Take 1 tablet (20 mg total) by mouth daily.   tamsulosin  0.4 MG Caps capsule Commonly known as: FLOMAX  Take 0.8 mg by mouth daily.        Consultations: None  Procedures/Studies:   ECHOCARDIOGRAM COMPLETE Result Date: 06/28/2023    ECHOCARDIOGRAM REPORT   Patient Name:   AYANSH FEUTZ Date of Exam: 06/28/2023 Medical Rec #:  [id number]      Height:       69.0 in Accession #:    [id number]     Weight:       185.2 lb Date of Birth:  01/29/1931      BSA:          2.000 m Patient Age:    89 years       BP:           140/81 mmHg Patient Gender: M              HR:           89 bpm. Exam Location:  Inpatient Procedure: 2D Echo, Cardiac Doppler and Color Doppler (Both Spectral and Color  Flow Doppler were utilized during procedure). Indications:    Elevated Troponin  History:        Patient has prior history of Echocardiogram examinations, most                 recent 02/25/2023. TIA; Risk Factors:Former Smoker.  Sonographer:    Astrid Blamer Referring Phys: ABIGAIL CHAVEZ IMPRESSIONS  1. Left ventricular ejection fraction, by estimation, is 65 to 70%. The left ventricle has normal function. The left ventricle has no regional wall motion abnormalities. There is moderate asymmetric left ventricular hypertrophy of the basal-septal segment. Indeterminate diastolic filling due to E-A fusion.  2. Right ventricular systolic function is normal. The right ventricular size is normal.  3. The mitral valve is grossly normal. Trivial mitral valve regurgitation. No evidence of mitral stenosis.  4. The aortic valve is tricuspid. There is moderate calcification of the aortic valve. There is mild thickening of the aortic valve. Aortic valve regurgitation is trivial. Aortic valve sclerosis/calcification is present, without any evidence of aortic stenosis.  5. The inferior vena cava is dilated in size with >50% respiratory variability, suggesting right atrial pressure of 8 mmHg. Comparison(s): No significant  change from prior study. FINDINGS  Left Ventricle: Left ventricular ejection fraction, by estimation, is 65 to 70%. The left ventricle has normal function. The left ventricle has no regional wall motion abnormalities. The left ventricular internal cavity size was normal in size. There is  moderate asymmetric left ventricular hypertrophy of the basal-septal segment. Indeterminate diastolic filling due to E-A fusion. Right Ventricle: The right ventricular size is normal. Right vetricular wall thickness was not well visualized. Right ventricular systolic function is normal. Left Atrium: Left atrial size was normal in size. Right Atrium: Right atrial size was normal in size. Pericardium: There is no evidence of pericardial effusion. Mitral Valve: The mitral valve is grossly normal. Trivial mitral valve regurgitation. No evidence of mitral valve stenosis. Tricuspid Valve: The tricuspid valve is normal in structure. Tricuspid valve regurgitation is mild . No evidence of tricuspid stenosis. Aortic Valve: The aortic valve is tricuspid. There is moderate calcification of the aortic valve. There is mild thickening of the aortic valve. Aortic valve regurgitation is trivial. Aortic valve sclerosis/calcification is present, without any evidence of aortic stenosis. Aortic valve mean gradient measures 6.0 mmHg. Aortic valve peak gradient measures 9.1 mmHg. Aortic valve area, by VTI measures 2.10 cm. Pulmonic Valve: The pulmonic valve was not well visualized. Pulmonic valve regurgitation is not visualized. Aorta: The aortic root, ascending aorta and aortic arch are all structurally normal, with no evidence of dilitation or obstruction. Venous: The inferior vena cava is dilated in size with greater than 50% respiratory variability, suggesting right atrial pressure of 8 mmHg. IAS/Shunts: The atrial septum is grossly normal.  LEFT VENTRICLE PLAX 2D LVIDd:         4.00 cm LVIDs:         2.80 cm LV PW:         0.70 cm LV IVS:         1.40 cm LVOT diam:     2.00 cm LV SV:         64 LV SV Index:   32 LVOT Area:     3.14 cm  RIGHT VENTRICLE RV S prime:     20.80 cm/s TAPSE (M-mode): 3.1 cm LEFT ATRIUM             Index        RIGHT ATRIUM  Index LA Vol (A2C):   58.1 ml 29.06 ml/m  RA Area:     22.30 cm LA Vol (A4C):   45.6 ml 22.81 ml/m  RA Volume:   62.10 ml  31.06 ml/m LA Biplane Vol: 52.3 ml 26.16 ml/m  AORTIC VALVE AV Area (Vmax):    2.35 cm AV Area (Vmean):   2.28 cm AV Area (VTI):     2.10 cm AV Vmax:           151.00 cm/s AV Vmean:          114.000 cm/s AV VTI:            0.303 m AV Peak Grad:      9.1 mmHg AV Mean Grad:      6.0 mmHg LVOT Vmax:         113.00 cm/s LVOT Vmean:        82.800 cm/s LVOT VTI:          0.203 m LVOT/AV VTI ratio: 0.67  AORTA Ao Root diam: 3.50 cm MITRAL VALVE MV Area (PHT): 5.27 cm     SHUNTS MV Decel Time: 144 msec     Systemic VTI:  0.20 m MV E velocity: 157.00 cm/s  Systemic Diam: 2.00 cm Sheryle Donning MD Electronically signed by Sheryle Donning MD Signature Date/Time: 06/28/2023/2:38:41 PM    Final    DG Foot Complete Left Result Date: 06/27/2023 CLINICAL DATA:  Fall and trauma to the left foot. EXAM: LEFT FOOT - COMPLETE 3+ VIEW COMPARISON:  None Available. FINDINGS: There is no acute fracture or dislocation. The bones are osteopenic. Degenerative changes of the first MTP joint. The soft tissues are unremarkable. IMPRESSION: 1. No acute fracture or dislocation. 2. Osteopenia. Electronically Signed   By: Angus Bark M.D.   On: 06/27/2023 12:16   CT CHEST ABDOMEN PELVIS WO CONTRAST Result Date: 06/27/2023 CLINICAL DATA:  89 year old male found down this morning, hypotensive and unresponsive. Back pain. On Eliquis and Plavix . EXAM: CT CHEST, ABDOMEN AND PELVIS WITHOUT CONTRAST TECHNIQUE: Multidetector CT imaging of the chest, abdomen and pelvis was performed following the standard protocol without IV contrast. RADIATION DOSE REDUCTION: This exam was performed according  to the departmental dose-optimization program which includes automated exposure control, adjustment of the mA and/or kV according to patient size and/or use of iterative reconstruction technique. COMPARISON:  Chest CT 03/03/2022. FINDINGS: CT CHEST FINDINGS Cardiovascular: Advanced Calcified aortic atherosclerosis. Mild cardiomegaly is new since last year. No pericardial effusion. Vascular patency is not evaluated in the absence of IV contrast. Advanced calcified coronary artery atherosclerosis. Mediastinum/Nodes: Chronic thyroidectomy. Noncontrast mediastinum appears negative for hematoma, lymphadenopathy, mass. Lungs/Pleura: Lower lung volumes. Major airways remain patent with atelectatic changes. Dependent atelectasis in both lungs. No pneumothorax. Positive for small right pleural effusion. Chronic superior segment right lower lobe lung nodule appears progressed since last year, now appears to be mixed solid and sub solid, lobulated margins, 1.7 cm. See series 6, image 78. No convincing pulmonary contusion. Musculoskeletal: Thoracic vertebrae appear stable from last year, chronic T12 superior endplate compression. Visible shoulder osseous structures appear intact and aligned. No sternal fracture identified. No acute rib fracture identified. No superficial chest wall injury identified. CT ABDOMEN PELVIS FINDINGS Hepatobiliary: Noncontrast liver and gallbladder appear negative. No perihepatic fluid identified. Pancreas: Fatty atrophy. Spleen: Noncontrast spleen appears negative. No perisplenic fluid identified. Adrenals/Urinary Tract: Normal adrenal glands. Bilateral enlarged renal pelves (series 4, image 74), symmetric bilateral pararenal space inflammatory stranding which is nonspecific. Renal vascular  calcifications. No definite collecting system calculus. Mild bilateral hydroureter and periureteral stranding which continues into the pelvis. In the pelvis the urinary bladder is decompressed by a Foley catheter  but appears highly redundant, diffusely thick-walled, and inflamed (series 4, image 117). There is a right posterior 3.4 cm urinary bladder diverticulum which is not decompressed. Pelvic phleboliths. No definite obstructing urinary calculus. Stomach/Bowel: Decompressed large bowel from the splenic flexure distally. Extensive diverticulosis in the descending and sigmoid colon, no definite active inflammation. Decompressed transverse colon. Retained stool in the right colon. Evidence of normal retrocecal appendix on series 4, image 107. Diverticulosis in the distal small bowel. No active inflammation identified. Small volume retained fluid in the stomach. No pneumoperitoneum. Pararenal space inflammatory stranding, no convincing peritoneal space stranding or free fluid. Vascular/Lymphatic: Extensive Aortoiliac calcified atherosclerosis. Vascular patency is not evaluated in the absence of IV contrast. Tortuous abdominal aorta without aneurysmal enlargement. No lymphadenopathy identified. There are several small chronic calcified left lower quadrant lymph nodes which appear to be postinflammatory. Reproductive: Urethral catheter in place.  Prostatomegaly. Other: Nonspecific presacral edema or stranding. Abnormal perivesical stranding, inflammation as detailed above. Musculoskeletal: L2 superior endplate compression was visible last year and appears stable. Lumbar vertebrae appear intact, stable compared to those visible last year. Sacrum, SI joints, pelvis, proximal femurs appear intact. IMPRESSION: 1. Enlarged superior segment left lower lobe lung nodule since last year, now 1.7 cm, highly suspicious for Bronchogenic Carcinoma. PET-CT and referral to [facility]) may be the next best step in evaluation. 2. No acute traumatic injury identified in the noncontrast chest, abdomen, or pelvis. Chronic T12 and L2 compression fractures. 3. Abnormal urinary bladder appears thick-walled and  inflamed, despite decompression by Foley catheter. And there is bilateral ureter and renal pelvis enlargement with symmetric perinephric space inflammation which is probably reflecting recent bilateral obstructive uropathy. Urinary infection not excluded. 4. Small right pleural effusion. Lower lung volumes with atelectasis. 5.  Aortic Atherosclerosis (ICD10-I70.0). Electronically Signed   By: Marlise Simpers M.D.   On: 06/27/2023 10:50   CT Cervical Spine Wo Contrast Result Date: 06/27/2023 CLINICAL DATA:  89 year old male found down this morning, hypotensive and unresponsive. Back pain. On Eliquis and Plavix . EXAM: CT CERVICAL SPINE WITHOUT CONTRAST TECHNIQUE: Multidetector CT imaging of the cervical spine was performed without intravenous contrast. Multiplanar CT image reconstructions were also generated. RADIATION DOSE REDUCTION: This exam was performed according to the departmental dose-optimization program which includes automated exposure control, adjustment of the mA and/or kV according to patient size and/or use of iterative reconstruction technique. COMPARISON:  Head CT today.  Prior CTA head and neck 02/25/2023. FINDINGS: Alignment: Stable. Exaggerated upper cervical lordosis. Levoconvex cervical scoliosis. Cervicothoracic junction alignment is within normal limits. Stable posterior element alignment. Skull base and vertebrae: Visualized skull base is intact. No atlanto-occipital dissociation. C1 and C2 appear intact and aligned. No acute osseous abnormality identified. Soft tissues and spinal canal: No prevertebral fluid or swelling. No visible canal hematoma. Negative visible noncontrast neck soft tissues aside from calcified carotid atherosclerosis. Disc levels: Advanced chronic cervical spine degeneration appears stable from the CTA in February. Upper chest: Chest CT today reported separately. IMPRESSION: 1. No acute traumatic injury identified in the cervical spine. 2. Advanced chronic cervical spine  degeneration appears stable from a CTA in February. 3. Chest CT today reported separately. Electronically Signed   By: Marlise Simpers M.D.   On: 06/27/2023 10:28   CT Head Wo Contrast Result Date: 06/27/2023 CLINICAL DATA:  89 year old male found down this morning, hypotensive and unresponsive. Back pain. On Eliquis and Plavix . EXAM: CT HEAD WITHOUT CONTRAST TECHNIQUE: Contiguous axial images were obtained from the base of the skull through the vertex without intravenous contrast. RADIATION DOSE REDUCTION: This exam was performed according to the departmental dose-optimization program which includes automated exposure control, adjustment of the mA and/or kV according to patient size and/or use of iterative reconstruction technique. COMPARISON:  Brain MRI 02/25/2023.  Head CT 02/25/2023. FINDINGS: Brain: Stable cerebral volume. No midline shift, ventriculomegaly, mass effect, evidence of mass lesion, intracranial hemorrhage or evidence of cortically based acute infarction. Expected evolution, cystic encephalomalacia in the right thalamus from February lacune. Elsewhere stable gray-white differentiation. Vascular: Chronic arterial tortuosity. Calcified atherosclerosis at the skull base. Skull: Stable.  No acute osseous abnormality identified. Sinuses/Orbits: Visualized paranasal sinuses and mastoids are stable and well aerated. Other: Mild left anterior and lateral convexity asymmetric scalp soft tissue swelling suspicious for mild hematoma/contusion series 3, image 62. No scalp soft tissue gas. Underlying calvarium appears intact. Orbits soft tissues appears stable. IMPRESSION: 1. Mild left scalp soft tissue swelling suspicious for contusion/hematoma. No skull fracture identified. 2. No acute intracranial abnormality. Expected evolution of a right thalamic lacune since February. Electronically Signed   By: Marlise Simpers M.D.   On: 06/27/2023 10:22   DG Chest Portable 1 View Result Date: 06/27/2023 CLINICAL DATA:  Fall.  EXAM: PORTABLE CHEST 1 VIEW COMPARISON:  10/19/2022. FINDINGS: The heart size and mediastinal contours are within normal limits. Aortic atherosclerosis. No focal consolidation, sizeable pleural effusion, or pneumothorax. No acute osseous abnormality identified. IMPRESSION: No acute findings in the chest. Electronically Signed   By: Mannie Seek M.D.   On: 06/27/2023 09:48       The results of significant diagnostics from this hospitalization (including imaging, microbiology, ancillary and laboratory) are listed below for reference.     Microbiology: Recent Results (from the past 240 hours)  Culture, blood (routine x 2)     Status: None (Preliminary result)   Collection Time: 06/27/23  8:55 AM   Specimen: BLOOD RIGHT ARM  Result Value Ref Range Status   Specimen Description BLOOD RIGHT ARM  Final   Special Requests   Final    BOTTLES DRAWN AEROBIC AND ANAEROBIC Blood Culture results may not be optimal due to an inadequate volume of blood received in culture bottles   Culture   Final    NO GROWTH 3 DAYS Performed at [facility], [address], [place], [place] [address]    Report Status PENDING  Incomplete  Culture, blood (routine x 2)     Status: None (Preliminary result)   Collection Time: 06/27/23 10:08 AM   Specimen: BLOOD RIGHT HAND  Result Value Ref Range Status   Specimen Description BLOOD RIGHT HAND  Final   Special Requests   Final    AEROBIC BOTTLE ONLY Blood Culture results may not be optimal due to an inadequate volume of blood received in culture bottles   Culture   Final    NO GROWTH 3 DAYS Performed at [facility], [address] [place], [place] [address]    Report Status PENDING  Incomplete  Urine Culture     Status: None   Collection Time: 06/27/23  1:01 PM   Specimen: Urine, Catheterized  Result Value Ref Range Status   Specimen Description URINE, CATHETERIZED  Final   Special Requests NONE  Final   Culture   Final  NO GROWTH Performed  at [facility], [address] [place], [place] [address]    Report Status 06/28/2023 FINAL  Final     Labs:  CBC: Recent Labs  Lab 06/27/23 0855 06/27/23 0908 06/28/23 0043 06/29/23 0639  WBC 16.5*  --  10.0 9.3  NEUTROABS 13.3*  --   --   --   HGB 13.2 13.3 12.2* 13.3  HCT 39.0 39.0 35.8* 39.0  MCV 89.4  --  87.5 87.8  PLT 273  --  229 258   BMP &GFR Recent Labs  Lab 06/27/23 0855 06/27/23 0908 06/28/23 0043 06/29/23 0639 06/30/23 1009  NA 133* 133* 138 139 136  K 3.8 3.6 3.5 2.9* 3.8  CL 100 99 105 101 102  CO2 23  --  24 26 25   GLUCOSE 127* 124* 101* 102* 128*  BUN 24* 24* 17 7* 8  CREATININE 2.95* 2.90* 1.19 0.78 0.72  CALCIUM  10.1  --  9.4 9.0 9.0  MG  --   --   --  1.5* 1.7  PHOS  --   --   --  2.7 2.8   Estimated Creatinine Clearance: 62.6 mL/min (by C-G formula based on SCr of 0.72 mg/dL). Liver & Pancreas: Recent Labs  Lab 06/27/23 0855 06/29/23 0639 06/30/23 1009  AST 28  --   --   ALT 16  --   --   ALKPHOS 50  --   --   BILITOT 1.0  --   --   PROT 5.6*  --   --   ALBUMIN 3.4* 3.3* 3.2*   No results for input(s): LIPASE, AMYLASE in the last 168 hours. No results for input(s): AMMONIA in the last 168 hours. Diabetic: No results for input(s): HGBA1C in the last 72 hours. No results for input(s): GLUCAP in the last 168 hours. Cardiac Enzymes: Recent Labs  Lab 06/27/23 0855  CKTOTAL 102   No results for input(s): PROBNP in the last 8760 hours. Coagulation Profile: No results for input(s): INR, PROTIME in the last 168 hours. Thyroid  Function Tests: Recent Labs    06/27/23 1741  TSH 0.558   Lipid Profile: No results for input(s): CHOL, HDL, LDLCALC, TRIG, CHOLHDL, LDLDIRECT in the last 72 hours. Anemia Panel: No results for input(s): VITAMINB12, FOLATE, FERRITIN, TIBC, IRON, RETICCTPCT in the last 72 hours. Urine analysis:    Component Value Date/Time   COLORURINE YELLOW 06/27/2023  0917   APPEARANCEUR CLEAR 06/27/2023 0917   LABSPEC 1.008 06/27/2023 0917   PHURINE 6.0 06/27/2023 0917   GLUCOSEU NEGATIVE 06/27/2023 0917   HGBUR SMALL (A) 06/27/2023 0917   BILIRUBINUR NEGATIVE 06/27/2023 0917   KETONESUR NEGATIVE 06/27/2023 0917   PROTEINUR NEGATIVE 06/27/2023 0917   UROBILINOGEN 0.2 10/10/2010 1220   NITRITE NEGATIVE 06/27/2023 0917   LEUKOCYTESUR NEGATIVE 06/27/2023 0917   Sepsis Labs: Invalid input(s): PROCALCITONIN, LACTICIDVEN   SIGNED:  Othon Guardia T Lynna Zamorano, MD  [facility] 06/30/2023, 12:49 PM

## 2023-07-01 DIAGNOSIS — N179 Acute kidney failure, unspecified: Secondary | ICD-10-CM | POA: Diagnosis not present

## 2023-07-01 DIAGNOSIS — W19XXXA Unspecified fall, initial encounter: Secondary | ICD-10-CM | POA: Diagnosis not present

## 2023-07-01 DIAGNOSIS — R531 Weakness: Secondary | ICD-10-CM | POA: Diagnosis not present

## 2023-07-01 DIAGNOSIS — R6510 Systemic inflammatory response syndrome (SIRS) of non-infectious origin without acute organ dysfunction: Secondary | ICD-10-CM | POA: Diagnosis not present

## 2023-07-01 NOTE — TOC Progression Note (Signed)
 Transition of Care [facility]) - Progression Note    Patient Details  Name: Steve Thompson MRN: [id number] Date of Birth: [birth year]  Transition of Care [facility]) CM/SW Contact  [address] [place], [place] Phone Number: 07/01/2023, 12:53 PM  Clinical Narrative:    Sherel Dikes remains pending, requested clinical uploaded on 06/30/23. CSW met with patient and spouse at bedside. Per patient's spouse, she would prefer [facility] for rehab if available, then [facility]. VM left with Daril Edge at [facility] to request review of clinical information for bed consideration.  Renad Jenniges, LCSW Transition of Care      Expected Discharge Plan: Skilled Nursing Facility Barriers to Discharge: Continued Medical Work up, SNF Pending bed offer  Expected Discharge Plan and Services In-house Referral: Clinical Social Work   Post Acute Care Choice: Skilled Nursing Facility Living arrangements for the past 2 months: Single Family Home Expected Discharge Date: 06/30/23                                     Social Determinants of Health (SDOH) Interventions SDOH Screenings   Food Insecurity: Patient Declined (06/28/2023)  Housing: Low Risk  (06/28/2023)  Transportation Needs: No Transportation Needs (06/28/2023)  Utilities: Patient Declined (06/28/2023)  Social Connections: Unknown (06/28/2023)  Tobacco Use: Medium Risk (06/27/2023)    Readmission Risk Interventions     No data to display

## 2023-07-01 NOTE — Plan of Care (Signed)

## 2023-07-01 NOTE — Plan of Care (Signed)
  Problem: Education: Goal: Knowledge of General Education information will improve Description: Including pain rating scale, medication(s)/side effects and non-pharmacologic comfort measures 07/01/2023 0739 by Joanna Muck, RN Outcome: Progressing 07/01/2023 0739 by Joanna Muck, RN Outcome: Progressing   Problem: Health Behavior/Discharge Planning: Goal: Ability to manage health-related needs will improve Outcome: Progressing   Problem: Clinical Measurements: Goal: Ability to maintain clinical measurements within normal limits will improve Outcome: Progressing Goal: Will remain free from infection 07/01/2023 0739 by Joanna Muck, RN Outcome: Progressing 07/01/2023 0739 by Joanna Muck, RN Outcome: Progressing Goal: Diagnostic test results will improve 07/01/2023 0739 by Joanna Muck, RN Outcome: Progressing 07/01/2023 0739 by Joanna Muck, RN Outcome: Progressing Goal: Respiratory complications will improve Outcome: Progressing Goal: Cardiovascular complication will be avoided Outcome: Progressing   Problem: Activity: Goal: Risk for activity intolerance will decrease Outcome: Progressing   Problem: Nutrition: Goal: Adequate nutrition will be maintained Outcome: Progressing   Problem: Coping: Goal: Level of anxiety will decrease Outcome: Progressing   Problem: Elimination: Goal: Will not experience complications related to bowel motility Outcome: Progressing Goal: Will not experience complications related to urinary retention Outcome: Progressing   Problem: Pain Managment: Goal: General experience of comfort will improve and/or be controlled Outcome: Progressing   Problem: Safety: Goal: Ability to remain free from injury will improve Outcome: Progressing   Problem: Skin Integrity: Goal: Risk for impaired skin integrity will decrease Outcome: Progressing

## 2023-07-01 NOTE — Plan of Care (Signed)
  Problem: Activity: Goal: Risk for activity intolerance will decrease Outcome: Progressing   Problem: Safety: Goal: Ability to remain free from injury will improve Outcome: Progressing   

## 2023-07-01 NOTE — Progress Notes (Signed)
 PROGRESS NOTE  Steve Thompson ZOX:[id number] DOB: October 15, 1931   PCP: [name], MD  Patient is from: Home.  DOA: 06/27/2023 LOS: 4  Chief complaints Chief Complaint  Patient presents with   Hypotension     Brief Narrative / Interim history: 89 year old M with PMH of cognitive impairment, myasthenia gravis, frequent falls, hyperparathyroidism, chronic back pain, BPH, hypercalcemia and neuropathy presenting with generalized weakness, poor p.o. intake and fall at home.  Reportedly, he was found down on the floor by his wife barely able to talk the morning of his presentation.  He has been experiencing urinary retention with dribbling and inability to urinate effectively for couple of days.  He was scheduled to see urologist before presenting to the hospital.   In ED, stable vitals.  WBC 16.5. Cr 2.9.  BUN 24.  Troponin 22.  CK1 102.  CXR and UA without significant finding.  CT head showed small left scalp soft tissue contusion without any acute intracranial abnormality.  CT chest, abdomen and pelvis noted a large superior segment of left lower lobe lung nodule since last year measuring 1.7 cm concerning for bronchogenic carcinoma, chronic T12 and L1 compression fractures and abnormal urinary bladder wall thickening despite decompression by Foley catheter.  Foley catheter inserted with return of 900 cc of urine.  Patient received IV fluid and empirically started on IV ceftriaxone .    Sepsis ruled out.  Antibiotics discontinued.  AKI resolved.  Patient is discharged with Foley catheter until outpatient follow-up with urology for voiding trial and further evaluation.   TTE with LVEF of 65 to 70%, moderate asymmetric LVH, indeterminate DD.  Therapy recommended SNF.  Medically stable for discharge   Subjective: Seen and examined earlier this morning.  No major events overnight or this morning.  No major events overnight of this morning.  No complaints.  Eager to go home but understands the  need for rehab/SNF.  Objective: Vitals:   06/30/23 2035 07/01/23 0539 07/01/23 0836 07/01/23 1530  BP: 127/73 136/73 (!) 109/58 (!) 125/97  Pulse: 95 95 92 95  Resp: 18 18 20 18   Temp: 98.8 F (37.1 C) 98.4 F (36.9 C) 97.9 F (36.6 C) 98.1 F (36.7 C)  TempSrc:      SpO2: 92% 90% 95% 94%  Weight:      Height:        Examination:  GENERAL: No apparent distress.  Nontoxic. HEENT: MMM.  Vision and hearing grossly intact.  NECK: Supple.  No apparent JVD.  RESP:  No IWOB.  Fair aeration bilaterally. CVS:  RRR. Heart sounds normal.  ABD/GI/GU: BS+. Abd soft, NTND.  Foley catheter in place. MSK/EXT:  Moves extremities. No apparent deformity. No edema.  SKIN: no apparent skin lesion or wound NEURO: AA.  Oriented x 4 except date.  No apparent focal neuro deficit. PSYCH: Calm. Normal affect.   Consultants:  None  Procedures: None  Microbiology summarized: Urine culture NGTD Blood culture NGTD  Assessment and plan: Acute kidney injury secondary to BPH with urinary retention: Resolving.  CT shows abnormal urinary bladder wall thickening despite decompression.  UA does not suggest UTI.  Urine culture NGTD. Recent Labs    08/23/22 1032 02/25/23 1420 02/25/23 1426 02/27/23 0440 06/27/23 0855 06/27/23 0908 06/28/23 0043 06/29/23 0639 06/30/23 1009  BUN 17 13 15 8  24* 24* 17 7* 8  CREATININE 0.95 0.87 0.80 0.85 2.95* 2.90* 1.19 0.78 0.72  - Continue Foley catheter until outpatient follow-up with urology  Acute urinary  retention/BPH: 900 cc urine output after inserting Foley - Continue Foley catheter until outpatient follow-up with urology - Continue Flomax   Elevated troponin/BNP: Troponin trended from 22 and peaked up at 328 and trended down.  Patient denies cardiac symptoms although not a great historian.  TTE without significant finding.  Could be delayed clearance from AKI. -Continue home Plavix  and Crestor   SIRS: Likely reactive.  UA, urine culture and blood  culture NGTD.  SIRS resolved.  Sepsis ruled out.  Fall at home/head contusion/generalized weakness -Fall precaution, PT/OT  Lung nodule: CT chest showed enlarged 1.7 cm LLL nodule concerning for bronchogenic carcinoma. -Pulmonology consulted and will arrange outpatient follow-up   Hyponatremia: Likely hypovolemic from poor p.o. intake.  Resolved. - Recheck in the morning   History of CVA: Had right nonhemorrhagic thalamocapsular ischemic stroke back in 02/2023.   -Continue Plavix  and statin   Postsurgical hypothyroidism: TSH normal - Continue home Synthroid    Compression fracture: Chronic T12 and L1 compression fractures noted on CT. pathology?   Cognitive impairment: -Reorientation and delirium precautions  History of ocular myasthenia gravis: Followed by neurology.  Per last neurology note in 02/2023, supposed to be on Mestinon  30 mg 3 times daily as needed.  Stable. -Outpatient follow-up with neurology.   Hyperlipidemia - Continue Crestor   Hypokalemia/hypomagnesemia - Monitor replenish as appropriate  Body mass index is 27.35 kg/m.           DVT prophylaxis:  heparin  injection 5,000 Units Start: 06/27/23 1400  Code Status: Full code Family Communication: None at bedside Level of care: Telemetry Medical Status is: Inpatient Remains inpatient appropriate because: SNF bed.   Final disposition: SNF.   35 minutes with more than 50% spent in reviewing records, counseling patient/family and coordinating care.   Sch Meds:  Scheduled Meds:  Chlorhexidine  Gluconate Cloth  6 each Topical Daily   clopidogrel   75 mg Oral Daily   gabapentin   300 mg Oral BID   heparin   5,000 Units Subcutaneous Q8H   levothyroxine   100 mcg Oral Daily   loratadine   10 mg Oral Daily   rosuvastatin   20 mg Oral Daily   sodium chloride  flush  3 mL Intravenous Q12H   tamsulosin   0.8 mg Oral Daily   Continuous Infusions: PRN Meds:.acetaminophen  **OR** acetaminophen ,  albuterol   Antimicrobials: Anti-infectives (From admission, onward)    Start     Dose/Rate Route Frequency Ordered Stop   06/27/23 1130  cefTRIAXone  (ROCEPHIN ) 1 g in sodium chloride  0.9 % 100 mL IVPB        1 g 200 mL/hr over 30 Minutes Intravenous  Once 06/27/23 1128 06/27/23 1250        I have personally reviewed the following labs and images: CBC: Recent Labs  Lab 06/27/23 0855 06/27/23 0908 06/28/23 0043 06/29/23 0639  WBC 16.5*  --  10.0 9.3  NEUTROABS 13.3*  --   --   --   HGB 13.2 13.3 12.2* 13.3  HCT 39.0 39.0 35.8* 39.0  MCV 89.4  --  87.5 87.8  PLT 273  --  229 258   BMP &GFR Recent Labs  Lab 06/27/23 0855 06/27/23 0908 06/28/23 0043 06/29/23 0639 06/30/23 1009  NA 133* 133* 138 139 136  K 3.8 3.6 3.5 2.9* 3.8  CL 100 99 105 101 102  CO2 23  --  24 26 25   GLUCOSE 127* 124* 101* 102* 128*  BUN 24* 24* 17 7* 8  CREATININE 2.95* 2.90* 1.19 0.78 0.72  CALCIUM  10.1  --  9.4 9.0 9.0  MG  --   --   --  1.5* 1.7  PHOS  --   --   --  2.7 2.8   Estimated Creatinine Clearance: 62.6 mL/min (by C-G formula based on SCr of 0.72 mg/dL). Liver & Pancreas: Recent Labs  Lab 06/27/23 0855 06/29/23 0639 06/30/23 1009  AST 28  --   --   ALT 16  --   --   ALKPHOS 50  --   --   BILITOT 1.0  --   --   PROT 5.6*  --   --   ALBUMIN 3.4* 3.3* 3.2*   No results for input(s): LIPASE, AMYLASE in the last 168 hours. No results for input(s): AMMONIA in the last 168 hours. Diabetic: No results for input(s): HGBA1C in the last 72 hours. No results for input(s): GLUCAP in the last 168 hours. Cardiac Enzymes: Recent Labs  Lab 06/27/23 0855  CKTOTAL 102   No results for input(s): PROBNP in the last 8760 hours. Coagulation Profile: No results for input(s): INR, PROTIME in the last 168 hours. Thyroid  Function Tests: No results for input(s): TSH, T4TOTAL, FREET4, T3FREE, THYROIDAB in the last 72 hours.  Lipid Profile: No results for  input(s): CHOL, HDL, LDLCALC, TRIG, CHOLHDL, LDLDIRECT in the last 72 hours. Anemia Panel: No results for input(s): VITAMINB12, FOLATE, FERRITIN, TIBC, IRON, RETICCTPCT in the last 72 hours. Urine analysis:    Component Value Date/Time   COLORURINE YELLOW 06/27/2023 0917   APPEARANCEUR CLEAR 06/27/2023 0917   LABSPEC 1.008 06/27/2023 0917   PHURINE 6.0 06/27/2023 0917   GLUCOSEU NEGATIVE 06/27/2023 0917   HGBUR SMALL (A) 06/27/2023 0917   BILIRUBINUR NEGATIVE 06/27/2023 0917   KETONESUR NEGATIVE 06/27/2023 0917   PROTEINUR NEGATIVE 06/27/2023 0917   UROBILINOGEN 0.2 10/10/2010 1220   NITRITE NEGATIVE 06/27/2023 0917   LEUKOCYTESUR NEGATIVE 06/27/2023 0917   Sepsis Labs: Invalid input(s): PROCALCITONIN, LACTICIDVEN  Microbiology: Recent Results (from the past 240 hours)  Culture, blood (routine x 2)     Status: None (Preliminary result)   Collection Time: 06/27/23  8:55 AM   Specimen: BLOOD RIGHT ARM  Result Value Ref Range Status   Specimen Description BLOOD RIGHT ARM  Final   Special Requests   Final    BOTTLES DRAWN AEROBIC AND ANAEROBIC Blood Culture results may not be optimal due to an inadequate volume of blood received in culture bottles   Culture   Final    NO GROWTH 4 DAYS Performed at [facility], [address] [place], [place] [address]    Report Status PENDING  Incomplete  Culture, blood (routine x 2)     Status: None (Preliminary result)   Collection Time: 06/27/23 10:08 AM   Specimen: BLOOD RIGHT HAND  Result Value Ref Range Status   Specimen Description BLOOD RIGHT HAND  Final   Special Requests   Final    AEROBIC BOTTLE ONLY Blood Culture results may not be optimal due to an inadequate volume of blood received in culture bottles   Culture   Final    NO GROWTH 4 DAYS Performed at [facility], [address], [place], [place] [address]    Report Status PENDING  Incomplete  Urine Culture     Status: None    Collection Time: 06/27/23  1:01 PM   Specimen: Urine, Catheterized  Result Value Ref Range Status   Specimen Description URINE, CATHETERIZED  Final   Special Requests NONE  Final  Culture   Final    NO GROWTH Performed at [facility], [address], [place], [place] [address]    Report Status 06/28/2023 FINAL  Final    Radiology Studies: No results found.     Kyree Fedorko T. Jameika Kinn [facility]  If 7PM-7AM, please contact night-coverage [email or web address] 07/01/2023, 4:23 PM

## 2023-07-02 DIAGNOSIS — R6510 Systemic inflammatory response syndrome (SIRS) of non-infectious origin without acute organ dysfunction: Secondary | ICD-10-CM | POA: Diagnosis not present

## 2023-07-02 DIAGNOSIS — N179 Acute kidney failure, unspecified: Secondary | ICD-10-CM | POA: Diagnosis not present

## 2023-07-02 DIAGNOSIS — W19XXXA Unspecified fall, initial encounter: Secondary | ICD-10-CM | POA: Diagnosis not present

## 2023-07-02 DIAGNOSIS — R531 Weakness: Secondary | ICD-10-CM | POA: Diagnosis not present

## 2023-07-02 LAB — CULTURE, BLOOD (ROUTINE X 2)
Culture: NO GROWTH
Culture: NO GROWTH

## 2023-07-02 NOTE — TOC Progression Note (Addendum)
 Transition of Care [facility]) - Progression Note    Patient Details  Name: Steve Thompson MRN: [id number] Date of Birth: [birth year]  Transition of Care [facility]) CM/SW Contact  Elspeth Hals, LCSW Phone Number: 07/02/2023, 12:50 PM  Clinical Narrative:    0830: SNF auth request made to [facility], left message  1000: Bed offers provided.  Wife not present, unable to reach her by phone.  [facility] does offer but no private room.    1100: TC wife.  Wants private room.  Asking about [facility] and [facility] private rooms.  CSW reached out both do have private rooms depending on DC date.    1245: FF contact with wife in room, updated her on the above, she will accept offer at [facility].  She is also asking about medicaid and asking CSW to speak with her daughter Alvia Awkward.    [facility] informed.    Email sent to [facility] requesting medicaid screening. Message left with daughter Raenelle Bumpers received: [phone]    Expected Discharge Plan: Skilled Nursing Facility Barriers to Discharge: Continued Medical Work up, SNF Pending bed offer  Expected Discharge Plan and Services In-house Referral: Clinical Social Work   Post Acute Care Choice: Skilled Nursing Facility Living arrangements for the past 2 months: Single Family Home Expected Discharge Date: 07/02/23                                     Social Determinants of Health (SDOH) Interventions SDOH Screenings   Food Insecurity: Patient Declined (06/28/2023)  Housing: Low Risk  (06/28/2023)  Transportation Needs: No Transportation Needs (06/28/2023)  Utilities: Patient Declined (06/28/2023)  Social Connections: Unknown (06/28/2023)  Tobacco Use: Medium Risk (06/27/2023)    Readmission Risk Interventions     No data to display

## 2023-07-02 NOTE — Plan of Care (Signed)
  Problem: Clinical Measurements: Goal: Will remain free from infection Outcome: Progressing Goal: Diagnostic test results will improve Outcome: Progressing   Problem: Elimination: Goal: Will not experience complications related to urinary retention Outcome: Progressing   Problem: Pain Managment: Goal: General experience of comfort will improve and/or be controlled Outcome: Progressing

## 2023-07-02 NOTE — Progress Notes (Signed)
 Mobility Specialist Progress Note:    07/02/23 1432  Therapy Vitals  Temp 98.7 F (37.1 C)  Pulse Rate 95  Resp 16  BP (!) 111/54  Patient Position (if appropriate) Sitting  Oxygen Therapy  SpO2 93 %  O2 Device Room Air  Mobility  Activity Ambulated with assistance in hallway  Level of Assistance Contact guard assist, steadying assist  Assistive Device Front wheel walker  Distance Ambulated (ft) 130 ft  Activity Response Tolerated well  Mobility Referral Yes  Mobility visit 1 Mobility  Mobility Specialist Start Time (ACUTE ONLY) 1436  Mobility Specialist Stop Time (ACUTE ONLY) 1450  Mobility Specialist Time Calculation (min) (ACUTE ONLY) 14 min   Pt received in chair and agreeable. C/o BLE weakness, otherwise asymptomatic. Returned to room w/o fault. Pt left in bed with call bell and all needs met. Bed alarm on and family present.  D'Vante Nolon Baxter Mobility Specialist Please contact via Special educational needs teacher or Rehab office at [phone]

## 2023-07-02 NOTE — Progress Notes (Signed)
 PROGRESS NOTE  Steve Thompson:[id number] DOB: 03-04-1931   PCP: [name], MD  Patient is from: Home.  DOA: 06/27/2023 LOS: 5  Chief complaints Chief Complaint  Patient presents with   Hypotension     Brief Narrative / Interim history: 89 year old M with PMH of cognitive impairment, myasthenia gravis, frequent falls, hyperparathyroidism, chronic back pain, BPH, hypercalcemia and neuropathy presenting with generalized weakness, poor p.o. intake and fall at home.  Reportedly, he was found down on the floor by his wife barely able to talk the morning of his presentation.  He has been experiencing urinary retention with dribbling and inability to urinate effectively for couple of days.  He was scheduled to see urologist before presenting to the hospital.   In ED, stable vitals.  WBC 16.5. Cr 2.9.  BUN 24.  Troponin 22.  CK1 102.  CXR and UA without significant finding.  CT head showed small left scalp soft tissue contusion without any acute intracranial abnormality.  CT chest, abdomen and pelvis noted a large superior segment of left lower lobe lung nodule since last year measuring 1.7 cm concerning for bronchogenic carcinoma, chronic T12 and L1 compression fractures and abnormal urinary bladder wall thickening despite decompression by Foley catheter.  Foley catheter inserted with return of 900 cc of urine.  Patient received IV fluid and empirically started on IV ceftriaxone .    Sepsis ruled out.  Antibiotics discontinued.  AKI resolved.  Patient is discharged with Foley catheter until outpatient follow-up with urology for voiding trial and further evaluation.   TTE with LVEF of 65 to 70%, moderate asymmetric LVH, indeterminate DD.  Therapy recommended SNF.  Medically stable for discharge   Subjective: Seen and examined earlier this morning.  No major events overnight or this morning.  No major events overnight of this morning.  No complaints.  Eager to go home but understands the  need for rehab/SNF.  Objective: Vitals:   07/01/23 2058 07/02/23 0330 07/02/23 0739 07/02/23 1432  BP: (!) 122/57 139/77 100/60 (!) 111/54  Pulse: 95 87 81 95  Resp: 20 16 16 16   Temp: 97.7 F (36.5 C) 98.6 F (37 C) 97.9 F (36.6 C) 98.7 F (37.1 C)  TempSrc: Oral Oral    SpO2: 92% 90% 90% 93%  Weight:      Height:        Examination:  GENERAL: No apparent distress.  Nontoxic. HEENT: MMM.  Vision and hearing grossly intact.  NECK: Supple.  No apparent JVD.  RESP:  No IWOB.  Fair aeration bilaterally. CVS:  RRR. Heart sounds normal.  ABD/GI/GU: BS+. Abd soft, NTND.  Foley catheter in place. MSK/EXT:  Moves extremities. No apparent deformity. No edema.  SKIN: no apparent skin lesion or wound NEURO: AA.  Oriented x 4 except date.  No apparent focal neuro deficit. PSYCH: Calm. Normal affect.   Consultants:  None  Procedures: None  Microbiology summarized: Urine culture NGTD Blood culture NGTD  Assessment and plan: Acute kidney injury secondary to BPH with urinary retention: Resolving.  CT shows abnormal urinary bladder wall thickening despite decompression.  UA does not suggest UTI.  Urine culture NGTD. Recent Labs    08/23/22 1032 02/25/23 1420 02/25/23 1426 02/27/23 0440 06/27/23 0855 06/27/23 0908 06/28/23 0043 06/29/23 0639 06/30/23 1009  BUN 17 13 15 8  24* 24* 17 7* 8  CREATININE 0.95 0.87 0.80 0.85 2.95* 2.90* 1.19 0.78 0.72  - Continue Foley catheter until outpatient follow-up with urology  Acute urinary  retention/BPH: 900 cc urine output after inserting Foley - Continue Foley catheter until outpatient follow-up with urology - Continue Flomax   Elevated troponin/BNP: Troponin trended from 22 and peaked up at 328 and trended down.  Patient denies cardiac symptoms although not a great historian.  TTE without significant finding.  Could be delayed clearance from AKI. -Continue home Plavix  and Crestor   SIRS: Likely reactive.  UA, urine culture and  blood culture NGTD.  SIRS resolved.  Sepsis ruled out.  Fall at home/head contusion/generalized weakness -Fall precaution, PT/OT  Lung nodule: CT chest showed enlarged 1.7 cm LLL nodule concerning for bronchogenic carcinoma. -Pulmonology consulted and will arrange outpatient follow-up   Hyponatremia: Likely hypovolemic from poor p.o. intake.  Resolved. - Recheck in the morning   History of CVA: Had right nonhemorrhagic thalamocapsular ischemic stroke back in 02/2023.   -Continue Plavix  and statin   Postsurgical hypothyroidism: TSH normal - Continue home Synthroid    Compression fracture: Chronic T12 and L1 compression fractures noted on CT. pathology?   Cognitive impairment: -Reorientation and delirium precautions  History of ocular myasthenia gravis: Followed by neurology.  Per last neurology note in 02/2023, supposed to be on Mestinon  30 mg 3 times daily as needed.  Stable. -Outpatient follow-up with neurology.   Hyperlipidemia - Continue Crestor   Hypokalemia/hypomagnesemia - Monitor replenish as appropriate  Body mass index is 27.35 kg/m.           DVT prophylaxis:  heparin  injection 5,000 Units Start: 06/27/23 1400  Code Status: Full code Family Communication: None at bedside Level of care: Telemetry Medical Status is: Inpatient Remains inpatient appropriate because: SNF bed.   Final disposition: SNF.   35 minutes with more than 50% spent in reviewing records, counseling patient/family and coordinating care.   Sch Meds:  Scheduled Meds:  Chlorhexidine  Gluconate Cloth  6 each Topical Daily   clopidogrel   75 mg Oral Daily   gabapentin   300 mg Oral BID   heparin   5,000 Units Subcutaneous Q8H   levothyroxine   100 mcg Oral Daily   loratadine   10 mg Oral Daily   rosuvastatin   20 mg Oral Daily   sodium chloride  flush  3 mL Intravenous Q12H   tamsulosin   0.8 mg Oral Daily   Continuous Infusions: PRN Meds:.acetaminophen  **OR** acetaminophen ,  albuterol   Antimicrobials: Anti-infectives (From admission, onward)    Start     Dose/Rate Route Frequency Ordered Stop   06/27/23 1130  cefTRIAXone  (ROCEPHIN ) 1 g in sodium chloride  0.9 % 100 mL IVPB        1 g 200 mL/hr over 30 Minutes Intravenous  Once 06/27/23 1128 06/27/23 1250        I have personally reviewed the following labs and images: CBC: Recent Labs  Lab 06/27/23 0855 06/27/23 0908 06/28/23 0043 06/29/23 0639  WBC 16.5*  --  10.0 9.3  NEUTROABS 13.3*  --   --   --   HGB 13.2 13.3 12.2* 13.3  HCT 39.0 39.0 35.8* 39.0  MCV 89.4  --  87.5 87.8  PLT 273  --  229 258   BMP &GFR Recent Labs  Lab 06/27/23 0855 06/27/23 0908 06/28/23 0043 06/29/23 0639 06/30/23 1009  NA 133* 133* 138 139 136  K 3.8 3.6 3.5 2.9* 3.8  CL 100 99 105 101 102  CO2 23  --  24 26 25   GLUCOSE 127* 124* 101* 102* 128*  BUN 24* 24* 17 7* 8  CREATININE 2.95* 2.90* 1.19 0.78 0.72  CALCIUM  10.1  --  9.4 9.0 9.0  MG  --   --   --  1.5* 1.7  PHOS  --   --   --  2.7 2.8   Estimated Creatinine Clearance: 62.6 mL/min (by C-G formula based on SCr of 0.72 mg/dL). Liver & Pancreas: Recent Labs  Lab 06/27/23 0855 06/29/23 0639 06/30/23 1009  AST 28  --   --   ALT 16  --   --   ALKPHOS 50  --   --   BILITOT 1.0  --   --   PROT 5.6*  --   --   ALBUMIN 3.4* 3.3* 3.2*   No results for input(s): LIPASE, AMYLASE in the last 168 hours. No results for input(s): AMMONIA in the last 168 hours. Diabetic: No results for input(s): HGBA1C in the last 72 hours. No results for input(s): GLUCAP in the last 168 hours. Cardiac Enzymes: Recent Labs  Lab 06/27/23 0855  CKTOTAL 102   No results for input(s): PROBNP in the last 8760 hours. Coagulation Profile: No results for input(s): INR, PROTIME in the last 168 hours. Thyroid  Function Tests: No results for input(s): TSH, T4TOTAL, FREET4, T3FREE, THYROIDAB in the last 72 hours.  Lipid Profile: No results for  input(s): CHOL, HDL, LDLCALC, TRIG, CHOLHDL, LDLDIRECT in the last 72 hours. Anemia Panel: No results for input(s): VITAMINB12, FOLATE, FERRITIN, TIBC, IRON, RETICCTPCT in the last 72 hours. Urine analysis:    Component Value Date/Time   COLORURINE YELLOW 06/27/2023 0917   APPEARANCEUR CLEAR 06/27/2023 0917   LABSPEC 1.008 06/27/2023 0917   PHURINE 6.0 06/27/2023 0917   GLUCOSEU NEGATIVE 06/27/2023 0917   HGBUR SMALL (A) 06/27/2023 0917   BILIRUBINUR NEGATIVE 06/27/2023 0917   KETONESUR NEGATIVE 06/27/2023 0917   PROTEINUR NEGATIVE 06/27/2023 0917   UROBILINOGEN 0.2 10/10/2010 1220   NITRITE NEGATIVE 06/27/2023 0917   LEUKOCYTESUR NEGATIVE 06/27/2023 0917   Sepsis Labs: Invalid input(s): PROCALCITONIN, LACTICIDVEN  Microbiology: Recent Results (from the past 240 hours)  Culture, blood (routine x 2)     Status: None   Collection Time: 06/27/23  8:55 AM   Specimen: BLOOD RIGHT ARM  Result Value Ref Range Status   Specimen Description BLOOD RIGHT ARM  Final   Special Requests   Final    BOTTLES DRAWN AEROBIC AND ANAEROBIC Blood Culture results may not be optimal due to an inadequate volume of blood received in culture bottles   Culture   Final    NO GROWTH 5 DAYS Performed at [facility], [address], [place], [place] [address]    Report Status 07/02/2023 FINAL  Final  Culture, blood (routine x 2)     Status: None   Collection Time: 06/27/23 10:08 AM   Specimen: BLOOD RIGHT HAND  Result Value Ref Range Status   Specimen Description BLOOD RIGHT HAND  Final   Special Requests   Final    AEROBIC BOTTLE ONLY Blood Culture results may not be optimal due to an inadequate volume of blood received in culture bottles   Culture   Final    NO GROWTH 5 DAYS Performed at [facility], [address] [place], [place] [address]    Report Status 07/02/2023 FINAL  Final  Urine Culture     Status: None   Collection Time: 06/27/23  1:01 PM    Specimen: Urine, Catheterized  Result Value Ref Range Status   Specimen Description URINE, CATHETERIZED  Final   Special Requests NONE  Final  Culture   Final    NO GROWTH Performed at [facility], [address] [place], [place] [address]    Report Status 06/28/2023 FINAL  Final    Radiology Studies: No results found.     Jaeceon Michelin T. Arav Bannister [facility]  If 7PM-7AM, please contact night-coverage [email or web address] 07/02/2023, 2:54 PM

## 2023-07-02 NOTE — Progress Notes (Signed)
 Mobility Specialist Progress Note:    07/02/23 1000  Mobility  Activity Ambulated with assistance in hallway  Level of Assistance Contact guard assist, steadying assist  Assistive Device Front wheel walker  Distance Ambulated (ft) 150 ft  Activity Response Tolerated well  Mobility Referral Yes  Mobility visit 1 Mobility  Mobility Specialist Start Time (ACUTE ONLY) [id number]  Mobility Specialist Stop Time (ACUTE ONLY) 0930  Mobility Specialist Time Calculation (min) (ACUTE ONLY) 14 min   Received pt in bed having no complaints and agreeable to mobility. Pt was asymptomatic throughout ambulation and returned to room w/o fault. Left in chair w/ call bell in reach and all needs met. Chair alarm on.   D'Vante Nolon Baxter Mobility Specialist Please contact via Special educational needs teacher or Rehab office at [phone]

## 2023-07-02 NOTE — Progress Notes (Signed)
 Physical Therapy Treatment Patient Details Name: Steve Thompson MRN: [id number] DOB: 1931/03/26 Today's Date: 07/02/2023   History of Present Illness Pt is an 89 yo male who presented to [facility] ED after a fall from bed at home with unresponsiveness and noted head contusion following the fall. Pt also reporting urinary retention and not eating for a couple of days prior. Pt found to have acute urinary retention and AKI secondary to BPH. Pt also with elevated troponin and with noted growth in known lung nodule imaged on CT 6/11. PMH: CVA in 02/2023, postsurgical hypothyroidism, hypercalcemia, peripheral neuropathy, cognitive impairment, chronic back pain with chronic T12 and L1 compression fxs, HLD, SIRS    PT Comments  Pt seated in recliner with his wife at bedside.  He is very motivated to participate but reports feeling weak.  He had several failed attempts to transfer from back of recliner seat, he required cues to scoot forward and improve ease of transfer.  Focused on LE/UE strengthening post gt session.  Pt continues to benefit from short term rehab to improve strength and function before returning home.  Pt with history of falls and short term rehab could improve his condition before returning home.     If plan is discharge home, recommend the following: A little help with walking and/or transfers;Direct supervision/assist for medications management;Supervision due to cognitive status;Assist for transportation;Assistance with cooking/housework;A little help with bathing/dressing/bathroom;Help with stairs or ramp for entrance   Can travel by private vehicle     Yes  Equipment Recommendations  None recommended by PT    Recommendations for Other Services       Precautions / Restrictions Precautions Precautions: Fall Precaution/Restrictions Comments: reports multiple falls at home Restrictions Weight Bearing Restrictions Per Provider Order: No     Mobility  Bed Mobility                General bed mobility comments: Pt sitting in recliner at beginning and end of session    Transfers Overall transfer level: Needs assistance Equipment used: Rolling walker (2 wheels) Transfers: Sit to/from Stand Sit to Stand: Mod assist, Min assist (mod assistance from back of recliner with posterior lean.  Had patient return to seated position and move to edge of seat and transfer was much improved.)           General transfer comment: Cues for hand placement, scooting to edge of recliner, forward lean and hip extension to rise into standing.  Presents with poor eccentric load.    Ambulation/Gait Ambulation/Gait assistance: Contact guard assist, Min assist Gait Distance (Feet): 130 Feet Assistive device: Rolling walker (2 wheels) Gait Pattern/deviations: Step-through pattern, Decreased stride length, Trunk flexed       General Gait Details: Cues for RW position and upright posture.  He intially reports his legs feel weak but as gt progress he was able to increase distance.   Stairs             Wheelchair Mobility     Tilt Bed    Modified Rankin (Stroke Patients Only)       Balance Overall balance assessment: Needs assistance Sitting-balance support: Feet supported Sitting balance-Leahy Scale: Good       Standing balance-Leahy Scale: Poor Standing balance comment: UE support for balance                            Communication Communication Communication: Impaired Factors Affecting Communication: Hearing impaired  Cognition  Arousal: Alert Behavior During Therapy: WFL for tasks assessed/performed   PT - Cognitive impairments: Memory, Safety/Judgement                       PT - Cognition Comments: oriented x 4, worried about his urine amounts Following commands: Intact      Cueing Cueing Techniques: Verbal cues, Visual cues  Exercises General Exercises - Lower Extremity Quad Sets: AROM, Both, 10 reps, Supine Long Arc Quad:  AROM, Both, 10 reps, Seated Hip ABduction/ADduction: AROM, Both, 10 reps, Supine Straight Leg Raises: AAROM, Both, 10 reps, Supine Hip Flexion/Marching: AROM, Both, 10 reps, Seated Other Exercises Other Exercises: B UE flex/ext x 10 reps. Other Exercises: B bicep curls with 10 oz water bottle x 10 reps.    General Comments        Pertinent Vitals/Pain Pain Assessment Pain Assessment: No/denies pain    Home Living                          Prior Function            PT Goals (current goals can now be found in the care plan section) Acute Rehab PT Goals Patient Stated Goal: return to independent, stop falling Potential to Achieve Goals: Fair Progress towards PT goals: Progressing toward goals    Frequency    Min 2X/week      PT Plan      Co-evaluation              AM-PAC PT 6 Clicks Mobility   Outcome Measure  Help needed turning from your back to your side while in a flat bed without using bedrails?: A Little Help needed moving from lying on your back to sitting on the side of a flat bed without using bedrails?: A Little Help needed moving to and from a bed to a chair (including a wheelchair)?: A Little Help needed standing up from a chair using your arms (e.g., wheelchair or bedside chair)?: A Little Help needed to walk in hospital room?: A Little Help needed climbing 3-5 steps with a railing? : Total 6 Click Score: 16    End of Session Equipment Utilized During Treatment: Gait belt Activity Tolerance: Patient tolerated treatment well Patient left: in chair;with call bell/phone within reach;with family/visitor present   PT Visit Diagnosis: Other abnormalities of gait and mobility (R26.89);History of falling (Z91.81);Repeated falls (R29.6);Other symptoms and signs involving the nervous system (R29.898)     Time: 5409-8119 PT Time Calculation (min) (ACUTE ONLY): 29 min  Charges:    $Gait Training: 8-22 mins $Therapeutic Exercise: 8-22  mins PT General Charges $$ ACUTE PT VISIT: 1 Visit                     Beulah Brunt , PTA Acute Rehabilitation Services Office [phone]    Steve Thompson 07/02/2023, 1:06 PM

## 2023-07-03 DIAGNOSIS — W19XXXA Unspecified fall, initial encounter: Secondary | ICD-10-CM | POA: Diagnosis not present

## 2023-07-03 DIAGNOSIS — N179 Acute kidney failure, unspecified: Secondary | ICD-10-CM | POA: Diagnosis not present

## 2023-07-03 DIAGNOSIS — R6510 Systemic inflammatory response syndrome (SIRS) of non-infectious origin without acute organ dysfunction: Secondary | ICD-10-CM | POA: Diagnosis not present

## 2023-07-03 DIAGNOSIS — R531 Weakness: Secondary | ICD-10-CM | POA: Diagnosis not present

## 2023-07-03 MED ORDER — GABAPENTIN 300 MG PO CAPS: ORAL_CAPSULE | ORAL | 0 refills | Status: DC

## 2023-07-03 MED ORDER — ORAL CARE MOUTH RINSE
15.0000 mL | OROMUCOSAL | Status: DC | PRN
Start: 2023-07-03 — End: 2023-07-09

## 2023-07-03 NOTE — Progress Notes (Signed)
 PROGRESS NOTE  Steve Thompson OZH:[id number] DOB: Sep 16, 1931   PCP: [name], MD  Patient is from: Home.  DOA: 06/27/2023 LOS: 6  Chief complaints Chief Complaint  Patient presents with   Hypotension     Brief Narrative / Interim history: 89 year old M with PMH of cognitive impairment, myasthenia gravis, frequent falls, hyperparathyroidism, chronic back pain, BPH, hypercalcemia and neuropathy presenting with generalized weakness, poor p.o. intake and fall at home.  Reportedly, he was found down on the floor by his wife barely able to talk the morning of his presentation.  He has been experiencing urinary retention with dribbling and inability to urinate effectively for couple of days.  He was scheduled to see urologist before presenting to the hospital.   In ED, stable vitals.  WBC 16.5. Cr 2.9.  BUN 24.  Troponin 22.  CK1 102.  CXR and UA without significant finding.  CT head showed small left scalp soft tissue contusion without any acute intracranial abnormality.  CT chest, abdomen and pelvis noted a large superior segment of left lower lobe lung nodule since last year measuring 1.7 cm concerning for bronchogenic carcinoma, chronic T12 and L1 compression fractures and abnormal urinary bladder wall thickening despite decompression by Foley catheter.  Foley catheter inserted with return of 900 cc of urine.  Patient received IV fluid and empirically started on IV ceftriaxone .    Sepsis ruled out.  Antibiotics discontinued.  AKI resolved.  Patient is discharged with Foley catheter until outpatient follow-up with urology for voiding trial and further evaluation.   TTE with LVEF of 65 to 70%, moderate asymmetric LVH, indeterminate DD.  Therapy recommended SNF but insurance declined.  Peer to peer review started and waiting on callback from insurance representative, Bartholomew Light.    Subjective: Seen and examined earlier this morning.  No major events overnight or this morning.  No  complaints.  Objective: Vitals:   07/02/23 1432 07/02/23 2024 07/03/23 0557 07/03/23 0752  BP: (!) 111/54 (!) 129/55 131/63 107/66  Pulse: 95 (!) 101 91 92  Resp: 16 18 17    Temp: 98.7 F (37.1 C) 98.5 F (36.9 C) 98.1 F (36.7 C) 98.5 F (36.9 C)  TempSrc:  Oral Oral   SpO2: 93% 92% 92% 91%  Weight:      Height:        Examination:  GENERAL: No apparent distress.  Nontoxic. HEENT: MMM.  Vision and hearing grossly intact.  NECK: Supple.  No apparent JVD.  RESP:  No IWOB.  Fair aeration bilaterally. CVS:  RRR. Heart sounds normal.  ABD/GI/GU: BS+. Abd soft, NTND.  Foley catheter in place. MSK/EXT:  Moves extremities. No apparent deformity. No edema.  SKIN: no apparent skin lesion or wound NEURO: AA.  Oriented x 4 except date.  No apparent focal neuro deficit. PSYCH: Calm. Normal affect.   Consultants:  None  Procedures: None  Microbiology summarized: Urine culture NGTD Blood culture NGTD  Assessment and plan: Acute kidney injury secondary to BPH with urinary retention: Resolving.  CT shows abnormal urinary bladder wall thickening despite decompression.  UA does not suggest UTI.  Urine culture NGTD. Recent Labs    08/23/22 1032 02/25/23 1420 02/25/23 1426 02/27/23 0440 06/27/23 0855 06/27/23 0908 06/28/23 0043 06/29/23 0639 06/30/23 1009  BUN 17 13 15 8  24* 24* 17 7* 8  CREATININE 0.95 0.87 0.80 0.85 2.95* 2.90* 1.19 0.78 0.72  - Continue Foley catheter until outpatient follow-up with urology  Acute urinary retention/BPH: 900 cc urine output  after inserting Foley - Continue Foley catheter until outpatient follow-up with urology - Continue Flomax   Elevated troponin/BNP: Troponin trended from 22 and peaked up at 328 and trended down.  Patient denies cardiac symptoms although not a great historian.  TTE without significant finding.  Could be delayed clearance from AKI. -Continue home Plavix  and Crestor   SIRS: Likely reactive.  UA, urine culture and blood  culture NGTD.  SIRS resolved.  Sepsis ruled out.  Fall at home/head contusion/generalized weakness -Fall precaution, PT/OT  Lung nodule: CT chest showed enlarged 1.7 cm LLL nodule concerning for bronchogenic carcinoma. -Pulmonology consulted and will arrange outpatient follow-up   Hyponatremia: Likely hypovolemic from poor p.o. intake.  Resolved. - Recheck in the morning   History of CVA: Had right nonhemorrhagic thalamocapsular ischemic stroke back in 02/2023.   -Continue Plavix  and statin   Postsurgical hypothyroidism: TSH normal - Continue home Synthroid    Compression fracture: Chronic T12 and L1 compression fractures noted on CT. pathology?   Cognitive impairment: -Reorientation and delirium precautions  History of ocular myasthenia gravis: Followed by neurology.  Per last neurology note in 02/2023, supposed to be on Mestinon  30 mg 3 times daily as needed.  Stable. -Outpatient follow-up with neurology.   Hyperlipidemia - Continue Crestor   Hypokalemia/hypomagnesemia - Monitor replenish as appropriate  Disposition: Therapy recommended SNF but insurance denied.  Call for P2P review and left voicemail for Bartholomew Light, NP @[phone].  Waiting on callback.  Body mass index is 27.35 kg/m.           DVT prophylaxis:  heparin  injection 5,000 Units Start: 06/27/23 1400  Code Status: Full code Family Communication: None at bedside.  Attempted to call patient's wife for update but no answer. Level of care: Telemetry Medical Status is: Inpatient Remains inpatient appropriate because: Safe disposition.   Final disposition: SNF   35 minutes with more than 50% spent in reviewing records, counseling patient/family and coordinating care.   Sch Meds:  Scheduled Meds:  Chlorhexidine  Gluconate Cloth  6 each Topical Daily   clopidogrel   75 mg Oral Daily   gabapentin   300 mg Oral BID   heparin   5,000 Units Subcutaneous Q8H   levothyroxine   100 mcg Oral Daily   loratadine   10  mg Oral Daily   rosuvastatin   20 mg Oral Daily   sodium chloride  flush  3 mL Intravenous Q12H   tamsulosin   0.8 mg Oral Daily   Continuous Infusions: PRN Meds:.acetaminophen  **OR** acetaminophen , albuterol , mouth rinse  Antimicrobials: Anti-infectives (From admission, onward)    Start     Dose/Rate Route Frequency Ordered Stop   06/27/23 1130  cefTRIAXone  (ROCEPHIN ) 1 g in sodium chloride  0.9 % 100 mL IVPB        1 g 200 mL/hr over 30 Minutes Intravenous  Once 06/27/23 1128 06/27/23 1250        I have personally reviewed the following labs and images: CBC: Recent Labs  Lab 06/27/23 0855 06/27/23 0908 06/28/23 0043 06/29/23 0639  WBC 16.5*  --  10.0 9.3  NEUTROABS 13.3*  --   --   --   HGB 13.2 13.3 12.2* 13.3  HCT 39.0 39.0 35.8* 39.0  MCV 89.4  --  87.5 87.8  PLT 273  --  229 258   BMP &GFR Recent Labs  Lab 06/27/23 0855 06/27/23 0908 06/28/23 0043 06/29/23 0639 06/30/23 1009  NA 133* 133* 138 139 136  K 3.8 3.6 3.5 2.9* 3.8  CL 100 99 105 101 102  CO2  23  --  24 26 25   GLUCOSE 127* 124* 101* 102* 128*  BUN 24* 24* 17 7* 8  CREATININE 2.95* 2.90* 1.19 0.78 0.72  CALCIUM  10.1  --  9.4 9.0 9.0  MG  --   --   --  1.5* 1.7  PHOS  --   --   --  2.7 2.8   Estimated Creatinine Clearance: 62.6 mL/min (by C-G formula based on SCr of 0.72 mg/dL). Liver & Pancreas: Recent Labs  Lab 06/27/23 0855 06/29/23 0639 06/30/23 1009  AST 28  --   --   ALT 16  --   --   ALKPHOS 50  --   --   BILITOT 1.0  --   --   PROT 5.6*  --   --   ALBUMIN 3.4* 3.3* 3.2*   No results for input(s): LIPASE, AMYLASE in the last 168 hours. No results for input(s): AMMONIA in the last 168 hours. Diabetic: No results for input(s): HGBA1C in the last 72 hours. No results for input(s): GLUCAP in the last 168 hours. Cardiac Enzymes: Recent Labs  Lab 06/27/23 0855  CKTOTAL 102   No results for input(s): PROBNP in the last 8760 hours. Coagulation Profile: No results for  input(s): INR, PROTIME in the last 168 hours. Thyroid  Function Tests: No results for input(s): TSH, T4TOTAL, FREET4, T3FREE, THYROIDAB in the last 72 hours.  Lipid Profile: No results for input(s): CHOL, HDL, LDLCALC, TRIG, CHOLHDL, LDLDIRECT in the last 72 hours. Anemia Panel: No results for input(s): VITAMINB12, FOLATE, FERRITIN, TIBC, IRON, RETICCTPCT in the last 72 hours. Urine analysis:    Component Value Date/Time   COLORURINE YELLOW 06/27/2023 0917   APPEARANCEUR CLEAR 06/27/2023 0917   LABSPEC 1.008 06/27/2023 0917   PHURINE 6.0 06/27/2023 0917   GLUCOSEU NEGATIVE 06/27/2023 0917   HGBUR SMALL (A) 06/27/2023 0917   BILIRUBINUR NEGATIVE 06/27/2023 0917   KETONESUR NEGATIVE 06/27/2023 0917   PROTEINUR NEGATIVE 06/27/2023 0917   UROBILINOGEN 0.2 10/10/2010 1220   NITRITE NEGATIVE 06/27/2023 0917   LEUKOCYTESUR NEGATIVE 06/27/2023 0917   Sepsis Labs: Invalid input(s): PROCALCITONIN, LACTICIDVEN  Microbiology: Recent Results (from the past 240 hours)  Culture, blood (routine x 2)     Status: None   Collection Time: 06/27/23  8:55 AM   Specimen: BLOOD RIGHT ARM  Result Value Ref Range Status   Specimen Description BLOOD RIGHT ARM  Final   Special Requests   Final    BOTTLES DRAWN AEROBIC AND ANAEROBIC Blood Culture results may not be optimal due to an inadequate volume of blood received in culture bottles   Culture   Final    NO GROWTH 5 DAYS Performed at [facility], [address] [place], [place] [address]    Report Status 07/02/2023 FINAL  Final  Culture, blood (routine x 2)     Status: None   Collection Time: 06/27/23 10:08 AM   Specimen: BLOOD RIGHT HAND  Result Value Ref Range Status   Specimen Description BLOOD RIGHT HAND  Final   Special Requests   Final    AEROBIC BOTTLE ONLY Blood Culture results may not be optimal due to an inadequate volume of blood received in culture bottles   Culture   Final    NO  GROWTH 5 DAYS Performed at [facility], [address], [place], [place] [address]    Report Status 07/02/2023 FINAL  Final  Urine Culture     Status: None  Collection Time: 06/27/23  1:01 PM   Specimen: Urine, Catheterized  Result Value Ref Range Status   Specimen Description URINE, CATHETERIZED  Final   Special Requests NONE  Final   Culture   Final    NO GROWTH Performed at [facility], [address], [place], [place] [address]    Report Status 06/28/2023 FINAL  Final    Radiology Studies: No results found.     Dellis Voght T. Shelena Castelluccio [facility]  If 7PM-7AM, please contact night-coverage [email or web address] 07/03/2023, 3:27 PM

## 2023-07-03 NOTE — Plan of Care (Signed)
   Problem: Health Behavior/Discharge Planning: Goal: Ability to manage health-related needs will improve Outcome: Progressing

## 2023-07-03 NOTE — Progress Notes (Signed)
 Mobility Specialist Progress Note:    07/03/23 1416  Mobility  Activity Ambulated with assistance in hallway  Level of Assistance Contact guard assist, steadying assist  Assistive Device Front wheel walker  Distance Ambulated (ft) 120 ft  Activity Response Tolerated well  Mobility Referral Yes  Mobility visit 1 Mobility  Mobility Specialist Start Time (ACUTE ONLY) 1357  Mobility Specialist Stop Time (ACUTE ONLY) 1412  Mobility Specialist Time Calculation (min) (ACUTE ONLY) 15 min   Pt received in chair, agreeable to mobility. Pt present w/ posterior lean upon standing required ModA to stand. Ambulated w/ CG and c/o of some BLE pain. Returned to chair w/o fault. Chair alarm on. Personal belongings and call light within reach. All needs met. Family present.   Doak Free Mobility Specialist  Please contact via [employer] or  Rehab Office [phone]

## 2023-07-03 NOTE — Plan of Care (Signed)
    Referral received for Steve Thompson :goals of care discussion. Chart reviewed.  I was able to speak with patient's daughter Steve Thompson and spouse Steve Thompson. GOC meeting scheduled for 6/18 @ 10:30 AM.  Spouse is aware we will meet at patient's bedside.  I will call daughter (she lives in [place] ) to participate when I arrive to the bedside.  Detailed note and recommendations to follow once GOC has been completed.   Thank you for your referral and allowing PMT to assist in Mr. Steve Thompson's care.    Steve Maul, PA-C Palliative Medicine Team  Team Phone # [phone]   NO CHARGE

## 2023-07-03 NOTE — Plan of Care (Signed)
  Problem: Clinical Measurements: Goal: Ability to maintain clinical measurements within normal limits will improve Outcome: Progressing Goal: Will remain free from infection Outcome: Progressing Goal: Diagnostic test results will improve Outcome: Progressing Goal: Respiratory complications will improve Outcome: Progressing Goal: Cardiovascular complication will be avoided Outcome: Progressing   Problem: Coping: Goal: Level of anxiety will decrease Outcome: Progressing   Problem: Pain Managment: Goal: General experience of comfort will improve and/or be controlled Outcome: Progressing   Problem: Safety: Goal: Ability to remain free from injury will improve Outcome: Progressing

## 2023-07-03 NOTE — TOC Progression Note (Signed)
 Transition of Care [facility]) - Progression Note    Patient Details  Name: Steve Thompson MRN: [id number] Date of Birth: 07-28-1931  [facility] of [facility] CM/SW Contact  Elspeth Hals, LCSW Phone Number: 07/03/2023, 12:06 PM  Clinical Narrative:   TC Tammy/HTA: SNF auth offering P2P by 1700.  MD call Bartholomew Light, NP, [phone].  MD informed.     Expected Discharge Plan: Skilled Nursing Facility Barriers to Discharge: Continued Medical Work up, SNF Pending bed offer  Expected Discharge Plan and Services In-house Referral: Clinical Social Work   Post Acute Care Choice: Skilled Nursing Facility Living arrangements for the past 2 months: Single Family Home Expected Discharge Date: 07/02/23                                     Social Determinants of Health (SDOH) Interventions SDOH Screenings   Food Insecurity: Patient Declined (06/28/2023)  Housing: Low Risk  (06/28/2023)  Transportation Needs: No Transportation Needs (06/28/2023)  Utilities: Patient Declined (06/28/2023)  Social Connections: Unknown (06/28/2023)  Tobacco Use: Medium Risk (06/27/2023)    Readmission Risk Interventions     No data to display

## 2023-07-03 NOTE — Progress Notes (Signed)
 Physical Therapy Treatment Patient Details Name: Steve Thompson MRN: [id number] DOB: [birth year] Today's Date: 07/03/2023   History of Present Illness Pt is an 89 yo male who presented to [facility] ED after a fall from bed at home with unresponsiveness and noted head contusion following the fall. Pt also reporting urinary retention and not eating for a couple of days prior. Pt found to have acute urinary retention and AKI secondary to BPH. Pt also with elevated troponin and with noted growth in known lung nodule imaged on CT 6/11. PMH: CVA in 02/2023, postsurgical hypothyroidism, hypercalcemia, peripheral neuropathy, cognitive impairment, chronic back pain with chronic T12 and L1 compression fxs, HLD, SIRS    PT Comments  Pt supine in bed on arrival.  He reports he is ready to move and eager to participate.  Focused on LE strengthening and gt training.  He continues to present with mild instability during gt training particularly with turns and backing.  He continues to benefit from rehab in a post acute setting to improve strength and function before returning home to spouse.  Pt continues to improve at a slower pace.     If plan is discharge home, recommend the following: A little help with walking and/or transfers;Direct supervision/assist for medications management;Supervision due to cognitive status;Assist for transportation;Assistance with cooking/housework;A little help with bathing/dressing/bathroom;Help with stairs or ramp for entrance   Can travel by private vehicle     Yes  Equipment Recommendations  None recommended by PT    Recommendations for Other Services       Precautions / Restrictions Precautions Precautions: Fall Precaution/Restrictions Comments: reports multiple falls at home Restrictions Weight Bearing Restrictions Per Provider Order: No     Mobility  Bed Mobility Overal bed mobility: Needs Assistance Bed Mobility: Supine to Sit     Supine to sit: Supervision (bed  flat)     General bed mobility comments: Increased time and effort but able to rise into sitting at edge of bed without assistance this session.    Transfers Overall transfer level: Needs assistance Equipment used: Rolling walker (2 wheels) Transfers: Sit to/from Stand Sit to Stand: Min assist           General transfer comment: Cues for hand placement and scooting to edge of the recliner.  He had one failed attempt to stand when sitting back further on the bed.  With cueing he required decreased assistance and able to follow commands for forward weight shifting or anterior translation.    Ambulation/Gait Ambulation/Gait assistance: Contact guard assist, Min assist Gait Distance (Feet): 100 Feet Assistive device: Rolling walker (2 wheels) Gait Pattern/deviations: Step-through pattern, Decreased stride length, Trunk flexed (decrease foot clearance with foot flat contact when fatigued.)       General Gait Details: Cues for RW safety. posture, increased step height and increased stride length. Pt with mild instability during turns requiring increased assistance with turns and backing.   Stairs             Wheelchair Mobility     Tilt Bed    Modified Rankin (Stroke Patients Only)       Balance Overall balance assessment: Needs assistance Sitting-balance support: Feet supported Sitting balance-Leahy Scale: Good       Standing balance-Leahy Scale: Poor Standing balance comment: UE support for balance                            Communication Communication Communication: Impaired Factors  Affecting Communication: Hearing impaired  Cognition Arousal: Alert Behavior During Therapy: WFL for tasks assessed/performed                           PT - Cognition Comments: oriented x 4, worried about his urine amounts and colors. Following commands: Intact      Cueing Cueing Techniques: Verbal cues, Visual cues  Exercises General Exercises -  Lower Extremity Ankle Circles/Pumps: AROM, Both, 10 reps, Supine Quad Sets: AROM, Both, 10 reps, Supine Hip ABduction/ADduction: AROM, Both, 10 reps, Supine Straight Leg Raises: Both, 10 reps, Supine, AROM Other Exercises Other Exercises: Supine bridge x 5 reps ( reports mild back pain so did not perform to 10 reps)    General Comments        Pertinent Vitals/Pain Pain Assessment Pain Assessment: Faces Faces Pain Scale: Hurts little more Pain Location: L foot ( toe bruised ) Pain Descriptors / Indicators: Sore, Discomfort Pain Intervention(s): Monitored during session, Repositioned    Home Living                          Prior Function            PT Goals (current goals can now be found in the care plan section) Acute Rehab PT Goals Patient Stated Goal: return to independent, stop falling Potential to Achieve Goals: Fair Progress towards PT goals: Progressing toward goals    Frequency    Min 2X/week      PT Plan      Co-evaluation              AM-PAC PT 6 Clicks Mobility   Outcome Measure  Help needed turning from your back to your side while in a flat bed without using bedrails?: A Little Help needed moving from lying on your back to sitting on the side of a flat bed without using bedrails?: A Little Help needed moving to and from a bed to a chair (including a wheelchair)?: A Little Help needed standing up from a chair using your arms (e.g., wheelchair or bedside chair)?: A Little Help needed to walk in hospital room?: A Little Help needed climbing 3-5 steps with a railing? : Total 6 Click Score: 16    End of Session Equipment Utilized During Treatment: Gait belt Activity Tolerance: Patient tolerated treatment well Patient left: in chair;with call bell/phone within reach;with family/visitor present;with chair alarm set Nurse Communication: Mobility status PT Visit Diagnosis: Other abnormalities of gait and mobility (R26.89);History of  falling (Z91.81);Repeated falls (R29.6);Other symptoms and signs involving the nervous system (R29.898)     Time: 1324-4010 PT Time Calculation (min) (ACUTE ONLY): 20 min  Charges:    $Therapeutic Activity: 8-22 mins PT General Charges $$ ACUTE PT VISIT: 1 Visit                     Steve Thompson , PTA Acute Rehabilitation Services Office [phone]    Steve Thompson 07/03/2023, 9:25 AM

## 2023-07-04 DIAGNOSIS — W19XXXA Unspecified fall, initial encounter: Secondary | ICD-10-CM | POA: Diagnosis not present

## 2023-07-04 DIAGNOSIS — Z515 Encounter for palliative care: Secondary | ICD-10-CM

## 2023-07-04 DIAGNOSIS — R4189 Other symptoms and signs involving cognitive functions and awareness: Secondary | ICD-10-CM | POA: Diagnosis not present

## 2023-07-04 DIAGNOSIS — Z7189 Other specified counseling: Secondary | ICD-10-CM

## 2023-07-04 DIAGNOSIS — N179 Acute kidney failure, unspecified: Secondary | ICD-10-CM | POA: Diagnosis not present

## 2023-07-04 DIAGNOSIS — R531 Weakness: Secondary | ICD-10-CM | POA: Diagnosis not present

## 2023-07-04 DIAGNOSIS — N189 Chronic kidney disease, unspecified: Secondary | ICD-10-CM | POA: Diagnosis not present

## 2023-07-04 NOTE — Progress Notes (Signed)
 [facility] PROGRESS NOTE    Progress Note  Steve Thompson  ZHY:[id number] DOB: 01/13/1932 DOA: 06/27/2023 PCP: [name], MD     Brief Narrative:   Steve Thompson is an 89 y.o. male past medical history of cognitive impairment, myasthenia gravis previous status hypoparathyroidism, back pain BPH hypercalcemia presents to the hospital with generalized weakness poor oral intake and a fall at home, was found down by his wife on the day of admission.  He then talk, she relates he has been having urinary retention with dribbling and inability urinate for couple days.  He was seen by his just was consulted.  Foley and follow-up with them as an outpatient.   Assessment/Plan:   Acute kidney injury superimposed on chronic kidney disease likely secondary to BPH leading to urinary retention: Urology was consulted and Foley was placed will follow urology as an outpatient. Continue Flomax .  Elevated troponin/BNP: 2D echo showed no acute findings likely delayed clearing from acute kidney injury. Continue Plavix  and Crestor .  SIRS: Likely reactive sepsis being ruled out.  Generalized weakness: PT OT consulted  Incidental lung nodule: CT of the chest showed 1.7 cm left lower lobe nodule. Pulmonary consulted will follow-up as an outpatient.  Hypovolemic hyponatremia: Resolved.  History of CVA: Continue Plavix  and statins.  Iatrogenic hypothyroidism: Continue Synthroid .  Chronic compression fracture T12 and L1: Noted.  Cognitive impairment: Noted.  History of ocular myasthenia gravis: Continue Mestinon  3 times a day follow-up with neurology as an outpatient.  Hyperlipidemia: Continue Crestor .  Hypokalemia/hypomagnesemia: Repleted now resolved.  Disposition PT OT evaluated the patient awaiting skilled nursing facility placement but insurance denied. Peer-to-peer done awaiting callback.  Goals of care: Palliative care was consulted and having a meeting today  at 1030.  DVT prophylaxis: lovenox  Family Communication:none Status is: Inpatient Remains inpatient appropriate because: Acute kidney injury    Code Status:     Code Status Orders  (From admission, onward)           Start     Ordered   06/27/23 1213  Full code  Continuous       Question:  By:  Answer:  Consent: discussion documented in EHR   06/27/23 1213           Code Status History     Date Active Date Inactive Code Status Order ID Comments User Context   02/25/2023 1606 02/28/2023 2201 Full Code [id number]  Oral Billings, MD ED         IV Access:   Peripheral IV   Procedures and diagnostic studies:   No results found.   Medical Consultants:   None.   Subjective:    Carroll Clamp no complaints.  Objective:    Vitals:   07/03/23 1541 07/03/23 2014 07/04/23 0433 07/04/23 0753  BP: (!) 117/54 110/61 117/70 115/65  Pulse: 82 94 83 85  Resp:  15 16 16   Temp: 98.9 F (37.2 C) 99.4 F (37.4 C) 98 F (36.7 C) 97.9 F (36.6 C)  TempSrc: Oral     SpO2: 90% 91% 94% 94%  Weight:      Height:       SpO2: 94 % O2 Flow Rate (L/min): 4 L/min  No intake or output data in the 24 hours ending 07/04/23 0944 Filed Weights   06/27/23 0845  Weight: 84 kg    Exam: General exam: In no acute distress. Respiratory system: Good air movement and clear to auscultation. Cardiovascular system: S1 &  S2 heard, RRR. No JVD. Gastrointestinal system: Abdomen is nondistended, soft and nontender.  Extremities: No pedal edema. Skin: No rashes, lesions or ulcers Psychiatry: Judgement and insight appear normal. Mood & affect appropriate.    Data Reviewed:    Labs: Basic Metabolic Panel: Recent Labs  Lab 06/28/23 0043 06/29/23 0639 06/30/23 1009  NA 138 139 136  K 3.5 2.9* 3.8  CL 105 101 102  CO2 24 26 25   GLUCOSE 101* 102* 128*  BUN 17 7* 8  CREATININE 1.19 0.78 0.72  CALCIUM  9.4 9.0 9.0  MG  --  1.5* 1.7  PHOS  --  2.7 2.8    GFR Estimated Creatinine Clearance: 62.6 mL/min (by C-G formula based on SCr of 0.72 mg/dL). Liver Function Tests: Recent Labs  Lab 06/29/23 0639 06/30/23 1009  ALBUMIN 3.3* 3.2*   No results for input(s): LIPASE, AMYLASE in the last 168 hours. No results for input(s): AMMONIA in the last 168 hours. Coagulation profile No results for input(s): INR, PROTIME in the last 168 hours. COVID-19 Labs  No results for input(s): DDIMER, FERRITIN, LDH, CRP in the last 72 hours.  No results found for: SARSCOV2NAA  CBC: Recent Labs  Lab 06/28/23 0043 06/29/23 0639  WBC 10.0 9.3  HGB 12.2* 13.3  HCT 35.8* 39.0  MCV 87.5 87.8  PLT 229 258   Cardiac Enzymes: No results for input(s): CKTOTAL, CKMB, CKMBINDEX, TROPONINI in the last 168 hours. BNP (last 3 results) No results for input(s): PROBNP in the last 8760 hours. CBG: No results for input(s): GLUCAP in the last 168 hours. D-Dimer: No results for input(s): DDIMER in the last 72 hours. Hgb A1c: No results for input(s): HGBA1C in the last 72 hours. Lipid Profile: No results for input(s): CHOL, HDL, LDLCALC, TRIG, CHOLHDL, LDLDIRECT in the last 72 hours. Thyroid  function studies: No results for input(s): TSH, T4TOTAL, T3FREE, THYROIDAB in the last 72 hours.  Invalid input(s): FREET3 Anemia work up: No results for input(s): VITAMINB12, FOLATE, FERRITIN, TIBC, IRON, RETICCTPCT in the last 72 hours. Sepsis Labs: Recent Labs  Lab 06/28/23 0043 06/29/23 0639  WBC 10.0 9.3   Microbiology Recent Results (from the past 240 hours)  Culture, blood (routine x 2)     Status: None   Collection Time: 06/27/23  8:55 AM   Specimen: BLOOD RIGHT ARM  Result Value Ref Range Status   Specimen Description BLOOD RIGHT ARM  Final   Special Requests   Final    BOTTLES DRAWN AEROBIC AND ANAEROBIC Blood Culture results may not be optimal due to an inadequate volume of blood  received in culture bottles   Culture   Final    NO GROWTH 5 DAYS Performed at [facility], [address], [place], [place] [address]    Report Status 07/02/2023 FINAL  Final  Culture, blood (routine x 2)     Status: None   Collection Time: 06/27/23 10:08 AM   Specimen: BLOOD RIGHT HAND  Result Value Ref Range Status   Specimen Description BLOOD RIGHT HAND  Final   Special Requests   Final    AEROBIC BOTTLE ONLY Blood Culture results may not be optimal due to an inadequate volume of blood received in culture bottles   Culture   Final    NO GROWTH 5 DAYS Performed at [facility], [address], [place], [place] [address]    Report Status 07/02/2023 FINAL  Final  Urine Culture     Status: None  Collection Time: 06/27/23  1:01 PM   Specimen: Urine, Catheterized  Result Value Ref Range Status   Specimen Description URINE, CATHETERIZED  Final   Special Requests NONE  Final   Culture   Final    NO GROWTH Performed at [facility], [address], [place], [place] [address]    Report Status 06/28/2023 FINAL  Final     Medications:    Chlorhexidine  Gluconate Cloth  6 each Topical Daily   clopidogrel   75 mg Oral Daily   gabapentin   300 mg Oral BID   heparin   5,000 Units Subcutaneous Q8H   levothyroxine   100 mcg Oral Daily   loratadine   10 mg Oral Daily   rosuvastatin   20 mg Oral Daily   sodium chloride  flush  3 mL Intravenous Q12H   tamsulosin   0.8 mg Oral Daily   Continuous Infusions:    LOS: 7 days   Macdonald Savoy  [facility]  07/04/2023, 9:44 AM

## 2023-07-04 NOTE — TOC Progression Note (Signed)
 Transition of Care [facility]) - Progression Note    Patient Details  Name: HUMZA TALLERICO MRN: [id number] Date of Birth: July 15, 1931  [facility] CM/SW Contact  Elspeth Hals, LCSW Phone Number: 07/04/2023, 12:25 PM  Clinical Narrative:    CSW spoke with [employer].  They could accept private pay: $300/day on LTC side, $425/day on STR side. Would need 30 days up front.  Email from Antonio/Financial Counseling: he will meet with pt wife today for medicaid screening.    CSW spoke with pt daughter Alvia Awkward, who does want to pursue appeal with [employer]. Update her on private pay at [facility].   Denial letter received from [employer], brought to pt and wife, assisted them making appeal by phone.  72 hour time frame, per [employer].    Expected Discharge Plan: Skilled Nursing Facility Barriers to Discharge: Continued Medical Work up, SNF Pending bed offer  Expected Discharge Plan and Services In-house Referral: Clinical Social Work   Post Acute Care Choice: Skilled Nursing Facility Living arrangements for the past 2 months: Single Family Home Expected Discharge Date: 07/03/23                                     Social Determinants of Health (SDOH) Interventions SDOH Screenings   Food Insecurity: Patient Declined (06/28/2023)  Housing: Low Risk  (06/28/2023)  Transportation Needs: No Transportation Needs (06/28/2023)  Utilities: Patient Declined (06/28/2023)  Social Connections: Unknown (06/28/2023)  Tobacco Use: Medium Risk (06/27/2023)    Readmission Risk Interventions     No data to display

## 2023-07-04 NOTE — Consult Note (Signed)
 Consultation Note Date: 07/04/2023   Patient Name: Steve Thompson  DOB: 1931-01-25  MRN: [id number]  Age / Sex: 89 y.o., male  PCP: [name], MD Referring Physician: Versa Gore, Fortunato Ill, MD  Reason for Consultation: Establishing goals of care  HPI/Patient Profile: 89 y.o. male  with past medical history of hyperparathyroidism, hypercalcemia, peripheral neuropathy, arthritis, cognitive impairment, myasthenia gravis BPH, chronic back pain admitted on 06/27/2023 with generalized weakness, poor oral intake, fall.   Patient was found down at home by his wife.  He had urinary retention for couple days prior to this.  In the ED, CT chest, abdomen and pelvis noted a large superior segment of left lower lobe lung nodule since last year measuring 1.7 cm concerning for bronchogenic carcinoma.  Foley catheter was placed.  He was admitted with AKI, acute urinary retention, SIRS criteria.  PMT has been consulted to assist with goals of care conversation.  Clinical Assessment and Goals of Care:  I have reviewed medical records including EPIC notes, labs and imaging, discussed with RN and PT, assessed the patient and then met at the bedside with patient's wife Nellie Banas and daughter Cal via conference call to discuss diagnosis prognosis, GOC, EOL wishes, disposition and options.  I introduced Palliative Medicine as specialized medical care for people living with serious illness. It focuses on providing relief from the symptoms and stress of a serious illness. The goal is to improve quality of life for both the patient and the family.  We discussed a brief life review of the patient and then focused on their current illness.  The natural disease trajectory and expectations at EOL were discussed.  I attempted to elicit values and goals of care important to the patient.    Medical History Review and Understanding:  We discussed patient's acute illness in the context of  their chronic comorbidities.  Patient's family understand the severity of patient's illness.  Provided education on the impact of cognitive disturbances and patient's overall debility, impulsivity, risk factors.  Social History: Patient and his wife have been married 15 years and just celebrated an anniversary. He has a son and daughter who live out of state. They previously enjoyed trips and cruises but have not been able lately.  He enjoys watching games shows at home.  Functional and Nutritional State: Patient has had cognitive decline since even before his stroke in February. He normally has a great appetite. He has been using a walker this admission. Wife reports cane and walker at home but he did not like to use them.  Advance Directives: A detailed discussion regarding advanced directives was had. Patient would like to name wife as primary HCPOA, daughter as secondary HCPOA, and son as third HCPOA.  Code Status: Concepts specific to code status, artifical feeding and hydration, and rehospitalization were considered and discussed.  MOST form was introduced.  Discussion: During my conversation with patient's daughter, she shares that she has been trying to encourage GOC discussions for some time. Patient's wife also independently shares that it has been hard to convince Ethelle Herb to participate in these discussions. She has a living will herself and would like for him to get one too. Met together with daughter on the phone and created space and opportunity for patient and family's thoughts and feelings on patient's current illness. He feels that he has lived a long and happy life and he is at peace with whatever happens, whether he can rehabilitate and improve mobility/safety or whether he continues to  fall and have worse problems with his health. His 89th birthday is coming up this month and he is ready for whenever God states it is his time. He shares that he strongly wants to go home, even if  I drop dead in my yard.  Patient's wife and daughter feel he may need LTC facility even after potential short-term SNF and especially if unable to get approval for SNF. Provided emotional support, therapeutic listening, and reassurance to patient. His main concern is that he does not want to die in one of these facilities. We discussed the option of hospice at home if he continues to decline. I shared that unfortunately these resources would not provide 24/7 care either, though potentially safer if he is no longer getting out of bed at that very last point of his life. Ultimately, he understands rationale for facility placement and wife's consistent visitation.  Patient would not desire aggressive life-prolonging care either at the end of his life nor in his current stage in life. No CPR, ventilator, feeding tubes. He would be agreeable to rehospitalization for management of his illness but not the ICU.    The difference between aggressive medical intervention and comfort care was considered in light of the patient's goals of care. Hospice and Palliative Care services outpatient were explained and offered.   Discussed the importance of continued conversation with family and the medical providers regarding overall plan of care and treatment options, ensuring decisions are within the context of the patient's values and GOCs.   Questions and concerns were addressed.  Hard Choices booklet left for review. The family was encouraged to call with questions or concerns.  PMT will continue to support holistically.   SUMMARY OF RECOMMENDATIONS   -Code status changed to DNR/DNI. Gold form was signed and placed in patient's hard chart. Will scan copy into EMR -Continue supportive care, no escalation of care to ICU -MOST form was completed today. Provided patient and family with a copy. Will scan copy into EMR. Placed original in patient's hard chart -Patient's goal is to return home, while wife and daughter are  in favor of SNF/LTC due to safety and lack of sufficient support in the home. Patient ultimately understands and he is agreeable with the caveat that he would prefer to die at home if it appeared he was closer to EOL -Spiritual care consulted for assistance with advanced directives -TOC consulted for assistance with outpatient palliative care referral -Psychosocial and emotional support provided -PMT remains available as needed  Prognosis:  Guarded  Discharge Planning: TBD      Primary Diagnoses: Present on Admission:  Acute kidney injury superimposed on chronic kidney disease (HCC)  Head contusion  SIRS (systemic inflammatory response syndrome) (HCC)  Hypothyroidism, postsurgical  Cognitive impairment  Lung nodule  Compression fracture of body of thoracic vertebra [facility])    Physical Exam Vitals and nursing note reviewed.  Constitutional:      General: He is not in acute distress. HENT:     Head: Normocephalic and atraumatic.   Cardiovascular:     Rate and Rhythm: Normal rate.  Pulmonary:     Effort: Pulmonary effort is normal.   Neurological:     Mental Status: He is alert and oriented to person, place, and time.   Psychiatric:        Mood and Affect: Mood normal.        Behavior: Behavior normal.     Vital Signs: BP 115/65 (BP Location: Right Arm)  Pulse 85   Temp 97.9 F (36.6 C)   Resp 16   Ht 5' 9 (1.753 m)   Wt 84 kg   SpO2 94%   BMI 27.35 kg/m  Pain Scale: 0-10   Pain Score: Asleep   SpO2: SpO2: 94 % O2 Device:SpO2: 94 % O2 Flow Rate: .O2 Flow Rate (L/min): 4 L/min    Total time: I spent 80 minutes in the care of the patient today in the above activities and documenting the encounter.  MDM: high   Shatina Streets Alroy Jericho, PA-C  Palliative Medicine Team Team phone # [phone]  Thank you for allowing the Palliative Medicine Team to assist in the care of this patient. Please utilize secure chat with additional questions, if there is no  response within 30 minutes please call the above phone number.  Palliative Medicine Team providers are available by phone from 7am to 7pm daily and can be reached through the team cell phone.  Should this patient require assistance outside of these hours, please call the patient's attending physician.

## 2023-07-04 NOTE — Progress Notes (Signed)
 Physical Therapy Treatment Patient Details Name: PAPA PIERCEFIELD MRN: [id number] DOB: 25-Apr-1931 Today's Date: 07/04/2023   History of Present Illness Pt is an 89 yo male who presented to [facility] ED after a fall from bed at home with unresponsiveness and noted head contusion following the fall. Pt also reporting urinary retention and not eating for a couple of days prior. Pt found to have acute urinary retention and AKI secondary to BPH. Pt also with elevated troponin and with noted growth in known lung nodule imaged on CT 6/11. PMH: CVA in 02/2023, postsurgical hypothyroidism, hypercalcemia, peripheral neuropathy, cognitive impairment, chronic back pain with chronic T12 and L1 compression fxs, HLD, SIRS    PT Comments  Pt seated in recliner.  Pt required decreased assistance but remains to require max VCs to maintain safety d/t impulsivity.  Continues to present with poor safety during transfers and without cues he is at risk for repeated falls secondary to his declining cognition.  Will continue to follow to improve strength and function during acute hospital stay.     If plan is discharge home, recommend the following: A little help with walking and/or transfers;Direct supervision/assist for medications management;Supervision due to cognitive status;Assist for transportation;Assistance with cooking/housework;A little help with bathing/dressing/bathroom;Help with stairs or ramp for entrance   Can travel by private vehicle     Yes  Equipment Recommendations  None recommended by PT    Recommendations for Other Services       Precautions / Restrictions Precautions Precautions: Fall Precaution/Restrictions Comments: reports multiple falls at home Restrictions Weight Bearing Restrictions Per Provider Order: No     Mobility  Bed Mobility Overal bed mobility: Needs Assistance Bed Mobility: Sit to Supine     Supine to sit: Supervision     General bed mobility comments: Able to return back to  bed unassisted.    Transfers Overall transfer level: Needs assistance Equipment used: Rolling walker (2 wheels) Transfers: Sit to/from Stand Sit to Stand: Contact guard assist           General transfer comment: Pt remains impulsive during session.  He continues to require cues for correct positioning when standing to improve ease of transfers.  Pt continues to attempt to stand from back of recliner seat and pull on RW.  Cues to push from arm rests of recliner chair.    Ambulation/Gait Ambulation/Gait assistance: Contact guard assist Gait Distance (Feet): 140 Feet Assistive device: Rolling walker (2 wheels) Gait Pattern/deviations: Step-through pattern, Decreased stride length, Trunk flexed       General Gait Details: Cues for RW safety. posture, increased step height and increased stride length.   Stairs             Wheelchair Mobility     Tilt Bed    Modified Rankin (Stroke Patients Only)       Balance                                            Communication Communication Communication: Impaired Factors Affecting Communication: Hearing impaired  Cognition Arousal: Alert Behavior During Therapy: WFL for tasks assessed/performed   PT - Cognitive impairments: Memory, Safety/Judgement                         Following commands: Intact      Cueing Cueing Techniques: Verbal cues, Visual cues  Exercises Other Exercises Other Exercises: L HS stretch 2 x 30 sec.  Performed contract relax technique as he reports tightness in L posterior hip.    General Comments        Pertinent Vitals/Pain Pain Assessment Pain Assessment: No/denies pain    Home Living                          Prior Function            PT Goals (current goals can now be found in the care plan section) Acute Rehab PT Goals Potential to Achieve Goals: Fair Progress towards PT goals: Progressing toward goals    Frequency    Min  2X/week      PT Plan      Co-evaluation              AM-PAC PT 6 Clicks Mobility   Outcome Measure  Help needed turning from your back to your side while in a flat bed without using bedrails?: A Little Help needed moving from lying on your back to sitting on the side of a flat bed without using bedrails?: A Little Help needed moving to and from a bed to a chair (including a wheelchair)?: A Little Help needed standing up from a chair using your arms (e.g., wheelchair or bedside chair)?: A Little Help needed to walk in hospital room?: A Little Help needed climbing 3-5 steps with a railing? : A Little 6 Click Score: 18    End of Session Equipment Utilized During Treatment: Gait belt Activity Tolerance: Patient tolerated treatment well Patient left: with call bell/phone within reach;with family/visitor present;in bed;with bed alarm set Nurse Communication: Mobility status PT Visit Diagnosis: Other abnormalities of gait and mobility (R26.89);History of falling (Z91.81);Repeated falls (R29.6);Other symptoms and signs involving the nervous system (R29.898)     Time: 4098-1191 PT Time Calculation (min) (ACUTE ONLY): 17 min  Charges:    $Gait Training: 8-22 mins PT General Charges $$ ACUTE PT VISIT: 1 Visit                     Beulah Brunt , PTA Acute Rehabilitation Services Office [phone]    Shamarion Coots Winford Haus 07/04/2023, 11:10 AM

## 2023-07-05 DIAGNOSIS — N179 Acute kidney failure, unspecified: Secondary | ICD-10-CM | POA: Diagnosis not present

## 2023-07-05 DIAGNOSIS — N189 Chronic kidney disease, unspecified: Secondary | ICD-10-CM | POA: Diagnosis not present

## 2023-07-05 DIAGNOSIS — R531 Weakness: Secondary | ICD-10-CM | POA: Diagnosis not present

## 2023-07-05 DIAGNOSIS — W19XXXA Unspecified fall, initial encounter: Secondary | ICD-10-CM | POA: Diagnosis not present

## 2023-07-05 NOTE — Progress Notes (Signed)
 Occupational Therapy Treatment Patient Details Name: Steve Thompson MRN: [id number] DOB: [birth year] Today's Date: 07/05/2023   History of present illness Pt is an 89 yo male who presented to [facility] ED after a fall from bed at home with unresponsiveness and noted head contusion following the fall. Pt also reporting urinary retention and not eating for a couple of days prior. Pt found to have acute urinary retention and AKI secondary to BPH. Pt also with elevated troponin and with noted growth in known lung nodule imaged on CT 6/11. PMH: CVA in 02/2023, postsurgical hypothyroidism, hypercalcemia, peripheral neuropathy, cognitive impairment, chronic back pain with chronic T12 and L1 compression fxs, HLD, SIRS   OT comments  Pt progressing well towards goals. Session today focused on improving balance during while completing executive functioning task such as recall and problem solving. During task pt exhibited decreased stride length and speed, difficulty with STM and attention. Pt wife and family present for session, asking questions regarding POC. OT answered questions to best of her ability, and encouraged spouse to stay in communication with TOC. Continue to recommend <3 hours of skilled rehab daily to optimize independence levels. Will continue to follow acutely.       If plan is discharge home, recommend the following:  A little help with walking and/or transfers;A little help with bathing/dressing/bathroom;Assistance with cooking/housework;Assist for transportation;Help with stairs or ramp for entrance   Equipment Recommendations  None recommended by OT    Recommendations for Other Services      Precautions / Restrictions Precautions Precautions: Fall Recall of Precautions/Restrictions: Impaired Precaution/Restrictions Comments: reports multiple falls at home Restrictions Weight Bearing Restrictions Per Provider Order: No       Mobility Bed Mobility Overal bed mobility: Needs  Assistance Bed Mobility: Supine to Sit, Sit to Supine     Supine to sit: Supervision Sit to supine: Supervision   General bed mobility comments: No assist, s for safety with catheter    Transfers Overall transfer level: Needs assistance Equipment used: Rolling walker (2 wheels) Transfers: Sit to/from Stand, Bed to chair/wheelchair/BSC Sit to Stand: Supervision     Step pivot transfers: Contact guard assist     General transfer comment: Initally min assist to steady in standing, once steady, CGA for mobility in hallway     Balance Overall balance assessment: Needs assistance Sitting-balance support: Feet supported Sitting balance-Leahy Scale: Good     Standing balance support: Bilateral upper extremity supported Standing balance-Leahy Scale: Poor Standing balance comment: UE support for balance       ADL either performed or assessed with clinical judgement   ADL Overall ADL's : Needs assistance/impaired     Toilet Transfer: Contact guard assist;Rolling walker (2 wheels);Ambulation Toilet Transfer Details (indicate cue type and reason): Simulated in room        Extremity/Trunk Assessment Upper Extremity Assessment Upper Extremity Assessment: Right hand dominant;Overall [facility] for tasks assessed   Lower Extremity Assessment Lower Extremity Assessment: Defer to PT evaluation                 Communication Communication Communication: Impaired Factors Affecting Communication: Hearing impaired   Cognition Arousal: Alert Behavior During Therapy: WFL for tasks assessed/performed Cognition: Cognition impaired, History of cognitive impairments     Awareness: Intellectual awareness intact, Online awareness intact Memory impairment (select all impairments): Short-term memory, Declarative long-term memory, Working memory Attention impairment (select first level of impairment): Alternating attention Executive functioning impairment (select all impairments):  Organization, Problem solving OT - Cognition Comments: Cognition  largely [facility] for tasks assessed but with deficits noted as above. Pt with difficulty completing cogntive recall during functional mobility, leading to decreased stride and balance deficits. Poor safety awareness       Following commands: Intact        Cueing   Cueing Techniques: Verbal cues, Visual cues        General Comments Wife present and requesting to speak to MD and NCM, OT notified them    Pertinent Vitals/ Pain       Pain Assessment Pain Assessment: No/denies pain   Frequency  Min 2X/week        Progress Toward Goals  OT Goals(current goals can now be found in the care plan section)  Progress towards OT goals: Progressing toward goals  Acute Rehab OT Goals Patient Stated Goal: To go home OT Goal Formulation: With patient/family Time For Goal Achievement: 07/12/23 Potential to Achieve Goals: Good ADL Goals Pt Will Perform Grooming: with supervision;standing Pt Will Perform Lower Body Bathing: with supervision;sitting/lateral leans;sit to/from stand Pt Will Perform Lower Body Dressing: with supervision;sitting/lateral leans;sit to/from stand Pt Will Transfer to Toilet: with supervision;ambulating;regular height toilet Pt Will Perform Toileting - Clothing Manipulation and hygiene: with supervision;sitting/lateral leans;sit to/from stand Additional ADL Goal #1: Patient will demonstrate understanding through teach back of education on fall prevention strategies with handout provided.  Plan         AM-PAC OT 6 Clicks Daily Activity     Outcome Measure   Help from another person eating meals?: None Help from another person taking care of personal grooming?: A Little Help from another person toileting, which includes using toliet, bedpan, or urinal?: A Little Help from another person bathing (including washing, rinsing, drying)?: A Little Help from another person to put on and taking off regular  upper body clothing?: A Little Help from another person to put on and taking off regular lower body clothing?: A Little 6 Click Score: 19    End of Session Equipment Utilized During Treatment: Rolling walker (2 wheels);Gait belt  OT Visit Diagnosis: Unsteadiness on feet (R26.81);Other abnormalities of gait and mobility (R26.89);Repeated falls (R29.6);History of falling (Z91.81);Other (comment)   Activity Tolerance Patient tolerated treatment well   Patient Left in bed;with bed alarm set;with call bell/phone within reach;with family/visitor present   Nurse Communication Mobility status        Time: 6213-0865 OT Time Calculation (min): 29 min  Charges: OT General Charges $OT Visit: 1 Visit OT Treatments $Self Care/Home Management : 23-37 mins  Delmer Ferraris, OT  Acute Rehabilitation Services Office [phone] Secure chat preferred   Mickael Alamo 07/05/2023, 4:42 PM

## 2023-07-05 NOTE — Progress Notes (Signed)
 [facility] PROGRESS NOTE    Progress Note  Steve Thompson  ZOX:[id number] DOB: December 18, 1931 DOA: 06/27/2023 PCP: [name], MD     Brief Narrative:   Steve Thompson is an 89 y.o. male past medical history of cognitive impairment, myasthenia gravis previous status hypoparathyroidism, back pain BPH hypercalcemia presents to the hospital with generalized weakness poor oral intake and a fall at home, was found down by his wife on the day of admission.  He then talk, she relates he has been having urinary retention with dribbling and inability urinate for couple days.  He was seen by his just was consulted.  Foley and follow-up with them as an outpatient.   Assessment/Plan:   Acute kidney injury superimposed on chronic kidney disease likely secondary to BPH leading to urinary retention: Urology was consulted and Foley was placed will follow urology as an outpatient. Creatinine has returned to baseline. Continue Flomax . Awaiting insurance authorization for placement.  Elevated troponin/BNP: 2D echo showed no acute findings likely delayed clearing from acute kidney injury. Continue Plavix  and Crestor .  SIRS: Likely reactive sepsis being ruled out.  Generalized weakness: PT OT consulted  Incidental lung nodule: CT of the chest showed 1.7 cm left lower lobe nodule. Pulmonary consulted will follow-up as an outpatient.  Hypovolemic hyponatremia: Resolved.  History of CVA: Continue Plavix  and statins.  Iatrogenic hypothyroidism: Continue Synthroid .  Chronic compression fracture T12 and L1: Noted.  Cognitive impairment: Noted.  History of ocular myasthenia gravis: Continue Mestinon  3 times a day follow-up with neurology as an outpatient.  Hyperlipidemia: Continue Crestor .  Hypokalemia/hypomagnesemia: Repleted now resolved.  Disposition PT OT evaluated the patient awaiting skilled nursing facility placement but insurance denied. Peer-to-peer done awaiting  callback.  Goals of care: Palliative care was consulted and having a meeting today at 1030.  DVT prophylaxis: lovenox  Family Communication:none Status is: Inpatient Remains inpatient appropriate because: Acute kidney injury    Code Status:     Code Status Orders  (From admission, onward)           Start     Ordered   06/27/23 1213  Full code  Continuous       Question:  By:  Answer:  Consent: discussion documented in EHR   06/27/23 1213           Code Status History     Date Active Date Inactive Code Status Order ID Comments User Context   02/25/2023 1606 02/28/2023 2201 Full Code [id number]  Oral Billings, MD ED         IV Access:   Peripheral IV   Procedures and diagnostic studies:   No results found.   Medical Consultants:   None.   Subjective:    Steve Thompson no complaints this morning.  Objective:    Vitals:   07/04/23 0753 07/04/23 1551 07/04/23 1952 07/05/23 0808  BP: 115/65 (!) 115/57 106/62 111/61  Pulse: 85 86 87 76  Resp: 16 16 17 16   Temp: 97.9 F (36.6 C) 98.8 F (37.1 C) 97.7 F (36.5 C) 97.7 F (36.5 C)  TempSrc:      SpO2: 94% 90% 91% 91%  Weight:      Height:       SpO2: 91 % O2 Flow Rate (L/min): 4 L/min   Intake/Output Summary (Last 24 hours) at 07/05/2023 0821 Last data filed at 07/05/2023 0426 Gross per 24 hour  Intake --  Output 875 ml  Net -875 ml   Steve Thompson  Weights   06/27/23 0845  Weight: 84 kg    Exam: General exam: In no acute distress. Respiratory system: Good air movement and clear to auscultation. Cardiovascular system: S1 & S2 heard, RRR. No JVD. Gastrointestinal system: Abdomen is nondistended, soft and nontender.  Extremities: No pedal edema. Skin: No rashes, lesions or ulcers Psychiatry: Judgement and insight appear normal. Mood & affect appropriate.  Data Reviewed:    Labs: Basic Metabolic Panel: Recent Labs  Lab 06/29/23 0639 06/30/23 1009  NA 139 136  K 2.9* 3.8  CL 101  102  CO2 26 25  GLUCOSE 102* 128*  BUN 7* 8  CREATININE 0.78 0.72  CALCIUM  9.0 9.0  MG 1.5* 1.7  PHOS 2.7 2.8   GFR Estimated Creatinine Clearance: 62.6 mL/min (by C-G formula based on SCr of 0.72 mg/dL). Liver Function Tests: Recent Labs  Lab 06/29/23 0639 06/30/23 1009  ALBUMIN 3.3* 3.2*   No results for input(s): LIPASE, AMYLASE in the last 168 hours. No results for input(s): AMMONIA in the last 168 hours. Coagulation profile No results for input(s): INR, PROTIME in the last 168 hours. COVID-19 Labs  No results for input(s): DDIMER, FERRITIN, LDH, CRP in the last 72 hours.  No results found for: SARSCOV2NAA  CBC: Recent Labs  Lab 06/29/23 0639  WBC 9.3  HGB 13.3  HCT 39.0  MCV 87.8  PLT 258   Cardiac Enzymes: No results for input(s): CKTOTAL, CKMB, CKMBINDEX, TROPONINI in the last 168 hours. BNP (last 3 results) No results for input(s): PROBNP in the last 8760 hours. CBG: No results for input(s): GLUCAP in the last 168 hours. D-Dimer: No results for input(s): DDIMER in the last 72 hours. Hgb A1c: No results for input(s): HGBA1C in the last 72 hours. Lipid Profile: No results for input(s): CHOL, HDL, LDLCALC, TRIG, CHOLHDL, LDLDIRECT in the last 72 hours. Thyroid  function studies: No results for input(s): TSH, T4TOTAL, T3FREE, THYROIDAB in the last 72 hours.  Invalid input(s): FREET3 Anemia work up: No results for input(s): VITAMINB12, FOLATE, FERRITIN, TIBC, IRON, RETICCTPCT in the last 72 hours. Sepsis Labs: Recent Labs  Lab 06/29/23 0639  WBC 9.3   Microbiology Recent Results (from the past 240 hours)  Culture, blood (routine x 2)     Status: None   Collection Time: 06/27/23  8:55 AM   Specimen: BLOOD RIGHT ARM  Result Value Ref Range Status   Specimen Description BLOOD RIGHT ARM  Final   Special Requests   Final    BOTTLES DRAWN AEROBIC AND ANAEROBIC Blood Culture results  may not be optimal due to an inadequate volume of blood received in culture bottles   Culture   Final    NO GROWTH 5 DAYS Performed at [facility], [address] [place], [place] [address]    Report Status 07/02/2023 FINAL  Final  Culture, blood (routine x 2)     Status: None   Collection Time: 06/27/23 10:08 AM   Specimen: BLOOD RIGHT HAND  Result Value Ref Range Status   Specimen Description BLOOD RIGHT HAND  Final   Special Requests   Final    AEROBIC BOTTLE ONLY Blood Culture results may not be optimal due to an inadequate volume of blood received in culture bottles   Culture   Final    NO GROWTH 5 DAYS Performed at [facility], [address], [place], [place] [address]    Report Status 07/02/2023 FINAL  Final  Urine Culture  Status: None   Collection Time: 06/27/23  1:01 PM   Specimen: Urine, Catheterized  Result Value Ref Range Status   Specimen Description URINE, CATHETERIZED  Final   Special Requests NONE  Final   Culture   Final    NO GROWTH Performed at [facility], [address], [place], [place] [address]    Report Status 06/28/2023 FINAL  Final     Medications:    Chlorhexidine  Gluconate Cloth  6 each Topical Daily   clopidogrel   75 mg Oral Daily   gabapentin   300 mg Oral BID   heparin   5,000 Units Subcutaneous Q8H   levothyroxine   100 mcg Oral Daily   loratadine   10 mg Oral Daily   rosuvastatin   20 mg Oral Daily   sodium chloride  flush  3 mL Intravenous Q12H   tamsulosin   0.8 mg Oral Daily   Continuous Infusions:    LOS: 8 days   Steve Thompson  [facility]  07/05/2023, 8:21 AM

## 2023-07-05 NOTE — Plan of Care (Signed)
   Problem: Health Behavior/Discharge Planning: Goal: Ability to manage health-related needs will improve Outcome: Progressing

## 2023-07-05 NOTE — Progress Notes (Signed)
 Mobility Specialist Progress Note:    07/05/23 1036  Mobility  Activity Ambulated with assistance in hallway  Level of Assistance Contact guard assist, steadying assist  Assistive Device Front wheel walker  Distance Ambulated (ft) 120 ft  Activity Response Tolerated well  Mobility Referral Yes  Mobility visit 1 Mobility  Mobility Specialist Start Time (ACUTE ONLY) 0941  Mobility Specialist Stop Time (ACUTE ONLY) 0953  Mobility Specialist Time Calculation (min) (ACUTE ONLY) 12 min   Pt received in chair, agreeable to mobility. When pt attempted to stand, he demonstrated a strong posterior lean. Pt was able to correct independently. C/o BLE pain, otherwise no c/o. Returned to room w/o fault. Pt left in chair w/ chair alarm on. Personal belongings and call light within reach. All needs met.   Doak Free Mobility Specialist  Please contact via [employer] or  Rehab Office [phone]

## 2023-07-06 ENCOUNTER — Ambulatory Visit: Admitting: Physical Therapy

## 2023-07-06 DIAGNOSIS — N189 Chronic kidney disease, unspecified: Secondary | ICD-10-CM | POA: Diagnosis not present

## 2023-07-06 DIAGNOSIS — N179 Acute kidney failure, unspecified: Secondary | ICD-10-CM | POA: Diagnosis not present

## 2023-07-06 NOTE — Progress Notes (Signed)
 [facility] PROGRESS NOTE    Progress Note  Steve Thompson  ZOX:[id number] DOB: 1931-12-08 DOA: 06/27/2023 PCP: [name], MD     Brief Narrative:   Steve Thompson is an 89 y.o. male past medical history of cognitive impairment, myasthenia gravis previous status hypoparathyroidism, back pain BPH hypercalcemia presents to the hospital with generalized weakness poor oral intake and a fall at home, was found down by his wife on the day of admission.  He then talk, she relates he has been having urinary retention with dribbling and inability urinate for couple days.  He was seen by his just was consulted.  Foley and follow-up with them as an outpatient.  Assessment/Plan:   Acute kidney injury superimposed on chronic kidney disease likely secondary to BPH leading to urinary retention: Urology was consulted and Foley was placed will follow urology as an outpatient. Creatinine has returned to baseline. Continue Flomax . Awaiting insurance authorization for placement.  Elevated troponin/BNP: 2D echo showed no acute findings likely delayed clearing from acute kidney injury. Continue Plavix  and Crestor .  SIRS: Likely reactive sepsis being ruled out.  Generalized weakness: PT OT consulted  Incidental lung nodule: CT of the chest showed 1.7 cm left lower lobe nodule. Pulmonary consulted will follow-up as an outpatient.  Hypovolemic hyponatremia: Resolved.  History of CVA: Continue Plavix  and statins.  Iatrogenic hypothyroidism: Continue Synthroid .  Chronic compression fracture T12 and L1: Noted.  Cognitive impairment: Noted.  History of ocular myasthenia gravis: Continue Mestinon  3 times a day follow-up with neurology as an outpatient.  Hyperlipidemia: Continue Crestor .  Hypokalemia/hypomagnesemia: Repleted now resolved.  Disposition PT OT evaluated the patient awaiting skilled nursing facility placement but insurance denied. Peer-to-peer done awaiting  callback.  Goals of care: Palliative care was consulted and having a meeting today at 1030.  DVT prophylaxis: lovenox  Family Communication:none Status is: Inpatient Remains inpatient appropriate because: Acute kidney injury    Code Status:     Code Status Orders  (From admission, onward)           Start     Ordered   06/27/23 1213  Full code  Continuous       Question:  By:  Answer:  Consent: discussion documented in EHR   06/27/23 1213           Code Status History     Date Active Date Inactive Code Status Order ID Comments User Context   02/25/2023 1606 02/28/2023 2201 Full Code [id number]  Oral Billings, MD ED         IV Access:   Peripheral IV   Procedures and diagnostic studies:   No results found.   Medical Consultants:   None.   Subjective:    Steve Thompson no complaints this morning.  Objective:    Vitals:   07/05/23 1352 07/05/23 2050 07/06/23 0515 07/06/23 0748  BP: (!) 114/54 118/67 (!) 113/57 (!) 117/59  Pulse: 82 78 77 80  Resp: 16 18 17 17   Temp: 98 F (36.7 C) 98.2 F (36.8 C) 98.5 F (36.9 C) 97.6 F (36.4 C)  TempSrc:      SpO2: 95% 92% 91% 91%  Weight:      Height:       SpO2: 91 % O2 Flow Rate (L/min): 4 L/min   Intake/Output Summary (Last 24 hours) at 07/06/2023 0825 Last data filed at 07/05/2023 2000 Gross per 24 hour  Intake 480 ml  Output 600 ml  Net -120 ml  Filed Weights   06/27/23 0845  Weight: 84 kg    Exam: General exam: In no acute distress. Respiratory system: Good air movement and clear to auscultation. Cardiovascular system: S1 & S2 heard, RRR. No JVD. Gastrointestinal system: Abdomen is nondistended, soft and nontender.  Extremities: No pedal edema. Skin: No rashes, lesions or ulcers Psychiatry: Judgement and insight appear normal. Mood & affect appropriate.  Data Reviewed:    Labs: Basic Metabolic Panel: Recent Labs  Lab 06/30/23 1009  NA 136  K 3.8  CL 102  CO2 25   GLUCOSE 128*  BUN 8  CREATININE 0.72  CALCIUM  9.0  MG 1.7  PHOS 2.8   GFR Estimated Creatinine Clearance: 62.6 mL/min (by C-G formula based on SCr of 0.72 mg/dL). Liver Function Tests: Recent Labs  Lab 06/30/23 1009  ALBUMIN 3.2*   No results for input(s): LIPASE, AMYLASE in the last 168 hours. No results for input(s): AMMONIA in the last 168 hours. Coagulation profile No results for input(s): INR, PROTIME in the last 168 hours. COVID-19 Labs  No results for input(s): DDIMER, FERRITIN, LDH, CRP in the last 72 hours.  No results found for: SARSCOV2NAA  CBC: No results for input(s): WBC, NEUTROABS, HGB, HCT, MCV, PLT in the last 168 hours.  Cardiac Enzymes: No results for input(s): CKTOTAL, CKMB, CKMBINDEX, TROPONINI in the last 168 hours. BNP (last 3 results) No results for input(s): PROBNP in the last 8760 hours. CBG: No results for input(s): GLUCAP in the last 168 hours. D-Dimer: No results for input(s): DDIMER in the last 72 hours. Hgb A1c: No results for input(s): HGBA1C in the last 72 hours. Lipid Profile: No results for input(s): CHOL, HDL, LDLCALC, TRIG, CHOLHDL, LDLDIRECT in the last 72 hours. Thyroid  function studies: No results for input(s): TSH, T4TOTAL, T3FREE, THYROIDAB in the last 72 hours.  Invalid input(s): FREET3 Anemia work up: No results for input(s): VITAMINB12, FOLATE, FERRITIN, TIBC, IRON, RETICCTPCT in the last 72 hours. Sepsis Labs: No results for input(s): PROCALCITON, WBC, LATICACIDVEN in the last 168 hours.  Microbiology Recent Results (from the past 240 hours)  Culture, blood (routine x 2)     Status: None   Collection Time: 06/27/23  8:55 AM   Specimen: BLOOD RIGHT ARM  Result Value Ref Range Status   Specimen Description BLOOD RIGHT ARM  Final   Special Requests   Final    BOTTLES DRAWN AEROBIC AND ANAEROBIC Blood Culture results may not be  optimal due to an inadequate volume of blood received in culture bottles   Culture   Final    NO GROWTH 5 DAYS Performed at [facility], [address], [place], [place] [address]    Report Status 07/02/2023 FINAL  Final  Culture, blood (routine x 2)     Status: None   Collection Time: 06/27/23 10:08 AM   Specimen: BLOOD RIGHT HAND  Result Value Ref Range Status   Specimen Description BLOOD RIGHT HAND  Final   Special Requests   Final    AEROBIC BOTTLE ONLY Blood Culture results may not be optimal due to an inadequate volume of blood received in culture bottles   Culture   Final    NO GROWTH 5 DAYS Performed at [facility], [address], [place], [place] [address]    Report Status 07/02/2023 FINAL  Final  Urine Culture     Status: None   Collection Time: 06/27/23  1:01 PM   Specimen: Urine, Catheterized  Result  Value Ref Range Status   Specimen Description URINE, CATHETERIZED  Final   Special Requests NONE  Final   Culture   Final    NO GROWTH Performed at [facility], [address], [place], [place] [address]    Report Status 06/28/2023 FINAL  Final     Medications:    Chlorhexidine  Gluconate Cloth  6 each Topical Daily   clopidogrel   75 mg Oral Daily   gabapentin   300 mg Oral BID   heparin   5,000 Units Subcutaneous Q8H   levothyroxine   100 mcg Oral Daily   loratadine   10 mg Oral Daily   rosuvastatin   20 mg Oral Daily   sodium chloride  flush  3 mL Intravenous Q12H   tamsulosin   0.8 mg Oral Daily   Continuous Infusions:    LOS: 9 days   Macdonald Savoy  [facility]  07/06/2023, 8:25 AM

## 2023-07-06 NOTE — TOC Progression Note (Addendum)
 Transition of Care [facility]) - Progression Note    Patient Details  Name: Steve Thompson MRN: [id number] Date of Birth: [birth year]  [facility] CM/SW Contact  Elspeth Hals, LCSW Phone Number: 07/06/2023, 8:35 AM  Clinical Narrative:   Per [place]/financial counseling, medicaid screening complete, pt will not qualify.    CSW spoke with pt and wife in room, updated them on medicaid outcome.  Wife confused as someone told pt that he was denied for SNF, discussed that appeal is still pending.  At wife request, CSW spoke on the phone with someone from their church, provided update, discussed [facility] and he is going to try to assist with getting pt signed up for [place] services.      Expected Discharge Plan: Skilled Nursing Facility Barriers to Discharge: Continued Medical Work up, SNF Pending bed offer  Expected Discharge Plan and Services In-house Referral: Clinical Social Work   Post Acute Care Choice: Skilled Nursing Facility Living arrangements for the past 2 months: Single Family Home Expected Discharge Date: 07/03/23                                     Social Determinants of Health (SDOH) Interventions SDOH Screenings   Food Insecurity: Patient Declined (06/28/2023)  Housing: Low Risk  (06/28/2023)  Transportation Needs: No Transportation Needs (06/28/2023)  Utilities: Patient Declined (06/28/2023)  Social Connections: Unknown (06/28/2023)  Tobacco Use: Medium Risk (06/27/2023)    Readmission Risk Interventions     No data to display

## 2023-07-06 NOTE — Progress Notes (Signed)
 Physical Therapy Treatment Patient Details Name: Steve Thompson MRN: [id number] DOB: [birth year] Today's Date: 07/06/2023   History of Present Illness Pt is an 89 yo male who presented to [facility] ED after a fall from bed at home with unresponsiveness and noted head contusion following the fall. Pt also reporting urinary retention and not eating for a couple of days prior. Pt found to have acute urinary retention and AKI secondary to BPH. Pt also with elevated troponin and with noted growth in known lung nodule imaged on CT 6/11. PMH: CVA in 02/2023, postsurgical hypothyroidism, hypercalcemia, peripheral neuropathy, cognitive impairment, chronic back pain with chronic T12 and L1 compression fxs, HLD, SIRS    PT Comments  Pt continues PT in acute setting.  Performed B HS stretch to address tightness.  He continues to c/o L hip pain.  Cognition remains to impair mobility.  Strength is improving.  Will continue to follow.     If plan is discharge home, recommend the following: A little help with walking and/or transfers;Direct supervision/assist for medications management;Supervision due to cognitive status;Assist for transportation;Assistance with cooking/housework;A little help with bathing/dressing/bathroom;Help with stairs or ramp for entrance   Can travel by private vehicle     Yes  Equipment Recommendations  None recommended by PT    Recommendations for Other Services       Precautions / Restrictions Precautions Precautions: Fall Recall of Precautions/Restrictions: Impaired Precaution/Restrictions Comments: reports multiple falls at home Restrictions Weight Bearing Restrictions Per Provider Order: No     Mobility  Bed Mobility               General bed mobility comments: In recliner on arrival.    Transfers Overall transfer level: Needs assistance Equipment used: Rolling walker (2 wheels), None Transfers: Sit to/from Stand, Bed to chair/wheelchair/BSC Sit to Stand:  Supervision   Step pivot transfers: Contact guard assist       General transfer comment: Pt required assistance for hand placement, pt continues to benefit from RW as he reports increased pain in L hip in standing.    Ambulation/Gait Ambulation/Gait assistance: Min assist, Contact guard assist Gait Distance (Feet): 150 Feet Assistive device: Rolling walker (2 wheels) Gait Pattern/deviations: Step-through pattern, Decreased stride length, Trunk flexed       General Gait Details: Cues for posture.  performed 10 ft without device and pain in hip is not tolerable, so returned to RW to off load L hip.   Stairs             Wheelchair Mobility     Tilt Bed    Modified Rankin (Stroke Patients Only)       Balance Overall balance assessment: Needs assistance Sitting-balance support: Feet supported Sitting balance-Leahy Scale: Good     Standing balance support: Bilateral upper extremity supported Standing balance-Leahy Scale: Poor Standing balance comment: UE support for balance                            Communication Communication Communication: Impaired Factors Affecting Communication: Hearing impaired  Cognition Arousal: Alert Behavior During Therapy: WFL for tasks assessed/performed   PT - Cognitive impairments: Memory, Safety/Judgement                       PT - Cognition Comments: appropriate for PT session Following commands: Intact      Cueing Cueing Techniques: Verbal cues, Visual cues  Exercises Other Exercises Other Exercises: B HS  stretch # x 30 sec.    General Comments        Pertinent Vitals/Pain Pain Assessment Pain Assessment: No/denies pain Pain Location: L hip reports increased pain Pain Descriptors / Indicators: Sore, Discomfort Pain Intervention(s): Monitored during session, Repositioned    Home Living                          Prior Function            PT Goals (current goals can now be  found in the care plan section) Acute Rehab PT Goals Patient Stated Goal: return to independent, stop falling Potential to Achieve Goals: Fair Progress towards PT goals: Progressing toward goals    Frequency    Min 2X/week      PT Plan      Co-evaluation              AM-PAC PT 6 Clicks Mobility   Outcome Measure  Help needed turning from your back to your side while in a flat bed without using bedrails?: A Little Help needed moving from lying on your back to sitting on the side of a flat bed without using bedrails?: A Little Help needed moving to and from a bed to a chair (including a wheelchair)?: A Little Help needed standing up from a chair using your arms (e.g., wheelchair or bedside chair)?: A Little Help needed to walk in hospital room?: A Little Help needed climbing 3-5 steps with a railing? : A Little 6 Click Score: 18    End of Session Equipment Utilized During Treatment: Gait belt Activity Tolerance: Patient tolerated treatment well Patient left: with call bell/phone within reach;with family/visitor present;in bed;with bed alarm set Nurse Communication: Mobility status PT Visit Diagnosis: Other abnormalities of gait and mobility (R26.89);History of falling (Z91.81);Repeated falls (R29.6);Other symptoms and signs involving the nervous system (R29.898)     Time: 9147-8295 PT Time Calculation (min) (ACUTE ONLY): 17 min  Charges:    $Gait Training: 8-22 mins PT General Charges $$ ACUTE PT VISIT: 1 Visit                     Beulah Brunt , PTA Acute Rehabilitation Services Office [phone]    Reynolds Cea 07/06/2023, 2:43 PM

## 2023-07-06 NOTE — Progress Notes (Signed)
   07/06/23 1600  Spiritual Encounters  Type of Visit Follow up  Care provided to: Pt and family  Reason for visit Advance directives  OnCall Visit Yes    Chaplain was paged for Advance Care Directives. Chaplain helped them to fill out the document. Patient and his wife stated that their questions were answered. The document couldn't be notarized because the notary and two witnesses were no longer in the building due to the late hour.    M.Kubra Welby Hale Resident [phone]

## 2023-07-06 NOTE — Progress Notes (Signed)
   07/06/23 1300  Spiritual Encounters  Type of Visit Initial  Care provided to: Pt and family  Reason for visit Advance directives  OnCall Visit No  Interventions  Spiritual Care Interventions Made Established relationship of care and support;Compassionate presence;Decision-making support/facilitation  Intervention Outcomes  Outcomes Connection to spiritual care;Awareness around self/spiritual resourses;Autonomy/agency;Patient family open to resources  Advance Directives (For Healthcare)  Does Patient Have a Medical Advance Directive? No  Would patient like information on creating a medical advance directive? Yes (Inpatient - patient defers creating a medical advance directive at this time - Information given)    Chaplain responded to spiritual request for AD. Paperwork and information given. Chaplains remain available.

## 2023-07-06 NOTE — TOC Progression Note (Addendum)
 Transition of Care [facility]) - Progression Note    Patient Details  Name: Steve Thompson MRN: [id number] Date of Birth: 1931/07/13  Transition of [facility]) CM/SW Contact  Elspeth Hals, LCSW Phone Number: 07/06/2023, 1:23 PM  Clinical Narrative:   CSW spoke with pt daughter Cal, updated her that pt not eligible for medicaid.  Appeal still pending.  She is going to work on  [facility] aide for pt, she is already on [email or web address].  Private pay at SNF is not going to be an option.  Discussed that would be worth seeing what help VA could provide.       Expected Discharge Plan: Skilled Nursing Facility Barriers to Discharge: Continued Medical Work up, SNF Pending bed offer  Expected Discharge Plan and Services In-house Referral: Clinical Social Work   Post Acute Care Choice: Skilled Nursing Facility Living arrangements for the past 2 months: Single Family Home Expected Discharge Date: 07/03/23                                     Social Determinants of Health (SDOH) Interventions SDOH Screenings   Food Insecurity: Patient Declined (06/28/2023)  Housing: Low Risk  (06/28/2023)  Transportation Needs: No Transportation Needs (06/28/2023)  Utilities: Patient Declined (06/28/2023)  Social Connections: Unknown (06/28/2023)  Tobacco Use: Medium Risk (06/27/2023)    Readmission Risk Interventions     No data to display

## 2023-07-06 NOTE — Progress Notes (Signed)
 Mobility Specialist Progress Note:    07/06/23 1000  Mobility  Activity Ambulated with assistance in hallway  Level of Assistance Contact guard assist, steadying assist  Assistive Device Front wheel walker  Distance Ambulated (ft) 120 ft  Activity Response Tolerated well  Mobility Referral Yes  Mobility visit 1 Mobility  Mobility Specialist Start Time (ACUTE ONLY) [id number]  Mobility Specialist Stop Time (ACUTE ONLY) 0856   Received pt in bed having no complaints and agreeable to mobility. Pt was asymptomatic throughout ambulation and returned to room w/o fault. Left in chair w/ call bell in reach and all needs met. Chair alarm on.   Steve Thompson Mobility Specialist Please contact via Special educational needs teacher or Rehab office at [phone]

## 2023-07-06 NOTE — Plan of Care (Signed)

## 2023-07-07 DIAGNOSIS — N179 Acute kidney failure, unspecified: Secondary | ICD-10-CM | POA: Diagnosis not present

## 2023-07-07 DIAGNOSIS — N189 Chronic kidney disease, unspecified: Secondary | ICD-10-CM | POA: Diagnosis not present

## 2023-07-07 NOTE — Progress Notes (Signed)
 Mobility Specialist Progress Note:    07/07/23 1100  Mobility  Activity Ambulated with assistance in hallway  Level of Assistance Contact guard assist, steadying assist  Assistive Device Front wheel walker  Distance Ambulated (ft) 120 ft  Activity Response Tolerated well  Mobility Referral Yes  Mobility visit 1 Mobility  Mobility Specialist Start Time (ACUTE ONLY) 1004  Mobility Specialist Stop Time (ACUTE ONLY) 1011  Mobility Specialist Time Calculation (min) (ACUTE ONLY) 7 min   Received pt in chair having no complaints and agreeable to mobility. Pt was asymptomatic throughout ambulation and returned to room w/o fault. Left in chair w/ call bell in reach and all needs met.   D'Vante Nicholaus Mobility Specialist Please contact via Special educational needs teacher or Rehab office at [phone]

## 2023-07-07 NOTE — TOC Progression Note (Signed)
 Transition of Care [facility]) - Progression Note    Patient Details  Name: Steve Thompson MRN: [id number] Date of Birth: 10-19-1931  Transition of Care [facility]) CM/SW Contact  Hartley KATHEE Robertson, LCSWA Phone Number: 07/07/2023, 12:20 PM  Clinical Narrative:     CSW spoke with pt's spouse, she states appeal is still pending and insurance company states a decision won't be made before Monday.   Expected Discharge Plan: Skilled Nursing Facility Barriers to Discharge: Continued Medical Work up, SNF Pending bed offer  Expected Discharge Plan and Services In-house Referral: Clinical Social Work   Post Acute Care Choice: Skilled Nursing Facility Living arrangements for the past 2 months: Single Family Home Expected Discharge Date: 07/03/23                                     Social Determinants of Health (SDOH) Interventions SDOH Screenings   Food Insecurity: Patient Declined (06/28/2023)  Housing: Low Risk  (06/28/2023)  Transportation Needs: No Transportation Needs (06/28/2023)  Utilities: Patient Declined (06/28/2023)  Social Connections: Unknown (06/28/2023)  Tobacco Use: Medium Risk (06/27/2023)    Readmission Risk Interventions     No data to display

## 2023-07-07 NOTE — Plan of Care (Signed)
  Problem: Activity: Goal: Risk for activity intolerance will decrease Outcome: Progressing   Problem: Safety: Goal: Ability to remain free from injury will improve Outcome: Progressing   

## 2023-07-07 NOTE — Progress Notes (Signed)
 [facility] PROGRESS NOTE    Progress Note  Steve Thompson  FMW:[id number] DOB: 1931-02-21 DOA: 06/27/2023 PCP: Okey Steve Redbird, MD     Brief Narrative:   Steve Thompson is an 89 y.o. male past medical history of cognitive impairment, myasthenia gravis previous status hypoparathyroidism, back pain BPH hypercalcemia presents to the hospital with generalized weakness poor oral intake and a fall at home, was found down by his wife on the day of admission.  He then talk, she relates he has been having urinary retention with dribbling and inability urinate for couple days.  He was seen by his just was consulted.  Foley and follow-up with them as an outpatient.  Assessment/Plan:   Acute kidney injury superimposed on chronic kidney disease likely secondary to BPH leading to urinary retention: Urology was consulted and Foley was placed will follow urology as an outpatient. Creatinine has returned to baseline. Continue Flomax . Awaiting insurance authorization for placement it was denied. Family is appeal insurance.  Elevated troponin/BNP: 2D echo showed no acute findings likely delayed clearing from acute kidney injury. Continue Plavix  and Crestor .  SIRS: Likely reactive sepsis being ruled out.  Generalized weakness: PT OT consulted.  Incidental lung nodule: CT of the chest showed 1.7 cm left lower lobe nodule. Pulmonary consulted will follow-up as an outpatient.  Hypovolemic hyponatremia: Resolved.  History of CVA: Continue Plavix  and statins.  Iatrogenic hypothyroidism: Continue Synthroid .  Chronic compression fracture T12 and L1: Noted.  Cognitive impairment: Noted.  History of ocular myasthenia gravis: Continue Mestinon  3 times a day follow-up with neurology as an outpatient.  Hyperlipidemia: Continue Crestor .  Hypokalemia/hypomagnesemia: Repleted now resolved.  Disposition PT OT evaluated the patient awaiting skilled nursing facility placement but  insurance denied. Peer-to-peer done awaiting callback.  Goals of care: Palliative care was consulted and having a meeting today at 1030.  DVT prophylaxis: lovenox  Family Communication:none Status is: Inpatient Remains inpatient appropriate because: Acute kidney injury    Code Status:     Code Status Orders  (From admission, onward)           Start     Ordered   06/27/23 1213  Full code  Continuous       Question:  By:  Answer:  Consent: discussion documented in EHR   06/27/23 1213           Code Status History     Date Active Date Inactive Code Status Order ID Comments User Context   02/25/2023 1606 02/28/2023 2201 Full Code [id number]  Cindy Garnette POUR, MD ED         IV Access:   Peripheral IV   Procedures and diagnostic studies:   No results found.   Medical Consultants:   None.   Subjective:    Steve Thompson no complaints this morning.  Objective:    Vitals:   07/06/23 0748 07/06/23 1430 07/06/23 2009 07/07/23 0456  BP: (!) 117/59 121/67 109/65 122/77  Pulse: 80 68 81 81  Resp: 17 16 18 18   Temp: 97.6 F (36.4 C) 97.8 F (36.6 C) 98.2 F (36.8 C) 98 F (36.7 C)  TempSrc:      SpO2: 91% 100% 95% 93%  Weight:      Height:       SpO2: 93 % O2 Flow Rate (L/min): 4 L/min   Intake/Output Summary (Last 24 hours) at 07/07/2023 0751 Last data filed at 07/07/2023 0600 Gross per 24 hour  Intake 720 ml  Output 2450 ml  Net -1730 ml   Filed Weights   06/27/23 0845  Weight: 84 kg    Exam: General exam: In no acute distress. Respiratory system: Good air movement and clear to auscultation. Cardiovascular system: S1 & S2 heard, RRR. No JVD. Gastrointestinal system: Abdomen is nondistended, soft and nontender.  Extremities: No pedal edema. Skin: No rashes, lesions or ulcers Psychiatry: Judgement and insight appear normal. Mood & affect appropriate.  Data Reviewed:    Labs: Basic Metabolic Panel: Recent Labs  Lab 06/30/23 1009   NA 136  K 3.8  CL 102  CO2 25  GLUCOSE 128*  BUN 8  CREATININE 0.72  CALCIUM  9.0  MG 1.7  PHOS 2.8   GFR Estimated Creatinine Clearance: 62.6 mL/min (by C-G formula based on SCr of 0.72 mg/dL). Liver Function Tests: Recent Labs  Lab 06/30/23 1009  ALBUMIN 3.2*   No results for input(s): LIPASE, AMYLASE in the last 168 hours. No results for input(s): AMMONIA in the last 168 hours. Coagulation profile No results for input(s): INR, PROTIME in the last 168 hours. COVID-19 Labs  No results for input(s): DDIMER, FERRITIN, LDH, CRP in the last 72 hours.  No results found for: SARSCOV2NAA  CBC: No results for input(s): WBC, NEUTROABS, HGB, HCT, MCV, PLT in the last 168 hours.  Cardiac Enzymes: No results for input(s): CKTOTAL, CKMB, CKMBINDEX, TROPONINI in the last 168 hours. BNP (last 3 results) No results for input(s): PROBNP in the last 8760 hours. CBG: No results for input(s): GLUCAP in the last 168 hours. D-Dimer: No results for input(s): DDIMER in the last 72 hours. Hgb A1c: No results for input(s): HGBA1C in the last 72 hours. Lipid Profile: No results for input(s): CHOL, HDL, LDLCALC, TRIG, CHOLHDL, LDLDIRECT in the last 72 hours. Thyroid  function studies: No results for input(s): TSH, T4TOTAL, T3FREE, THYROIDAB in the last 72 hours.  Invalid input(s): FREET3 Anemia work up: No results for input(s): VITAMINB12, FOLATE, FERRITIN, TIBC, IRON, RETICCTPCT in the last 72 hours. Sepsis Labs: No results for input(s): PROCALCITON, WBC, LATICACIDVEN in the last 168 hours.  Microbiology Recent Results (from the past 240 hours)  Culture, blood (routine x 2)     Status: None   Collection Time: 06/27/23  8:55 AM   Specimen: BLOOD RIGHT ARM  Result Value Ref Range Status   Specimen Description BLOOD RIGHT ARM  Final   Special Requests   Final    BOTTLES DRAWN AEROBIC AND ANAEROBIC  Blood Culture results may not be optimal due to an inadequate volume of blood received in culture bottles   Culture   Final    NO GROWTH 5 DAYS Performed at [facility], [address] [place], [place] [address]    Report Status 07/02/2023 FINAL  Final  Culture, blood (routine x 2)     Status: None   Collection Time: 06/27/23 10:08 AM   Specimen: BLOOD RIGHT HAND  Result Value Ref Range Status   Specimen Description BLOOD RIGHT HAND  Final   Special Requests   Final    AEROBIC BOTTLE ONLY Blood Culture results may not be optimal due to an inadequate volume of blood received in culture bottles   Culture   Final    NO GROWTH 5 DAYS Performed at [facility], [address] [place], [place] [address]    Report Status 07/02/2023 FINAL  Final  Urine Culture     Status: None   Collection Time: 06/27/23  1:01 PM  Specimen: Urine, Catheterized  Result Value Ref Range Status   Specimen Description URINE, CATHETERIZED  Final   Special Requests NONE  Final   Culture   Final    NO GROWTH Performed at [facility], [address] [place], [place] [address]    Report Status 06/28/2023 FINAL  Final     Medications:    Chlorhexidine  Gluconate Cloth  6 each Topical Daily   clopidogrel   75 mg Oral Daily   gabapentin   300 mg Oral BID   heparin   5,000 Units Subcutaneous Q8H   levothyroxine   100 mcg Oral Daily   loratadine   10 mg Oral Daily   rosuvastatin   20 mg Oral Daily   sodium chloride  flush  3 mL Intravenous Q12H   tamsulosin   0.8 mg Oral Daily   Continuous Infusions:    LOS: 10 days   Steve Thompson  [facility]  07/07/2023, 7:51 AM

## 2023-07-07 NOTE — Plan of Care (Signed)

## 2023-07-08 DIAGNOSIS — N179 Acute kidney failure, unspecified: Secondary | ICD-10-CM | POA: Diagnosis not present

## 2023-07-08 DIAGNOSIS — N189 Chronic kidney disease, unspecified: Secondary | ICD-10-CM | POA: Diagnosis not present

## 2023-07-08 NOTE — Plan of Care (Signed)
   Problem: Activity: Goal: Risk for activity intolerance will decrease Outcome: Progressing   Problem: Nutrition: Goal: Adequate nutrition will be maintained Outcome: Progressing

## 2023-07-08 NOTE — Plan of Care (Signed)
°  Problem: Clinical Measurements: Goal: Will remain free from infection Outcome: Not Progressing   Problem: Elimination: Goal: Will not experience complications related to urinary retention Outcome: Not Progressing   Problem: Safety: Goal: Ability to remain free from injury will improve Outcome: Not Progressing   

## 2023-07-08 NOTE — Progress Notes (Signed)
 [facility] PROGRESS NOTE    Progress Note  Steve Thompson  FMW:[id number] DOB: 02-01-1931 DOA: 06/27/2023 PCP: Okey Carlin Redbird, MD     Brief Narrative:   Steve Thompson is an 89 y.o. male past medical history of cognitive impairment, myasthenia gravis previous status hypoparathyroidism, back pain BPH hypercalcemia presents to the hospital with generalized weakness poor oral intake and a fall at home, was found down by his wife on the day of admission.  He then talk, she relates he has been having urinary retention with dribbling and inability urinate for couple days.  He was seen by his just was consulted.  Foley and follow-up with them as an outpatient.  Assessment/Plan:   Acute kidney injury superimposed on chronic kidney disease likely secondary to BPH leading to urinary retention: Urology was consulted and Foley was placed will follow urology as an outpatient. Creatinine has returned to baseline. Continue Flomax . Awaiting insurance authorization for placement it was denied. Family is appeal insurance.  Elevated troponin/BNP: 2D echo showed no acute findings likely delayed clearing from acute kidney injury. Continue Plavix  and Crestor .  SIRS: Likely reactive sepsis being ruled out.  Generalized weakness: PT OT consulted.  Incidental lung nodule: CT of the chest showed 1.7 cm left lower lobe nodule. Pulmonary consulted will follow-up as an outpatient.  Hypovolemic hyponatremia: Resolved.  History of CVA: Continue Plavix  and statins.  Iatrogenic hypothyroidism: Continue Synthroid .  Chronic compression fracture T12 and L1: Noted.  Cognitive impairment: Noted.  History of ocular myasthenia gravis: Continue Mestinon  3 times a day follow-up with neurology as an outpatient.  Hyperlipidemia: Continue Crestor .  Hypokalemia/hypomagnesemia: Repleted now resolved.  Disposition PT OT evaluated the patient awaiting skilled nursing facility placement but  insurance denied. Peer-to-peer done awaiting callback.  Goals of care: Palliative care was consulted and having a meeting today at 1030.  DVT prophylaxis: lovenox  Family Communication:none Status is: Inpatient Remains inpatient appropriate because: Acute kidney injury    Code Status:     Code Status Orders  (From admission, onward)           Start     Ordered   06/27/23 1213  Full code  Continuous       Question:  By:  Answer:  Consent: discussion documented in EHR   06/27/23 1213           Code Status History     Date Active Date Inactive Code Status Order ID Comments User Context   02/25/2023 1606 02/28/2023 2201 Full Code [id number]  Cindy Garnette POUR, MD ED         IV Access:   Peripheral IV   Procedures and diagnostic studies:   No results found.   Medical Consultants:   None.   Subjective:    Steve Thompson no complaints  Objective:    Vitals:   07/07/23 0907 07/07/23 1516 07/07/23 1927 07/08/23 0426  BP: 116/69 111/65 117/66 125/67  Pulse: 79 73 85 79  Resp: 17 16 18 18   Temp: 98 F (36.7 C) 97.7 F (36.5 C) 98 F (36.7 C) 98.1 F (36.7 C)  TempSrc: Oral  Oral Oral  SpO2: 96% 93% 92% 92%  Weight:      Height:       SpO2: 92 % O2 Flow Rate (L/min): 4 L/min   Intake/Output Summary (Last 24 hours) at 07/08/2023 0810 Last data filed at 07/08/2023 0427 Gross per 24 hour  Intake 240 ml  Output 1750 ml  Net -1510  ml   Filed Weights   06/27/23 0845  Weight: 84 kg    Exam: General exam: In no acute distress. Respiratory system: Good air movement and clear to auscultation. Cardiovascular system: S1 & S2 heard, RRR. No JVD. Gastrointestinal system: Abdomen is nondistended, soft and nontender.  Extremities: No pedal edema. Skin: No rashes, lesions or ulcers Psychiatry: Judgement and insight appear normal. Mood & affect appropriate.  Data Reviewed:    Labs: Basic Metabolic Panel: No results for input(s): NA, K, CL,  CO2, GLUCOSE, BUN, CREATININE, CALCIUM , MG, PHOS in the last 168 hours.  GFR Estimated Creatinine Clearance: 62.6 mL/min (by C-G formula based on SCr of 0.72 mg/dL). Liver Function Tests: No results for input(s): AST, ALT, ALKPHOS, BILITOT, PROT, ALBUMIN in the last 168 hours.  No results for input(s): LIPASE, AMYLASE in the last 168 hours. No results for input(s): AMMONIA in the last 168 hours. Coagulation profile No results for input(s): INR, PROTIME in the last 168 hours. COVID-19 Labs  No results for input(s): DDIMER, FERRITIN, LDH, CRP in the last 72 hours.  No results found for: SARSCOV2NAA  CBC: No results for input(s): WBC, NEUTROABS, HGB, HCT, MCV, PLT in the last 168 hours.  Cardiac Enzymes: No results for input(s): CKTOTAL, CKMB, CKMBINDEX, TROPONINI in the last 168 hours. BNP (last 3 results) No results for input(s): PROBNP in the last 8760 hours. CBG: No results for input(s): GLUCAP in the last 168 hours. D-Dimer: No results for input(s): DDIMER in the last 72 hours. Hgb A1c: No results for input(s): HGBA1C in the last 72 hours. Lipid Profile: No results for input(s): CHOL, HDL, LDLCALC, TRIG, CHOLHDL, LDLDIRECT in the last 72 hours. Thyroid  function studies: No results for input(s): TSH, T4TOTAL, T3FREE, THYROIDAB in the last 72 hours.  Invalid input(s): FREET3 Anemia work up: No results for input(s): VITAMINB12, FOLATE, FERRITIN, TIBC, IRON, RETICCTPCT in the last 72 hours. Sepsis Labs: No results for input(s): PROCALCITON, WBC, LATICACIDVEN in the last 168 hours.  Microbiology No results found for this or any previous visit (from the past 240 hours).    Medications:    Chlorhexidine  Gluconate Cloth  6 each Topical Daily   clopidogrel   75 mg Oral Daily   gabapentin   300 mg Oral BID   heparin   5,000 Units Subcutaneous Q8H   levothyroxine    100 mcg Oral Daily   loratadine   10 mg Oral Daily   rosuvastatin   20 mg Oral Daily   sodium chloride  flush  3 mL Intravenous Q12H   tamsulosin   0.8 mg Oral Daily   Continuous Infusions:    LOS: 11 days   Erle Odell Castor  [facility]  07/08/2023, 8:10 AM

## 2023-07-08 NOTE — Progress Notes (Signed)
 Mobility Specialist Progress Note:    07/08/23 1100  Mobility  Activity Ambulated with assistance in hallway  Level of Assistance Contact guard assist, steadying assist  Assistive Device Front wheel walker  Distance Ambulated (ft) 120 ft  Activity Response Tolerated well  Mobility Referral Yes  Mobility visit 1 Mobility  Mobility Specialist Start Time (ACUTE ONLY) 1056  Mobility Specialist Stop Time (ACUTE ONLY) 1110  Mobility Specialist Time Calculation (min) (ACUTE ONLY) 14 min   Received pt in chair having no complaints and agreeable to mobility. Pt was asymptomatic throughout ambulation and returned to room w/o fault. Left in chair w/ call bell in reach and all needs met.   D'Vante Nicholaus Mobility Specialist Please contact via [employer] educational needs [employer] or Rehab office at [phone]

## 2023-07-09 ENCOUNTER — Other Ambulatory Visit (HOSPITAL_COMMUNITY): Payer: Self-pay

## 2023-07-09 DIAGNOSIS — N189 Chronic kidney disease, unspecified: Secondary | ICD-10-CM | POA: Diagnosis not present

## 2023-07-09 DIAGNOSIS — N179 Acute kidney failure, unspecified: Secondary | ICD-10-CM | POA: Diagnosis not present

## 2023-07-09 MED ORDER — CLOPIDOGREL BISULFATE 75 MG PO TABS
75.0000 mg | ORAL_TABLET | Freq: Every day | ORAL | 2 refills | Status: DC
  Filled 2023-07-09: qty 30, 30d supply, fill #0

## 2023-07-09 MED ORDER — GABAPENTIN 300 MG PO CAPS
ORAL_CAPSULE | ORAL | 0 refills | Status: DC
  Filled 2023-07-09: qty 90, 30d supply, fill #0

## 2023-07-09 MED ORDER — SODIUM CHLORIDE 0.9 % IV BOLUS
250.0000 mL | Freq: Once | INTRAVENOUS | Status: AC
  Administered 2023-07-09: 250 mL via INTRAVENOUS

## 2023-07-09 MED ORDER — TAMSULOSIN HCL 0.4 MG PO CAPS
0.8000 mg | ORAL_CAPSULE | Freq: Every day | ORAL | 0 refills | Status: DC
  Filled 2023-07-09: qty 30, 15d supply, fill #0

## 2023-07-09 NOTE — Progress Notes (Signed)
 Patient awaken at [facility] for routine vital signs, several attempt on bilateral arm made to check blood pressure but was showing hypotension, patient has no complain of dizziness. Dinamap and manual sphygmomanometer were used to check blood pressure, pulse was checked manually as well. Paged Lynwood Kipper, NP and bolus given after inserting a new peripheral line. Mahima RN updated.    07/09/23 0515 07/09/23 0517  Assess: MEWS Score  Temp 97.8 F (36.6 C)  --   BP (!) 88/63 (!) 70/50  Pulse Rate (!) 120 (!) 110  Resp 17 17  SpO2 93 % 93 %  O2 Device Room Air  --   Assess: MEWS Score  MEWS Temp 0 0  MEWS Systolic 1 2  MEWS Pulse 2 1  MEWS RR 0 0  MEWS LOC 0 0  MEWS Score 3 3  MEWS Score Color Yellow Yellow  Assess: if the MEWS score is Yellow or Red  Were vital signs accurate and taken at a resting state? Yes  --   Does the patient meet 2 or more of the SIRS criteria? No  --   MEWS guidelines implemented  Yes, yellow  --   Treat  MEWS Interventions Considered administering scheduled or prn medications/treatments as ordered  --   Take Vital Signs  Increase Vital Sign Frequency  Yellow: Q2hr x1, continue Q4hrs until patient remains green for 12hrs  --   Escalate  MEWS: Escalate Yellow: Discuss with charge nurse and consider notifying provider and/or RRT  --   Notify: Charge Nurse/RN  Name of Charge Nurse/RN Notified Mahima RN  --   Provider Notification  Provider Name/Title Lynwood Kipper NP  --   Date Provider Notified 07/09/23  --   Time Provider Notified [phone]  --   Method of Notification Page  --   Notification Reason  (yellow mews)  --   Provider response See new orders  --   Date of Provider Response 07/09/23  --   Time of Provider Response 0531  --   Assess: SIRS CRITERIA  SIRS Temperature  0 0  SIRS Respirations  0 0  SIRS Pulse 1 1  SIRS WBC 0 0  SIRS Score Sum  1 1

## 2023-07-09 NOTE — Progress Notes (Signed)
 [facility] PROGRESS NOTE    Progress Note  Steve Thompson  FMW:[id number] DOB: April 06, 1931 DOA: 06/27/2023 PCP: Okey Steve Redbird, MD     Brief Narrative:   Steve Thompson is an 89 y.o. male past medical history of cognitive impairment, myasthenia gravis previous status hypoparathyroidism, back pain BPH hypercalcemia presents to the hospital with generalized weakness poor oral intake and a fall at home, was found down by his wife on the day of admission.  He then talk, she relates he has been having urinary retention with dribbling and inability urinate for couple days.  He was seen by his just was consulted.  Foley and follow-up with them as an outpatient.  Assessment/Plan:   Acute kidney injury superimposed on chronic kidney disease likely secondary to BPH leading to urinary retention: Urology was consulted and Foley was placed will follow urology as an outpatient. Creatinine has returned to baseline. Continue Flomax . Awaiting insurance authorization for placement, he was denied. Family is appeal insurance.  Elevated troponin/BNP: 2D echo showed no acute findings likely delayed clearing from acute kidney injury. Continue Plavix  and Crestor .  SIRS: Likely reactive sepsis being ruled out.  Generalized weakness: PT OT consulted.  Incidental lung nodule: CT of the chest showed 1.7 cm left lower lobe nodule. Pulmonary consulted will follow-up as an outpatient.  Hypovolemic hyponatremia: Resolved.  History of CVA: Continue Plavix  and statins.  Iatrogenic hypothyroidism: Continue Synthroid .  Chronic compression fracture T12 and L1: Noted.  Cognitive impairment: Noted.  History of ocular myasthenia gravis: Continue Mestinon  3 times a day follow-up with neurology as an outpatient.  Hyperlipidemia: Continue Crestor .  Hypokalemia/hypomagnesemia: Repleted now resolved.  Disposition PT OT evaluated the patient awaiting skilled nursing facility placement but  insurance denied. Peer-to-peer done awaiting callback.  Goals of care: Palliative care was consulted and having a meeting today at 1030.  DVT prophylaxis: lovenox  Family Communication:none Status is: Inpatient Remains inpatient appropriate because: Acute kidney injury    Code Status:     Code Status Orders  (From admission, onward)           Start     Ordered   06/27/23 1213  Full code  Continuous       Question:  By:  Answer:  Consent: discussion documented in EHR   06/27/23 1213           Code Status History     Date Active Date Inactive Code Status Order ID Comments User Context   02/25/2023 1606 02/28/2023 2201 Full Code [id number]  Steve Garnette POUR, MD ED         IV Access:   Peripheral IV   Procedures and diagnostic studies:   No results found.   Medical Consultants:   None.   Subjective:    Steve Thompson no complains  Objective:    Vitals:   07/08/23 2006 07/09/23 0515 07/09/23 0517 07/09/23 0638  BP: 106/61 (!) 88/63 (!) 70/50 108/71  Pulse: 78 (!) 120 (!) 110 (!) 102  Resp: 17 17 17 17   Temp: 98 F (36.7 C) 97.8 F (36.6 C)  98 F (36.7 C)  TempSrc: Oral Oral  Oral  SpO2: 92% 93% 93% 92%  Weight:      Height:       SpO2: 92 % O2 Flow Rate (L/min): 4 L/min   Intake/Output Summary (Last 24 hours) at 07/09/2023 0805 Last data filed at 07/09/2023 0500 Gross per 24 hour  Intake 240 ml  Output 1450 ml  Net -1210 ml   Filed Weights   06/27/23 0845  Weight: 84 kg    Exam: General exam: In no acute distress. Respiratory system: Good air movement and clear to auscultation. Cardiovascular system: S1 & S2 heard, RRR. No JVD. Gastrointestinal system: Abdomen is nondistended, soft and nontender.  Extremities: No pedal edema. Skin: No rashes, lesions or ulcers Psychiatry: Judgement and insight appear normal. Mood & affect appropriate.  Data Reviewed:    Labs: Basic Metabolic Panel: No results for input(s): NA, K,  CL, CO2, GLUCOSE, BUN, CREATININE, CALCIUM , MG, PHOS in the last 168 hours.  GFR Estimated Creatinine Clearance: 62.6 mL/min (by C-G formula based on SCr of 0.72 mg/dL). Liver Function Tests: No results for input(s): AST, ALT, ALKPHOS, BILITOT, PROT, ALBUMIN in the last 168 hours.  No results for input(s): LIPASE, AMYLASE in the last 168 hours. No results for input(s): AMMONIA in the last 168 hours. Coagulation profile No results for input(s): INR, PROTIME in the last 168 hours. COVID-19 Labs  No results for input(s): DDIMER, FERRITIN, LDH, CRP in the last 72 hours.  No results found for: SARSCOV2NAA  CBC: No results for input(s): WBC, NEUTROABS, HGB, HCT, MCV, PLT in the last 168 hours.  Cardiac Enzymes: No results for input(s): CKTOTAL, CKMB, CKMBINDEX, TROPONINI in the last 168 hours. BNP (last 3 results) No results for input(s): PROBNP in the last 8760 hours. CBG: No results for input(s): GLUCAP in the last 168 hours. D-Dimer: No results for input(s): DDIMER in the last 72 hours. Hgb A1c: No results for input(s): HGBA1C in the last 72 hours. Lipid Profile: No results for input(s): CHOL, HDL, LDLCALC, TRIG, CHOLHDL, LDLDIRECT in the last 72 hours. Thyroid  function studies: No results for input(s): TSH, T4TOTAL, T3FREE, THYROIDAB in the last 72 hours.  Invalid input(s): FREET3 Anemia work up: No results for input(s): VITAMINB12, FOLATE, FERRITIN, TIBC, IRON, RETICCTPCT in the last 72 hours. Sepsis Labs: No results for input(s): PROCALCITON, WBC, LATICACIDVEN in the last 168 hours.  Microbiology No results found for this or any previous visit (from the past 240 hours).    Medications:    Chlorhexidine  Gluconate Cloth  6 each Topical Daily   clopidogrel   75 mg Oral Daily   gabapentin   300 mg Oral BID   heparin   5,000 Units Subcutaneous Q8H    levothyroxine   100 mcg Oral Daily   loratadine   10 mg Oral Daily   rosuvastatin   20 mg Oral Daily   sodium chloride  flush  3 mL Intravenous Q12H   tamsulosin   0.8 mg Oral Daily   Continuous Infusions:    LOS: 12 days   Steve Thompson  [facility]  07/09/2023, 8:05 AM

## 2023-07-09 NOTE — TOC Transition Note (Signed)
 Transition of Care [facility]) - Discharge Note   Patient Details  Name: Steve Thompson MRN: [id number] Date of Birth: [birth year]  Transition of Care [facility]) CM/SW Contact:  Steve Jon Bloch, RN Phone Number: 07/09/2023, 12:03 PM   Clinical Narrative:    Patient will DC to: home Anticipated DC date: 07/09/2023 Family notified: Transport by:  Pt has decided to d/c to home with home home health services.  Per MD patient ready for DC today. RN, patient, and patient's wife aware of DC plan.  Pt without provider preference  for home health services.Referral made with Amy / [employer] and accepted. Amy to f/u with HTA , PCS benefit with pt/wife. Pt without DME needs.  Pt agreeable to outpatient palliative care. Referral made with Melissa/ [facility] for outpatient palliative care, pt agreeable. Rx meds will be pick for pt from [facility] pharmacy by staff prior to d/c.  Wife to provide transportation to home. Post hospital f/u noted on AVS.    RNCM will sign off for now as intervention is no longer needed. Please consult us  again if new needs arise.    Final next level of care: Home w Home Health Services Barriers to Discharge: No Barriers Identified   Patient Goals and CMS Choice Patient states their goals for this hospitalization and ongoing recovery are:: back to normal CMS Medicare.gov Compare Post Acute Care list provided to:: Patient Represenative (must comment) (wife [name]) Choice offered to / list presented to : Spouse, Patient      Discharge Placement                       Discharge Plan and Services Additional resources added to the After Visit Summary for   In-house Referral: Clinical Social Work   Post Acute Care Choice: Skilled Nursing Facility                    HH Arranged: RN, PT, OT [facility] Agency: [facility] Date [facility] Agency Contacted: 07/09/23 Time HH Agency Contacted: 1202 Representative spoke with at [facility] Agency: Amy  Social Drivers of Health (SDOH)  Interventions SDOH Screenings   Food Insecurity: Patient Declined (06/28/2023)  Housing: Low Risk  (06/28/2023)  Transportation Needs: No Transportation Needs (06/28/2023)  Utilities: Patient Declined (06/28/2023)  Social Connections: Unknown (06/28/2023)  Tobacco Use: Medium Risk (06/27/2023)     Readmission Risk Interventions     No data to display

## 2023-07-09 NOTE — Progress Notes (Signed)
 Mobility Specialist Progress Note:    07/09/23 0958  Mobility  Activity Ambulated with assistance in hallway  Level of Assistance Contact guard assist, steadying assist  Assistive Device Front wheel walker  Distance Ambulated (ft) 120 ft  Activity Response Tolerated well  Mobility Referral Yes  Mobility visit 1 Mobility  Mobility Specialist Start Time (ACUTE ONLY) 0915  Mobility Specialist Stop Time (ACUTE ONLY) [id number]  Mobility Specialist Time Calculation (min) (ACUTE ONLY) 12 min   Received pt in bed agreeable to mobility. No physical assistance required. No c/o throughout ambulation. Returned to room w/o fault. Pt left in chair w/ chair alarm on. Personal belongings and call light within reach. All needs met.   Lavanda Pollack Mobility Specialist  Please contact via [employer] or  Rehab Office [phone]

## 2023-07-09 NOTE — Progress Notes (Signed)
 OT Cancellation Note  Patient Details Name: Steve Thompson MRN: [id number] DOB: Oct 04, 1931   Cancelled Treatment:    Reason Eval/Treat Not Completed: Fatigue/lethargy limiting ability to participate pt greeted asleep in recliner, pt politely declined session stating  I just want to sit right here. Pt reports he had already walked down the hallway. Will continue to follow and check back as time allows for OT session.   Ronal Mallie POUR., COTA/L Acute Rehabilitation Services [phone]   Ronal Mallie Needy 07/09/2023, 10:14 AM

## 2023-07-09 NOTE — TOC Progression Note (Addendum)
 Transition of Care [facility]) - Progression Note    Patient Details  Name: Steve Thompson MRN: [id number] Date of Birth: 28-Oct-1931  Transition [facility] Care [facility]) CM/SW Contact  Bridget Cordella Simmonds, LCSW Phone Number: 07/09/2023, 8:34 AM  Clinical Narrative:   CSW left message with HTA requesting update on the SNF auth appeal.  1010: TC HTA: pt appeal was denied on Friday but now has been sent for a second level review, which is still pending.    1100: CSW spoke with pt and wife Steve Thompson.  They no longer want to wait for SNF appeal, want to move towards DC home today.  Steve Thompson is meeting with [facility] aide agency at 2pm today.  Discussed HH services, Steve Thompson has some questions, RNCM in room and discussing HH.  CSW left message with daughter Steve Thompson with the above update.   Expected Discharge Plan: Skilled Nursing Facility Barriers to Discharge: Continued Medical Work up, SNF Pending bed offer  Expected Discharge Plan and Services In-house Referral: Clinical Social Work   Post Acute Care Choice: Skilled Nursing Facility Living arrangements for the past 2 months: Single Family Home Expected Discharge Date: 07/03/23                                     Social Determinants of Health (SDOH) Interventions SDOH Screenings   Food Insecurity: Patient Declined (06/28/2023)  Housing: Low Risk  (06/28/2023)  Transportation Needs: No Transportation Needs (06/28/2023)  Utilities: Patient Declined (06/28/2023)  Social Connections: Unknown (06/28/2023)  Tobacco Use: Medium Risk (06/27/2023)    Readmission Risk Interventions     No data to display

## 2023-07-09 NOTE — Plan of Care (Signed)
  Problem: Elimination: Goal: Will not experience complications related to urinary retention Outcome: Not Progressing   Problem: Safety: Goal: Ability to remain free from injury will improve Outcome: Not Progressing   

## 2023-07-09 NOTE — Discharge Summary (Signed)
 Physician Discharge Summary  Steve Thompson FMW:[id number] DOB: 1931-10-21 DOA: 06/27/2023  PCP: Okey Carlin Redbird, MD  Admit date: 06/27/2023 Discharge date: 07/09/23  Admitted From: Home Disposition: SNF Recommendations for Outpatient Follow-up:  Outpatient follow-up with urology for voiding trial Pulmonology to arrange outpatient follow-up for pulmonary nodule Check CMP and CBC in 1 week Please follow up on the following pending results: None   Discharge Condition: Stable CODE STATUS: Full code  Follow-up Information     [facility]. Schedule an appointment as soon as possible for a visit in 1 week(s).   Why: Urinary retention and BPH Contact information: [address] [place]  [address] [phone]        Okey Carlin Redbird, MD. Schedule an appointment as soon as possible for a visit in 1 week(s).   Specialty: Family Medicine Contact information: [address] [place] [address] [phone]         [facility] Follow up.   Why: home health services will be provided by [facility] health, start of care within 48 hours post discharge Contact information: [address] [place] [address] [phone]                 Hospital course 89 year old M with PMH of cognitive impairment, myasthenia gravis, frequent falls, hyperparathyroidism, chronic back pain, BPH, hypercalcemia and neuropathy presenting with generalized weakness, poor p.o. intake and fall at home.  Reportedly, he was found down on the floor by his wife barely able to talk the morning of his presentation.  He has been experiencing urinary retention with dribbling and inability to urinate effectively for couple of days.  He was scheduled to see urologist before presenting to the hospital.   In ED, stable vitals.  WBC 16.5. Cr 2.9.  BUN 24.  Troponin 22.  CK1 102.  CXR and UA without significant finding.  CT head showed  small left scalp soft tissue contusion without any acute intracranial abnormality.  CT chest, abdomen and pelvis noted a large superior segment of left lower lobe lung nodule since last year measuring 1.7 cm concerning for bronchogenic carcinoma, chronic T12 and L1 compression fractures and abnormal urinary bladder wall thickening despite decompression by Foley catheter.  Foley catheter inserted with return of 900 cc of urine.  Patient received IV fluid and empirically started on IV ceftriaxone .   Sepsis ruled out.  Antibiotics discontinued.  AKI resolved.  Patient is discharged with Foley catheter until outpatient follow-up with urology for voiding trial and further evaluation.   TTE with LVEF of 65 to 70%, moderate asymmetric LVH, indeterminate DD.  Therapy recommended SNF.   See individual problem list below for more.   Problems addressed during this hospitalization Acute kidney injury secondary to BPH with urinary retention: Resolving.  CT shows abnormal urinary bladder wall thickening despite decompression.  UA does not suggest UTI.  Urine culture NGTD. Recent Labs    08/23/22 1032 02/25/23 1420 02/25/23 1426 02/27/23 0440 06/27/23 0855 06/27/23 0908 06/28/23 0043 06/29/23 0639 06/30/23 1009  BUN 17 13 15 8  24* 24* 17 7* 8  CREATININE 0.95 0.87 0.80 0.85 2.95* 2.90* 1.19 0.78 0.72  - Continue Foley catheter until outpatient follow-up with urology   Acute urinary retention/BPH: 900 cc urine output after inserting Foley - Continue Foley catheter until outpatient follow-up with urology - Continue Flomax    Elevated troponin/BNP: Troponin trended from 22 and peaked up at 328  and trended down.  Patient denies cardiac symptoms although not a great historian.  TTE without significant finding.  Could be delayed clearance from AKI. -Continue home Plavix  and Crestor    SIRS: Likely reactive.  UA, urine culture and blood culture NGTD.  SIRS resolved.  Sepsis ruled out.   Fall at home/head  contusion/generalized weakness -Fall precaution, PT/OT   Lung nodule: CT chest showed enlarged 1.7 cm LLL nodule concerning for bronchogenic carcinoma. -Pulmonology consulted and will arrange outpatient follow-up   Hyponatremia:Likely hypovolemic from poor p.o. intake.  Resolved.   History of CVA: Had right nonhemorrhagic thalamocapsular ischemic stroke back in 02/2023.   -Continue Plavix  and statin   Postsurgical hypothyroidism: TSH normal - Continue home Synthroid    Compression fracture: Chronic T12 and L1 compression fractures noted on CT. pathology?   Cognitive impairment: -Reorientation and delirium precautions   History of ocular myasthenia gravis: Followed by neurology.  Per last neurology note in 02/2023, supposed to be on Mestinon  30 mg 3 times daily as needed.  Stable. -Outpatient follow-up with neurology -Avoid medication that could increase the risk of myasthenia crisis   Hyperlipidemia - Continue Crestor    Hypokalemia/hypomagnesemia: Resolved            Time spent 35 minutes  Vital signs Vitals:   07/09/23 0517 07/09/23 0638 07/09/23 0804 07/09/23 1130  BP: (!) 70/50 108/71 (!) 94/55 (!) 101/59  Pulse: (!) 110 (!) 102 96 77  Temp:  98 F (36.7 C) (!) 97.5 F (36.4 C) 97.8 F (36.6 C)  Resp: 17 17 18 18   Height:      Weight:      SpO2: 93% 92% 93% 93%  TempSrc:  Oral    BMI (Calculated):         Discharge exam  GENERAL: No apparent distress.  Nontoxic. HEENT: MMM.  Vision and hearing grossly intact.  NECK: Supple.  No apparent JVD.  RESP:  No IWOB.  Fair aeration bilaterally. CVS:  RRR. Heart sounds normal.  ABD/GI/GU: BS+. Abd soft, NTND.  Foley catheter in place. MSK/EXT:  Moves extremities. No apparent deformity. No edema.  SKIN: no apparent skin lesion or wound NEURO: AA.  Oriented x 4 except date.  No apparent focal neuro deficit. PSYCH: Calm. Normal affect.   Discharge Instructions Discharge Instructions     Diet - low sodium heart  healthy   Complete by: As directed    Diet general   Complete by: As directed    Discharge instructions   Complete by: As directed    It has been a pleasure taking care of you!  You were hospitalized due to acute kidney injury from urinary tension for which you had Foley catheter placed.  Your kidney function has recovered.  We are discharging you with Foley catheter until you see urology outpatient.  Please call the urologist office for outpatient follow-up in 1 to 2 weeks.   Take care,   Increase activity slowly   Complete by: As directed    Increase activity slowly   Complete by: As directed    Increase activity slowly   Complete by: As directed       Allergies as of 07/09/2023   No Known Allergies      Medication List     TAKE these medications    clopidogrel  75 MG tablet Commonly known as: PLAVIX  Take 1 tablet (75 mg total) by mouth daily.   gabapentin  300 MG capsule Commonly known as: NEURONTIN  Take 1 capsule (300  mg total) by mouth in the morning AND 2 capsules (600 mg total) at bedtime. What changed: See the new instructions.   levothyroxine  100 MCG tablet Commonly known as: SYNTHROID  Take 1 tablet (100 mcg total) by mouth daily.   loratadine  10 MG tablet Commonly known as: CLARITIN  Take 10 mg by mouth daily.   rosuvastatin  20 MG tablet Commonly known as: CRESTOR  Take 1 tablet (20 mg total) by mouth daily.   tamsulosin  0.4 MG Caps capsule Commonly known as: FLOMAX  Take 0.8 mg by mouth daily.        Consultations: None  Procedures/Studies:   ECHOCARDIOGRAM COMPLETE Result Date: 06/28/2023    ECHOCARDIOGRAM REPORT   Patient Name:   DARON STUTZ Date of Exam: 06/28/2023 Medical Rec #:  [id number]      Height:       69.0 in Accession #:    [id number]     Weight:       185.2 lb Date of Birth:  [birth year]      BSA:          2.000 m Patient Age:    89 years       BP:           140/81 mmHg Patient Gender: M              HR:           89 bpm. Exam  Location:  Inpatient Procedure: 2D Echo, Cardiac Doppler and Color Doppler (Both Spectral and Color            Flow Doppler were utilized during procedure). Indications:    Elevated Troponin  History:        Patient has prior history of Echocardiogram examinations, most                 recent 02/25/2023. TIA; Risk Factors:Former Smoker.  Sonographer:    Jayson Gaskins Referring Phys: ABIGAIL CHAVEZ IMPRESSIONS  1. Left ventricular ejection fraction, by estimation, is 65 to 70%. The left ventricle has normal function. The left ventricle has no regional wall motion abnormalities. There is moderate asymmetric left ventricular hypertrophy of the basal-septal segment. Indeterminate diastolic filling due to E-A fusion.  2. Right ventricular systolic function is normal. The right ventricular size is normal.  3. The mitral valve is grossly normal. Trivial mitral valve regurgitation. No evidence of mitral stenosis.  4. The aortic valve is tricuspid. There is moderate calcification of the aortic valve. There is mild thickening of the aortic valve. Aortic valve regurgitation is trivial. Aortic valve sclerosis/calcification is present, without any evidence of aortic stenosis.  5. The inferior vena cava is dilated in size with >50% respiratory variability, suggesting right atrial pressure of 8 mmHg. Comparison(s): No significant change from prior study. FINDINGS  Left Ventricle: Left ventricular ejection fraction, by estimation, is 65 to 70%. The left ventricle has normal function. The left ventricle has no regional wall motion abnormalities. The left ventricular internal cavity size was normal in size. There is  moderate asymmetric left ventricular hypertrophy of the basal-septal segment. Indeterminate diastolic filling due to E-A fusion. Right Ventricle: The right ventricular size is normal. Right vetricular wall thickness was not well visualized. Right ventricular systolic function is normal. Left Atrium: Left atrial size was  normal in size. Right Atrium: Right atrial size was normal in size. Pericardium: There is no evidence of pericardial effusion. Mitral Valve: The mitral valve is grossly normal. Trivial mitral valve regurgitation. No evidence of mitral  valve stenosis. Tricuspid Valve: The tricuspid valve is normal in structure. Tricuspid valve regurgitation is mild . No evidence of tricuspid stenosis. Aortic Valve: The aortic valve is tricuspid. There is moderate calcification of the aortic valve. There is mild thickening of the aortic valve. Aortic valve regurgitation is trivial. Aortic valve sclerosis/calcification is present, without any evidence of aortic stenosis. Aortic valve mean gradient measures 6.0 mmHg. Aortic valve peak gradient measures 9.1 mmHg. Aortic valve area, by VTI measures 2.10 cm. Pulmonic Valve: The pulmonic valve was not well visualized. Pulmonic valve regurgitation is not visualized. Aorta: The aortic root, ascending aorta and aortic arch are all structurally normal, with no evidence of dilitation or obstruction. Venous: The inferior vena cava is dilated in size with greater than 50% respiratory variability, suggesting right atrial pressure of 8 mmHg. IAS/Shunts: The atrial septum is grossly normal.  LEFT VENTRICLE PLAX 2D LVIDd:         4.00 cm LVIDs:         2.80 cm LV PW:         0.70 cm LV IVS:        1.40 cm LVOT diam:     2.00 cm LV SV:         64 LV SV Index:   32 LVOT Area:     3.14 cm  RIGHT VENTRICLE RV S prime:     20.80 cm/s TAPSE (M-mode): 3.1 cm LEFT ATRIUM             Index        RIGHT ATRIUM           Index LA Vol (A2C):   58.1 ml 29.06 ml/m  RA Area:     22.30 cm LA Vol (A4C):   45.6 ml 22.81 ml/m  RA Volume:   62.10 ml  31.06 ml/m LA Biplane Vol: 52.3 ml 26.16 ml/m  AORTIC VALVE AV Area (Vmax):    2.35 cm AV Area (Vmean):   2.28 cm AV Area (VTI):     2.10 cm AV Vmax:           151.00 cm/s AV Vmean:          114.000 cm/s AV VTI:            0.303 m AV Peak Grad:      9.1 mmHg AV  Mean Grad:      6.0 mmHg LVOT Vmax:         113.00 cm/s LVOT Vmean:        82.800 cm/s LVOT VTI:          0.203 m LVOT/AV VTI ratio: 0.67  AORTA Ao Root diam: 3.50 cm MITRAL VALVE MV Area (PHT): 5.27 cm     SHUNTS MV Decel Time: 144 msec     Systemic VTI:  0.20 m MV E velocity: 157.00 cm/s  Systemic Diam: 2.00 cm Shelda Bruckner MD Electronically signed by Shelda Bruckner MD Signature Date/Time: 06/28/2023/2:38:41 PM    Final    DG Foot Complete Left Result Date: 06/27/2023 CLINICAL DATA:  Fall and trauma to the left foot. EXAM: LEFT FOOT - COMPLETE 3+ VIEW COMPARISON:  None Available. FINDINGS: There is no acute fracture or dislocation. The bones are osteopenic. Degenerative changes of the first MTP joint. The soft tissues are unremarkable. IMPRESSION: 1. No acute fracture or dislocation. 2. Osteopenia. Electronically Signed   By: Vanetta Chou M.D.   On: 06/27/2023 12:16   CT CHEST ABDOMEN PELVIS WO  CONTRAST Result Date: 06/27/2023 CLINICAL DATA:  89 year old male found down this morning, hypotensive and unresponsive. Back pain. On Eliquis and Plavix . EXAM: CT CHEST, ABDOMEN AND PELVIS WITHOUT CONTRAST TECHNIQUE: Multidetector CT imaging of the chest, abdomen and pelvis was performed following the standard protocol without IV contrast. RADIATION DOSE REDUCTION: This exam was performed according to the departmental dose-optimization program which includes automated exposure control, adjustment of the mA and/or kV according to patient size and/or use of iterative reconstruction technique. COMPARISON:  Chest CT 03/03/2022. FINDINGS: CT CHEST FINDINGS Cardiovascular: Advanced Calcified aortic atherosclerosis. Mild cardiomegaly is new since last year. No pericardial effusion. Vascular patency is not evaluated in the absence of IV contrast. Advanced calcified coronary artery atherosclerosis. Mediastinum/Nodes: Chronic thyroidectomy. Noncontrast mediastinum appears negative for hematoma,  lymphadenopathy, mass. Lungs/Pleura: Lower lung volumes. Major airways remain patent with atelectatic changes. Dependent atelectasis in both lungs. No pneumothorax. Positive for small right pleural effusion. Chronic superior segment right lower lobe lung nodule appears progressed since last year, now appears to be mixed solid and sub solid, lobulated margins, 1.7 cm. See series 6, image 78. No convincing pulmonary contusion. Musculoskeletal: Thoracic vertebrae appear stable from last year, chronic T12 superior endplate compression. Visible shoulder osseous structures appear intact and aligned. No sternal fracture identified. No acute rib fracture identified. No superficial chest wall injury identified. CT ABDOMEN PELVIS FINDINGS Hepatobiliary: Noncontrast liver and gallbladder appear negative. No perihepatic fluid identified. Pancreas: Fatty atrophy. Spleen: Noncontrast spleen appears negative. No perisplenic fluid identified. Adrenals/Urinary Tract: Normal adrenal glands. Bilateral enlarged renal pelves (series 4, image 74), symmetric bilateral pararenal space inflammatory stranding which is nonspecific. Renal vascular calcifications. No definite collecting system calculus. Mild bilateral hydroureter and periureteral stranding which continues into the pelvis. In the pelvis the urinary bladder is decompressed by a Foley catheter but appears highly redundant, diffusely thick-walled, and inflamed (series 4, image 117). There is a right posterior 3.4 cm urinary bladder diverticulum which is not decompressed. Pelvic phleboliths. No definite obstructing urinary calculus. Stomach/Bowel: Decompressed large bowel from the splenic flexure distally. Extensive diverticulosis in the descending and sigmoid colon, no definite active inflammation. Decompressed transverse colon. Retained stool in the right colon. Evidence of normal retrocecal appendix on series 4, image 107. Diverticulosis in the distal small bowel. No active  inflammation identified. Small volume retained fluid in the stomach. No pneumoperitoneum. Pararenal space inflammatory stranding, no convincing peritoneal space stranding or free fluid. Vascular/Lymphatic: Extensive Aortoiliac calcified atherosclerosis. Vascular patency is not evaluated in the absence of IV contrast. Tortuous abdominal aorta without aneurysmal enlargement. No lymphadenopathy identified. There are several small chronic calcified left lower quadrant lymph nodes which appear to be postinflammatory. Reproductive: Urethral catheter in place.  Prostatomegaly. Other: Nonspecific presacral edema or stranding. Abnormal perivesical stranding, inflammation as detailed above. Musculoskeletal: L2 superior endplate compression was visible last year and appears stable. Lumbar vertebrae appear intact, stable compared to those visible last year. Sacrum, SI joints, pelvis, proximal femurs appear intact. IMPRESSION: 1. Enlarged superior segment left lower lobe lung nodule since last year, now 1.7 cm, highly suspicious for Bronchogenic Carcinoma. PET-CT and referral to [facility]) may be the next best step in evaluation. 2. No acute traumatic injury identified in the noncontrast chest, abdomen, or pelvis. Chronic T12 and L2 compression fractures. 3. Abnormal urinary bladder appears thick-walled and inflamed, despite decompression by Foley catheter. And there is bilateral ureter and renal pelvis enlargement with symmetric perinephric space inflammation which is probably reflecting recent bilateral obstructive uropathy. Urinary  infection not excluded. 4. Small right pleural effusion. Lower lung volumes with atelectasis. 5.  Aortic Atherosclerosis (ICD10-I70.0). Electronically Signed   By: VEAR Hurst M.D.   On: 06/27/2023 10:50   CT Cervical Spine Wo Contrast Result Date: 06/27/2023 CLINICAL DATA:  89 year old male found down this morning, hypotensive and unresponsive. Back pain. On  Eliquis and Plavix . EXAM: CT CERVICAL SPINE WITHOUT CONTRAST TECHNIQUE: Multidetector CT imaging of the cervical spine was performed without intravenous contrast. Multiplanar CT image reconstructions were also generated. RADIATION DOSE REDUCTION: This exam was performed according to the departmental dose-optimization program which includes automated exposure control, adjustment of the mA and/or kV according to patient size and/or use of iterative reconstruction technique. COMPARISON:  Head CT today.  Prior CTA head and neck 02/25/2023. FINDINGS: Alignment: Stable. Exaggerated upper cervical lordosis. Levoconvex cervical scoliosis. Cervicothoracic junction alignment is within normal limits. Stable posterior element alignment. Skull base and vertebrae: Visualized skull base is intact. No atlanto-occipital dissociation. C1 and C2 appear intact and aligned. No acute osseous abnormality identified. Soft tissues and spinal canal: No prevertebral fluid or swelling. No visible canal hematoma. Negative visible noncontrast neck soft tissues aside from calcified carotid atherosclerosis. Disc levels: Advanced chronic cervical spine degeneration appears stable from the CTA in February. Upper chest: Chest CT today reported separately. IMPRESSION: 1. No acute traumatic injury identified in the cervical spine. 2. Advanced chronic cervical spine degeneration appears stable from a CTA in February. 3. Chest CT today reported separately. Electronically Signed   By: VEAR Hurst M.D.   On: 06/27/2023 10:28   CT Head Wo Contrast Result Date: 06/27/2023 CLINICAL DATA:  89 year old male found down this morning, hypotensive and unresponsive. Back pain. On Eliquis and Plavix . EXAM: CT HEAD WITHOUT CONTRAST TECHNIQUE: Contiguous axial images were obtained from the base of the skull through the vertex without intravenous contrast. RADIATION DOSE REDUCTION: This exam was performed according to the departmental dose-optimization program which  includes automated exposure control, adjustment of the mA and/or kV according to patient size and/or use of iterative reconstruction technique. COMPARISON:  Brain MRI 02/25/2023.  Head CT 02/25/2023. FINDINGS: Brain: Stable cerebral volume. No midline shift, ventriculomegaly, mass effect, evidence of mass lesion, intracranial hemorrhage or evidence of cortically based acute infarction. Expected evolution, cystic encephalomalacia in the right thalamus from February lacune. Elsewhere stable gray-white differentiation. Vascular: Chronic arterial tortuosity. Calcified atherosclerosis at the skull base. Skull: Stable.  No acute osseous abnormality identified. Sinuses/Orbits: Visualized paranasal sinuses and mastoids are stable and well aerated. Other: Mild left anterior and lateral convexity asymmetric scalp soft tissue swelling suspicious for mild hematoma/contusion series 3, image 62. No scalp soft tissue gas. Underlying calvarium appears intact. Orbits soft tissues appears stable. IMPRESSION: 1. Mild left scalp soft tissue swelling suspicious for contusion/hematoma. No skull fracture identified. 2. No acute intracranial abnormality. Expected evolution of a right thalamic lacune since February. Electronically Signed   By: VEAR Hurst M.D.   On: 06/27/2023 10:22   DG Chest Portable 1 View Result Date: 06/27/2023 CLINICAL DATA:  Fall. EXAM: PORTABLE CHEST 1 VIEW COMPARISON:  10/19/2022. FINDINGS: The heart size and mediastinal contours are within normal limits. Aortic atherosclerosis. No focal consolidation, sizeable pleural effusion, or pneumothorax. No acute osseous abnormality identified. IMPRESSION: No acute findings in the chest. Electronically Signed   By: Harrietta Sherry M.D.   On: 06/27/2023 09:48       The results of significant diagnostics from this hospitalization (including imaging, microbiology, ancillary and laboratory) are listed below for  reference.     Microbiology: No results found for this or  any previous visit (from the past 240 hours).    Labs:  CBC: No results for input(s): WBC, NEUTROABS, HGB, HCT, MCV, PLT in the last 168 hours.  BMP &GFR No results for input(s): NA, K, CL, CO2, GLUCOSE, BUN, CREATININE, CALCIUM , MG, PHOS in the last 168 hours.  Invalid input(s): GFRCG  Estimated Creatinine Clearance: 62.6 mL/min (by C-G formula based on SCr of 0.72 mg/dL). Liver & Pancreas: No results for input(s): AST, ALT, ALKPHOS, BILITOT, PROT, ALBUMIN in the last 168 hours.  No results for input(s): LIPASE, AMYLASE in the last 168 hours. No results for input(s): AMMONIA in the last 168 hours. Diabetic: No results for input(s): HGBA1C in the last 72 hours. No results for input(s): GLUCAP in the last 168 hours. Cardiac Enzymes: No results for input(s): CKTOTAL, CKMB, CKMBINDEX, TROPONINI in the last 168 hours.  No results for input(s): PROBNP in the last 8760 hours. Coagulation Profile: No results for input(s): INR, PROTIME in the last 168 hours. Thyroid  Function Tests: No results for input(s): TSH, T4TOTAL, FREET4, T3FREE, THYROIDAB in the last 72 hours.  Lipid Profile: No results for input(s): CHOL, HDL, LDLCALC, TRIG, CHOLHDL, LDLDIRECT in the last 72 hours. Anemia Panel: No results for input(s): VITAMINB12, FOLATE, FERRITIN, TIBC, IRON, RETICCTPCT in the last 72 hours. Urine analysis:    Component Value Date/Time   COLORURINE YELLOW 06/27/2023 0917   APPEARANCEUR CLEAR 06/27/2023 0917   LABSPEC 1.008 06/27/2023 0917   PHURINE 6.0 06/27/2023 0917   GLUCOSEU NEGATIVE 06/27/2023 0917   HGBUR SMALL (A) 06/27/2023 0917   BILIRUBINUR NEGATIVE 06/27/2023 0917   KETONESUR NEGATIVE 06/27/2023 0917   PROTEINUR NEGATIVE 06/27/2023 0917   UROBILINOGEN 0.2 10/10/2010 1220   NITRITE NEGATIVE 06/27/2023 0917   LEUKOCYTESUR NEGATIVE 06/27/2023 0917   Sepsis Labs: Invalid  input(s): PROCALCITONIN, LACTICIDVEN   SIGNED:  Erle Odell Castor, MD  [facility] 07/09/2023, 12:12 PM

## 2023-07-09 NOTE — Progress Notes (Signed)
 [place] [address] [facility] Liaison Note:  Notified by [facility] manager of patient/family request for [facility]iative services at home after discharge.   Please call with any hospice or outpatient palliative care related questions.   Thank you for the opportunity to participate in this patient's care.   Elouise Husband, BSN, RN, OCN [employer] [phone]

## 2023-07-10 ENCOUNTER — Telehealth: Payer: Self-pay | Admitting: Adult Health

## 2023-07-10 ENCOUNTER — Other Ambulatory Visit (HOSPITAL_COMMUNITY): Payer: Self-pay

## 2023-07-10 DIAGNOSIS — S22088D Other fracture of T11-T12 vertebra, subsequent encounter for fracture with routine healing: Secondary | ICD-10-CM | POA: Diagnosis not present

## 2023-07-10 DIAGNOSIS — N401 Enlarged prostate with lower urinary tract symptoms: Secondary | ICD-10-CM | POA: Diagnosis not present

## 2023-07-10 DIAGNOSIS — R338 Other retention of urine: Secondary | ICD-10-CM | POA: Diagnosis not present

## 2023-07-10 DIAGNOSIS — G8929 Other chronic pain: Secondary | ICD-10-CM | POA: Diagnosis not present

## 2023-07-10 DIAGNOSIS — G7000 Myasthenia gravis without (acute) exacerbation: Secondary | ICD-10-CM | POA: Diagnosis not present

## 2023-07-10 DIAGNOSIS — G629 Polyneuropathy, unspecified: Secondary | ICD-10-CM | POA: Diagnosis not present

## 2023-07-10 DIAGNOSIS — R911 Solitary pulmonary nodule: Secondary | ICD-10-CM | POA: Diagnosis not present

## 2023-07-10 DIAGNOSIS — E785 Hyperlipidemia, unspecified: Secondary | ICD-10-CM | POA: Diagnosis not present

## 2023-07-10 DIAGNOSIS — E213 Hyperparathyroidism, unspecified: Secondary | ICD-10-CM | POA: Diagnosis not present

## 2023-07-10 DIAGNOSIS — S32018D Other fracture of first lumbar vertebra, subsequent encounter for fracture with routine healing: Secondary | ICD-10-CM | POA: Diagnosis not present

## 2023-07-10 DIAGNOSIS — E871 Hypo-osmolality and hyponatremia: Secondary | ICD-10-CM | POA: Diagnosis not present

## 2023-07-10 DIAGNOSIS — N189 Chronic kidney disease, unspecified: Secondary | ICD-10-CM | POA: Diagnosis not present

## 2023-07-11 ENCOUNTER — Other Ambulatory Visit (HOSPITAL_COMMUNITY): Payer: Self-pay

## 2023-07-16 ENCOUNTER — Telehealth: Payer: Self-pay | Admitting: Adult Health

## 2023-07-16 DIAGNOSIS — G7000 Myasthenia gravis without (acute) exacerbation: Secondary | ICD-10-CM

## 2023-07-16 MED ORDER — PYRIDOSTIGMINE BROMIDE 60 MG PO TABS
ORAL_TABLET | ORAL | 3 refills | Status: DC
Start: 2023-07-16 — End: 2023-09-12

## 2023-07-16 NOTE — Telephone Encounter (Signed)
 Spoke w/Pt wife regarding medication sent to pharmacy of [facility]  60 mg tabs to take 0.5 tab to 1 tab up to 3 x/day as needed for recurrent MG symptoms such as double vision. Also informed wife per provider, if Pt has worsening muscle weakness, swallowing, droopy eye lids, he needs to let us  know. Wife voiced understanding and thanks for the call.

## 2023-07-16 NOTE — Telephone Encounter (Signed)
 Spoke w/Pt wife regarding medication. Wife states Pt was in the hospital for 12 days and came home last Monday. Wife stated PT is working with Pt at home and PT asked why Pt is not still on M[facility] . Discussed with Pt wife that as of 03/14/23 Pt should still be on [facility] . Pt wife stated she did not know when he stopped taking the medication that according to the bottle they have at home the medication ran out in January. Informed Pt wife a message will be sent to Dr. Onita as Harlene is not in the office today. Wife voiced understanding and thanks for the call back.

## 2023-07-16 NOTE — Telephone Encounter (Signed)
 Meds ordered this encounter  Medications   pyridostigmine  (MESTINON ) 60 MG tablet    Sig: 0.5 to one tab up to 3 times a day as needed.    Dispense:  60 tablet    Refill:  3     I did sent in [facility]  Rx to his pharmacy, he can take it as needed for recurrent myasthenia gravis symptoms, such as double vision, if he has worsening muscle weakness, such as trouble swallowing, droopy eye lid, he needs to let us  know

## 2023-07-16 NOTE — Telephone Encounter (Signed)
 Pt's wife is asking for a call to discuss having Dr Onita to prescribe  pyridostigmine  (MESTINON ) 60 MG tablet [[id number]]  again, she says pt's PT wants him on this to help prevent falls, please call.

## 2023-07-17 DIAGNOSIS — R338 Other retention of urine: Secondary | ICD-10-CM | POA: Diagnosis not present

## 2023-07-23 DIAGNOSIS — N179 Acute kidney failure, unspecified: Secondary | ICD-10-CM | POA: Diagnosis not present

## 2023-07-23 DIAGNOSIS — R899 Unspecified abnormal finding in specimens from other organs, systems and tissues: Secondary | ICD-10-CM | POA: Diagnosis not present

## 2023-07-23 DIAGNOSIS — Z09 Encounter for follow-up examination after completed treatment for conditions other than malignant neoplasm: Secondary | ICD-10-CM | POA: Diagnosis not present

## 2023-07-23 DIAGNOSIS — Z6824 Body mass index (BMI) 24.0-24.9, adult: Secondary | ICD-10-CM | POA: Diagnosis not present

## 2023-07-23 DIAGNOSIS — G7000 Myasthenia gravis without (acute) exacerbation: Secondary | ICD-10-CM | POA: Diagnosis not present

## 2023-07-23 DIAGNOSIS — R413 Other amnesia: Secondary | ICD-10-CM | POA: Diagnosis not present

## 2023-08-02 DIAGNOSIS — R338 Other retention of urine: Secondary | ICD-10-CM | POA: Diagnosis not present

## 2023-08-17 ENCOUNTER — Other Ambulatory Visit: Payer: Self-pay | Admitting: Adult Health

## 2023-08-17 DIAGNOSIS — I6523 Occlusion and stenosis of bilateral carotid arteries: Secondary | ICD-10-CM

## 2023-08-20 ENCOUNTER — Emergency Department (HOSPITAL_COMMUNITY)

## 2023-08-20 ENCOUNTER — Inpatient Hospital Stay (HOSPITAL_COMMUNITY): Admission: EM | Admit: 2023-08-20 | Discharge: 2023-08-23 | DRG: 698 | Disposition: A | Discharge status: Hospice

## 2023-08-20 ENCOUNTER — Inpatient Hospital Stay (HOSPITAL_COMMUNITY)

## 2023-08-20 DIAGNOSIS — R579 Shock, unspecified: Secondary | ICD-10-CM | POA: Diagnosis not present

## 2023-08-20 DIAGNOSIS — Z87891 Personal history of nicotine dependence: Secondary | ICD-10-CM

## 2023-08-20 DIAGNOSIS — Y738 Miscellaneous gastroenterology and urology devices associated with adverse incidents, not elsewhere classified: Secondary | ICD-10-CM | POA: Diagnosis present

## 2023-08-20 DIAGNOSIS — W06XXXA Fall from bed, initial encounter: Secondary | ICD-10-CM | POA: Diagnosis not present

## 2023-08-20 DIAGNOSIS — E039 Hypothyroidism, unspecified: Secondary | ICD-10-CM | POA: Diagnosis not present

## 2023-08-20 DIAGNOSIS — E871 Hypo-osmolality and hyponatremia: Secondary | ICD-10-CM | POA: Diagnosis not present

## 2023-08-20 DIAGNOSIS — A419 Sepsis, unspecified organism: Secondary | ICD-10-CM | POA: Diagnosis not present

## 2023-08-20 DIAGNOSIS — Z1152 Encounter for screening for COVID-19: Secondary | ICD-10-CM

## 2023-08-20 DIAGNOSIS — E872 Acidosis, unspecified: Secondary | ICD-10-CM | POA: Diagnosis not present

## 2023-08-20 DIAGNOSIS — N323 Diverticulum of bladder: Secondary | ICD-10-CM | POA: Diagnosis present

## 2023-08-20 DIAGNOSIS — N4889 Other specified disorders of penis: Secondary | ICD-10-CM | POA: Diagnosis present

## 2023-08-20 DIAGNOSIS — J9601 Acute respiratory failure with hypoxia: Secondary | ICD-10-CM | POA: Diagnosis not present

## 2023-08-20 DIAGNOSIS — S51011A Laceration without foreign body of right elbow, initial encounter: Secondary | ICD-10-CM | POA: Diagnosis not present

## 2023-08-20 DIAGNOSIS — T83028A Displacement of other indwelling urethral catheter, initial encounter: Secondary | ICD-10-CM | POA: Diagnosis not present

## 2023-08-20 DIAGNOSIS — R6521 Severe sepsis with septic shock: Secondary | ICD-10-CM | POA: Diagnosis present

## 2023-08-20 DIAGNOSIS — N3001 Acute cystitis with hematuria: Secondary | ICD-10-CM | POA: Diagnosis present

## 2023-08-20 DIAGNOSIS — R911 Solitary pulmonary nodule: Secondary | ICD-10-CM | POA: Diagnosis present

## 2023-08-20 DIAGNOSIS — F0390 Unspecified dementia without behavioral disturbance: Secondary | ICD-10-CM | POA: Diagnosis present

## 2023-08-20 DIAGNOSIS — N138 Other obstructive and reflux uropathy: Secondary | ICD-10-CM | POA: Diagnosis not present

## 2023-08-20 DIAGNOSIS — R0902 Hypoxemia: Secondary | ICD-10-CM | POA: Diagnosis not present

## 2023-08-20 DIAGNOSIS — I21A1 Myocardial infarction type 2: Secondary | ICD-10-CM | POA: Diagnosis not present

## 2023-08-20 DIAGNOSIS — Z9189 Other specified personal risk factors, not elsewhere classified: Secondary | ICD-10-CM

## 2023-08-20 DIAGNOSIS — R339 Retention of urine, unspecified: Secondary | ICD-10-CM | POA: Diagnosis not present

## 2023-08-20 DIAGNOSIS — I5021 Acute systolic (congestive) heart failure: Secondary | ICD-10-CM | POA: Diagnosis not present

## 2023-08-20 DIAGNOSIS — I110 Hypertensive heart disease with heart failure: Secondary | ICD-10-CM | POA: Diagnosis not present

## 2023-08-20 DIAGNOSIS — Z515 Encounter for palliative care: Secondary | ICD-10-CM

## 2023-08-20 DIAGNOSIS — I700 Atherosclerosis of aorta: Secondary | ICD-10-CM | POA: Diagnosis not present

## 2023-08-20 DIAGNOSIS — E876 Hypokalemia: Secondary | ICD-10-CM | POA: Diagnosis not present

## 2023-08-20 DIAGNOSIS — M1990 Unspecified osteoarthritis, unspecified site: Secondary | ICD-10-CM | POA: Diagnosis present

## 2023-08-20 DIAGNOSIS — R9431 Abnormal electrocardiogram [ECG] [EKG]: Secondary | ICD-10-CM

## 2023-08-20 DIAGNOSIS — R739 Hyperglycemia, unspecified: Secondary | ICD-10-CM | POA: Diagnosis present

## 2023-08-20 DIAGNOSIS — I10 Essential (primary) hypertension: Secondary | ICD-10-CM | POA: Diagnosis not present

## 2023-08-20 DIAGNOSIS — A4181 Sepsis due to Enterococcus: Secondary | ICD-10-CM | POA: Diagnosis not present

## 2023-08-20 DIAGNOSIS — G7000 Myasthenia gravis without (acute) exacerbation: Secondary | ICD-10-CM | POA: Diagnosis present

## 2023-08-20 DIAGNOSIS — S299XXA Unspecified injury of thorax, initial encounter: Secondary | ICD-10-CM | POA: Diagnosis not present

## 2023-08-20 DIAGNOSIS — N401 Enlarged prostate with lower urinary tract symptoms: Secondary | ICD-10-CM | POA: Diagnosis present

## 2023-08-20 DIAGNOSIS — I3139 Other pericardial effusion (noninflammatory): Secondary | ICD-10-CM | POA: Diagnosis present

## 2023-08-20 DIAGNOSIS — I4892 Unspecified atrial flutter: Secondary | ICD-10-CM | POA: Diagnosis present

## 2023-08-20 DIAGNOSIS — Z7189 Other specified counseling: Secondary | ICD-10-CM

## 2023-08-20 DIAGNOSIS — R1111 Vomiting without nausea: Secondary | ICD-10-CM | POA: Diagnosis not present

## 2023-08-20 DIAGNOSIS — E785 Hyperlipidemia, unspecified: Secondary | ICD-10-CM | POA: Diagnosis not present

## 2023-08-20 DIAGNOSIS — G928 Other toxic encephalopathy: Secondary | ICD-10-CM | POA: Diagnosis not present

## 2023-08-20 DIAGNOSIS — N400 Enlarged prostate without lower urinary tract symptoms: Secondary | ICD-10-CM | POA: Diagnosis not present

## 2023-08-20 DIAGNOSIS — N23 Unspecified renal colic: Secondary | ICD-10-CM | POA: Diagnosis present

## 2023-08-20 DIAGNOSIS — Z79890 Hormone replacement therapy (postmenopausal): Secondary | ICD-10-CM

## 2023-08-20 DIAGNOSIS — R918 Other nonspecific abnormal finding of lung field: Secondary | ICD-10-CM | POA: Diagnosis not present

## 2023-08-20 DIAGNOSIS — R419 Unspecified symptoms and signs involving cognitive functions and awareness: Secondary | ICD-10-CM | POA: Diagnosis not present

## 2023-08-20 DIAGNOSIS — S3993XA Unspecified injury of pelvis, initial encounter: Secondary | ICD-10-CM | POA: Diagnosis not present

## 2023-08-20 DIAGNOSIS — D649 Anemia, unspecified: Secondary | ICD-10-CM | POA: Diagnosis not present

## 2023-08-20 DIAGNOSIS — R578 Other shock: Secondary | ICD-10-CM | POA: Diagnosis present

## 2023-08-20 DIAGNOSIS — B952 Enterococcus as the cause of diseases classified elsewhere: Secondary | ICD-10-CM | POA: Diagnosis not present

## 2023-08-20 DIAGNOSIS — Z8673 Personal history of transient ischemic attack (TIA), and cerebral infarction without residual deficits: Secondary | ICD-10-CM

## 2023-08-20 DIAGNOSIS — Z79899 Other long term (current) drug therapy: Secondary | ICD-10-CM

## 2023-08-20 DIAGNOSIS — N134 Hydroureter: Secondary | ICD-10-CM | POA: Diagnosis not present

## 2023-08-20 DIAGNOSIS — Z7401 Bed confinement status: Secondary | ICD-10-CM | POA: Diagnosis not present

## 2023-08-20 DIAGNOSIS — N179 Acute kidney failure, unspecified: Secondary | ICD-10-CM | POA: Diagnosis not present

## 2023-08-20 DIAGNOSIS — I4891 Unspecified atrial fibrillation: Secondary | ICD-10-CM | POA: Diagnosis not present

## 2023-08-20 DIAGNOSIS — R296 Repeated falls: Secondary | ICD-10-CM | POA: Diagnosis present

## 2023-08-20 DIAGNOSIS — R7881 Bacteremia: Secondary | ICD-10-CM | POA: Diagnosis not present

## 2023-08-20 DIAGNOSIS — R4182 Altered mental status, unspecified: Secondary | ICD-10-CM | POA: Diagnosis not present

## 2023-08-20 DIAGNOSIS — R338 Other retention of urine: Secondary | ICD-10-CM | POA: Diagnosis not present

## 2023-08-20 DIAGNOSIS — E162 Hypoglycemia, unspecified: Secondary | ICD-10-CM | POA: Diagnosis present

## 2023-08-20 DIAGNOSIS — T83021A Displacement of indwelling urethral catheter, initial encounter: Secondary | ICD-10-CM | POA: Diagnosis present

## 2023-08-20 DIAGNOSIS — N1330 Unspecified hydronephrosis: Secondary | ICD-10-CM | POA: Diagnosis not present

## 2023-08-20 DIAGNOSIS — G629 Polyneuropathy, unspecified: Secondary | ICD-10-CM | POA: Diagnosis present

## 2023-08-20 DIAGNOSIS — E663 Overweight: Secondary | ICD-10-CM | POA: Diagnosis present

## 2023-08-20 DIAGNOSIS — S3994XA Unspecified injury of external genitals, initial encounter: Secondary | ICD-10-CM | POA: Diagnosis not present

## 2023-08-20 DIAGNOSIS — T83518A Infection and inflammatory reaction due to other urinary catheter, initial encounter: Principal | ICD-10-CM | POA: Diagnosis present

## 2023-08-20 DIAGNOSIS — R000 Tachycardia, unspecified: Secondary | ICD-10-CM | POA: Diagnosis not present

## 2023-08-20 DIAGNOSIS — Z66 Do not resuscitate: Secondary | ICD-10-CM | POA: Diagnosis not present

## 2023-08-20 DIAGNOSIS — N368 Other specified disorders of urethra: Secondary | ICD-10-CM | POA: Diagnosis present

## 2023-08-20 DIAGNOSIS — Z7902 Long term (current) use of antithrombotics/antiplatelets: Secondary | ICD-10-CM

## 2023-08-20 DIAGNOSIS — I441 Atrioventricular block, second degree: Secondary | ICD-10-CM | POA: Diagnosis present

## 2023-08-20 DIAGNOSIS — R110 Nausea: Secondary | ICD-10-CM | POA: Diagnosis not present

## 2023-08-20 LAB — I-STAT VENOUS BLOOD GAS, ED
Acid-base deficit: 3 mmol/L — ABNORMAL HIGH (ref 0.0–2.0)
Bicarbonate: 22.6 mmol/L (ref 20.0–28.0)
Calcium, Ion: 1.15 mmol/L (ref 1.15–1.40)
HCT: 37 % — ABNORMAL LOW (ref 39.0–52.0)
Hemoglobin: 12.6 g/dL — ABNORMAL LOW (ref 13.0–17.0)
O2 Saturation: 98 %
Potassium: 3 mmol/L — ABNORMAL LOW (ref 3.5–5.1)
Sodium: 140 mmol/L (ref 135–145)
TCO2: 24 mmol/L (ref 22–32)
pCO2, Ven: 43 mmHg — ABNORMAL LOW (ref 44–60)
pH, Ven: 7.329 (ref 7.25–7.43)
pO2, Ven: 123 mmHg — ABNORMAL HIGH (ref 32–45)

## 2023-08-20 LAB — BLOOD CULTURE ID PANEL (REFLEXED) - BCID2

## 2023-08-20 LAB — URINALYSIS, ROUTINE W REFLEX MICROSCOPIC
Bacteria, UA: NONE SEEN
Bilirubin Urine: NEGATIVE
Glucose, UA: NEGATIVE mg/dL
Ketones, ur: NEGATIVE mg/dL
Nitrite: NEGATIVE
Protein, ur: 100 mg/dL — AB
RBC / HPF: >50 RBC/hpf (ref 0–5)
Specific Gravity, Urine: 1.03 (ref 1.005–1.030)
WBC, UA: >50 WBC/hpf (ref 0–5)
pH: 8 (ref 5.0–8.0)

## 2023-08-20 LAB — RESPIRATORY PANEL BY PCR

## 2023-08-20 LAB — CBC
HCT: 39.6 % (ref 39.0–52.0)
HCT: 38.5 % — ABNORMAL LOW (ref 39.0–52.0)
Hemoglobin: 12.5 g/dL — ABNORMAL LOW (ref 13.0–17.0)
Hemoglobin: 12.9 g/dL — ABNORMAL LOW (ref 13.0–17.0)
MCH: 29.6 pg (ref 26.0–34.0)
MCH: 30.6 pg (ref 26.0–34.0)
MCHC: 31.6 g/dL (ref 30.0–36.0)
MCHC: 33.5 g/dL (ref 30.0–36.0)
MCV: 93.6 fL (ref 80.0–100.0)
MCV: 91.4 fL (ref 80.0–100.0)
Platelets: 228 K/uL (ref 150–400)
Platelets: 189 K/uL (ref 150–400)
RBC: 4.23 MIL/uL (ref 4.22–5.81)
RBC: 4.21 MIL/uL — ABNORMAL LOW (ref 4.22–5.81)
RDW: 13.9 % (ref 11.5–15.5)
RDW: 14.2 % (ref 11.5–15.5)
WBC: 3.9 K/uL — ABNORMAL LOW (ref 4.0–10.5)
WBC: 19.5 K/uL — ABNORMAL HIGH (ref 4.0–10.5)
nRBC: 0.5 % — ABNORMAL HIGH (ref 0.0–0.2)
nRBC: 0 % (ref 0.0–0.2)

## 2023-08-20 LAB — BASIC METABOLIC PANEL WITH GFR
Anion gap: 13 (ref 5–15)
Anion gap: 13 (ref 5–15)
Anion gap: 12 (ref 5–15)
BUN: 16 mg/dL (ref 8–23)
BUN: 18 mg/dL (ref 8–23)
BUN: 22 mg/dL (ref 8–23)
CO2: 20 mmol/L — ABNORMAL LOW (ref 22–32)
CO2: 19 mmol/L — ABNORMAL LOW (ref 22–32)
CO2: 18 mmol/L — ABNORMAL LOW (ref 22–32)
Calcium: 8.3 mg/dL — ABNORMAL LOW (ref 8.9–10.3)
Calcium: 8 mg/dL — ABNORMAL LOW (ref 8.9–10.3)
Calcium: 8.1 mg/dL — ABNORMAL LOW (ref 8.9–10.3)
Chloride: 108 mmol/L (ref 98–111)
Chloride: 107 mmol/L (ref 98–111)
Chloride: 105 mmol/L (ref 98–111)
Creatinine, Ser: 1.17 mg/dL (ref 0.61–1.24)
Creatinine, Ser: 1.26 mg/dL — ABNORMAL HIGH (ref 0.61–1.24)
Creatinine, Ser: 1.33 mg/dL — ABNORMAL HIGH (ref 0.61–1.24)
GFR, Estimated: 59 mL/min — ABNORMAL LOW (ref >60)
GFR, Estimated: 54 mL/min — ABNORMAL LOW (ref >60)
GFR, Estimated: 51 mL/min — ABNORMAL LOW (ref >60)
Glucose, Bld: 134 mg/dL — ABNORMAL HIGH (ref 70–99)
Glucose, Bld: 163 mg/dL — ABNORMAL HIGH (ref 70–99)
Glucose, Bld: 216 mg/dL — ABNORMAL HIGH (ref 70–99)
Potassium: 2.6 mmol/L — CL (ref 3.5–5.1)
Potassium: 3.2 mmol/L — ABNORMAL LOW (ref 3.5–5.1)
Potassium: 3.6 mmol/L (ref 3.5–5.1)
Sodium: 141 mmol/L (ref 135–145)
Sodium: 139 mmol/L (ref 135–145)
Sodium: 135 mmol/L (ref 135–145)

## 2023-08-20 LAB — I-STAT CHEM 8, ED
BUN: 15 mg/dL (ref 8–23)
Calcium, Ion: 1.16 mmol/L (ref 1.15–1.40)
Chloride: 102 mmol/L (ref 98–111)
Creatinine, Ser: 1.1 mg/dL (ref 0.61–1.24)
Glucose, Bld: 208 mg/dL — ABNORMAL HIGH (ref 70–99)
HCT: 38 % — ABNORMAL LOW (ref 39.0–52.0)
Hemoglobin: 12.9 g/dL — ABNORMAL LOW (ref 13.0–17.0)
Potassium: 3.1 mmol/L — ABNORMAL LOW (ref 3.5–5.1)
Sodium: 140 mmol/L (ref 135–145)
TCO2: 22 mmol/L (ref 22–32)

## 2023-08-20 LAB — TROPONIN I (HIGH SENSITIVITY)
Troponin I (High Sensitivity): 21 ng/L — ABNORMAL HIGH (ref <18)
Troponin I (High Sensitivity): 277 ng/L (ref <18)
Troponin I (High Sensitivity): 1298 ng/L (ref <18)
Troponin I (High Sensitivity): 1926 ng/L (ref <18)

## 2023-08-20 LAB — ECHOCARDIOGRAM COMPLETE
Calc EF: 35 %
Height: 69.5 in
Single Plane A2C EF: 19.7 %
Single Plane A4C EF: 47.7 %
Weight: 2732.8 [oz_av]

## 2023-08-20 LAB — CBG MONITORING, ED: Glucose-Capillary: 170 mg/dL — ABNORMAL HIGH (ref 70–99)

## 2023-08-20 LAB — LIPASE, BLOOD: Lipase: 22 U/L (ref 11–51)

## 2023-08-20 LAB — COMPREHENSIVE METABOLIC PANEL WITH GFR
ALT: 13 U/L (ref 0–44)
AST: 23 U/L (ref 15–41)
Albumin: 3.2 g/dL — ABNORMAL LOW (ref 3.5–5.0)
Alkaline Phosphatase: 75 U/L (ref 38–126)
Anion gap: 15 (ref 5–15)
BUN: 14 mg/dL (ref 8–23)
CO2: 20 mmol/L — ABNORMAL LOW (ref 22–32)
Calcium: 9 mg/dL (ref 8.9–10.3)
Chloride: 104 mmol/L (ref 98–111)
Creatinine, Ser: 1.23 mg/dL (ref 0.61–1.24)
GFR, Estimated: 56 mL/min — ABNORMAL LOW (ref >60)
Glucose, Bld: 209 mg/dL — ABNORMAL HIGH (ref 70–99)
Potassium: 3.1 mmol/L — ABNORMAL LOW (ref 3.5–5.1)
Sodium: 139 mmol/L (ref 135–145)
Total Bilirubin: 1.5 mg/dL — ABNORMAL HIGH (ref 0.0–1.2)
Total Protein: 6.1 g/dL — ABNORMAL LOW (ref 6.5–8.1)

## 2023-08-20 LAB — PROCALCITONIN: Procalcitonin: 33.39 ng/mL

## 2023-08-20 LAB — PROTIME-INR
INR: 1.2 (ref 0.8–1.2)
Prothrombin Time: 15.4 s — ABNORMAL HIGH (ref 11.4–15.2)

## 2023-08-20 LAB — TSH: TSH: 0.88 u[IU]/mL (ref 0.350–4.500)

## 2023-08-20 LAB — BRAIN NATRIURETIC PEPTIDE: B Natriuretic Peptide: 178.2 pg/mL — ABNORMAL HIGH (ref 0.0–100.0)

## 2023-08-20 LAB — MRSA NEXT GEN BY PCR, NASAL: MRSA by PCR Next Gen: NOT DETECTED

## 2023-08-20 LAB — PREPARE RBC (CROSSMATCH)

## 2023-08-20 LAB — I-STAT CG4 LACTIC ACID, ED: Lactic Acid, Venous: 5 mmol/L (ref 0.5–1.9)

## 2023-08-20 LAB — RESP PANEL BY RT-PCR (RSV, FLU A&B, COVID)  RVPGX2
Influenza A by PCR: NEGATIVE
Influenza B by PCR: NEGATIVE
Resp Syncytial Virus by PCR: NEGATIVE
SARS Coronavirus 2 by RT PCR: NEGATIVE

## 2023-08-20 LAB — GLUCOSE, CAPILLARY
Glucose-Capillary: 153 mg/dL — ABNORMAL HIGH (ref 70–99)
Glucose-Capillary: 185 mg/dL — ABNORMAL HIGH (ref 70–99)
Glucose-Capillary: 206 mg/dL — ABNORMAL HIGH (ref 70–99)
Glucose-Capillary: 212 mg/dL — ABNORMAL HIGH (ref 70–99)
Glucose-Capillary: 132 mg/dL — ABNORMAL HIGH (ref 70–99)

## 2023-08-20 LAB — ETHANOL: Alcohol, Ethyl (B): <15 mg/dL (ref <15)

## 2023-08-20 LAB — LACTIC ACID, PLASMA
Lactic Acid, Venous: 4.8 mmol/L (ref 0.5–1.9)
Lactic Acid, Venous: 3.3 mmol/L (ref 0.5–1.9)

## 2023-08-20 LAB — PHOSPHORUS: Phosphorus: 2 mg/dL — ABNORMAL LOW (ref 2.5–4.6)

## 2023-08-20 LAB — MAGNESIUM: Magnesium: 1.3 mg/dL — ABNORMAL LOW (ref 1.7–2.4)

## 2023-08-20 LAB — ABO/RH: ABO/RH(D): O POS

## 2023-08-20 MED ORDER — POLYETHYLENE GLYCOL 3350 17 G PO PACK: 17.0000 g | PACK | Freq: Every day | ORAL | Status: DC | PRN

## 2023-08-20 MED ORDER — LACTATED RINGERS IV SOLN: INTRAVENOUS | Status: DC

## 2023-08-20 MED ORDER — VANCOMYCIN HCL IN DEXTROSE 1-5 GM/200ML-% IV SOLN: 1000.0000 mg | Freq: Once | INTRAVENOUS | Status: DC

## 2023-08-20 MED ORDER — POTASSIUM CHLORIDE CRYS ER 20 MEQ PO TBCR
40.0000 meq | EXTENDED_RELEASE_TABLET | Freq: Once | ORAL | Status: DC
  Filled 2023-08-20: qty 2

## 2023-08-20 MED ORDER — POTASSIUM CHLORIDE 10 MEQ/100ML IV SOLN
10.0000 meq | INTRAVENOUS | Status: AC
  Administered 2023-08-20 (×4): 10 meq via INTRAVENOUS
  Filled 2023-08-20 (×4): qty 100

## 2023-08-20 MED ORDER — VASOPRESSIN 20 UNITS/100 ML INFUSION FOR SHOCK
0.0000 [IU]/min | INTRAVENOUS | Status: DC
  Administered 2023-08-20 (×2): 0.03 [IU]/min via INTRAVENOUS
  Filled 2023-08-20 (×2): qty 100

## 2023-08-20 MED ORDER — SODIUM CHLORIDE 0.9% IV SOLUTION: Freq: Once | INTRAVENOUS | Status: DC

## 2023-08-20 MED ORDER — NOREPINEPHRINE 4 MG/250ML-% IV SOLN: 0.0000 ug/min | INTRAVENOUS | Status: DC

## 2023-08-20 MED ORDER — LACTATED RINGERS IV BOLUS
1000.0000 mL | Freq: Once | INTRAVENOUS | Status: AC
  Administered 2023-08-20: 1000 mL via INTRAVENOUS

## 2023-08-20 MED ORDER — SODIUM CHLORIDE 0.9 % IV SOLN
2.0000 g | Freq: Once | INTRAVENOUS | Status: AC
  Administered 2023-08-20: 2 g via INTRAVENOUS
  Filled 2023-08-20: qty 12.5

## 2023-08-20 MED ORDER — PERFLUTREN LIPID MICROSPHERE
1.0000 mL | INTRAVENOUS | Status: AC | PRN
  Administered 2023-08-20: 2 mL via INTRAVENOUS

## 2023-08-20 MED ORDER — SODIUM CHLORIDE 0.9 % IV SOLN: 250.0000 mL | INTRAVENOUS | Status: AC

## 2023-08-20 MED ORDER — AMIODARONE HCL IN DEXTROSE 360-4.14 MG/200ML-% IV SOLN
60.0000 mg/h | INTRAVENOUS | Status: AC
  Administered 2023-08-20: 60 mg/h via INTRAVENOUS
  Filled 2023-08-20 (×2): qty 200

## 2023-08-20 MED ORDER — HEPARIN SODIUM (PORCINE) 5000 UNIT/ML IJ SOLN
5000.0000 [IU] | Freq: Three times a day (TID) | INTRAMUSCULAR | Status: DC
  Administered 2023-08-20 – 2023-08-22 (×6): 5000 [IU] via SUBCUTANEOUS
  Filled 2023-08-20 (×6): qty 1

## 2023-08-20 MED ORDER — POTASSIUM CHLORIDE 20 MEQ PO PACK
20.0000 meq | PACK | Freq: Once | ORAL | Status: AC
  Administered 2023-08-20: 20 meq via ORAL
  Filled 2023-08-20: qty 1

## 2023-08-20 MED ORDER — CHLORHEXIDINE GLUCONATE CLOTH 2 % EX PADS
6.0000 | MEDICATED_PAD | Freq: Every day | CUTANEOUS | Status: DC
  Administered 2023-08-20 – 2023-08-22 (×4): 6 via TOPICAL

## 2023-08-20 MED ORDER — LACTATED RINGERS IV BOLUS
500.0000 mL | Freq: Once | INTRAVENOUS | Status: AC
  Administered 2023-08-20: 500 mL via INTRAVENOUS

## 2023-08-20 MED ORDER — PHENYLEPHRINE 80 MCG/ML (10ML) SYRINGE FOR IV PUSH (FOR BLOOD PRESSURE SUPPORT)
100.0000 ug | PREFILLED_SYRINGE | Freq: Once | INTRAVENOUS | Status: AC
  Administered 2023-08-20: 100 ug via INTRAVENOUS
  Filled 2023-08-20: qty 10

## 2023-08-20 MED ORDER — VANCOMYCIN HCL 1250 MG/250ML IV SOLN: 1250.0000 mg | INTRAVENOUS | Status: DC

## 2023-08-20 MED ORDER — AMIODARONE HCL IN DEXTROSE 360-4.14 MG/200ML-% IV SOLN
30.0000 mg/h | INTRAVENOUS | Status: DC
  Administered 2023-08-20 – 2023-08-21 (×2): 30 mg/h via INTRAVENOUS
  Filled 2023-08-20: qty 200

## 2023-08-20 MED ORDER — DILTIAZEM HCL 25 MG/5ML IV SOLN
5.0000 mg | Freq: Once | INTRAVENOUS | Status: DC
  Filled 2023-08-20: qty 5

## 2023-08-20 MED ORDER — POTASSIUM PHOSPHATES 15 MMOLE/5ML IV SOLN
15.0000 mmol | Freq: Once | INTRAVENOUS | Status: AC
  Administered 2023-08-20: 15 mmol via INTRAVENOUS
  Filled 2023-08-20: qty 5

## 2023-08-20 MED ORDER — DOCUSATE SODIUM 100 MG PO CAPS: 100.0000 mg | ORAL_CAPSULE | Freq: Two times a day (BID) | ORAL | Status: DC | PRN

## 2023-08-20 MED ORDER — HYDROCORTISONE SOD SUC (PF) 100 MG IJ SOLR
100.0000 mg | Freq: Two times a day (BID) | INTRAMUSCULAR | Status: DC
  Administered 2023-08-20 (×2): 100 mg via INTRAVENOUS
  Filled 2023-08-20 (×2): qty 2

## 2023-08-20 MED ORDER — METRONIDAZOLE 500 MG/100ML IV SOLN
500.0000 mg | Freq: Once | INTRAVENOUS | Status: AC
  Administered 2023-08-20: 500 mg via INTRAVENOUS
  Filled 2023-08-20: qty 100

## 2023-08-20 MED ORDER — VANCOMYCIN HCL 1500 MG/300ML IV SOLN
1500.0000 mg | Freq: Once | INTRAVENOUS | Status: AC
  Administered 2023-08-20: 1500 mg via INTRAVENOUS
  Filled 2023-08-20 (×2): qty 300

## 2023-08-20 MED ORDER — INSULIN ASPART 100 UNIT/ML IJ SOLN
2.0000 [IU] | INTRAMUSCULAR | Status: DC
  Administered 2023-08-20: 4 [IU] via SUBCUTANEOUS
  Administered 2023-08-20 (×2): 6 [IU] via SUBCUTANEOUS
  Administered 2023-08-21: 2 [IU] via SUBCUTANEOUS

## 2023-08-20 MED ORDER — SODIUM CHLORIDE 0.9 % IV BOLUS
500.0000 mL | Freq: Once | INTRAVENOUS | Status: AC
  Administered 2023-08-20: 500 mL via INTRAVENOUS

## 2023-08-20 MED ORDER — IOHEXOL 350 MG/ML SOLN
75.0000 mL | Freq: Once | INTRAVENOUS | Status: AC | PRN
  Administered 2023-08-20: 75 mL via INTRAVENOUS

## 2023-08-20 MED ORDER — NOREPINEPHRINE 4 MG/250ML-% IV SOLN
0.0000 ug/min | INTRAVENOUS | Status: DC
  Administered 2023-08-20 (×2): 20 ug/min via INTRAVENOUS
  Administered 2023-08-20: 5 ug/min via INTRAVENOUS
  Administered 2023-08-20: 8 ug/min via INTRAVENOUS
  Administered 2023-08-20: 20 ug/min via INTRAVENOUS
  Filled 2023-08-20 (×5): qty 250

## 2023-08-20 MED ORDER — INSULIN ASPART 100 UNIT/ML IJ SOLN
0.0000 [IU] | INTRAMUSCULAR | Status: DC
  Administered 2023-08-20: 2 [IU] via SUBCUTANEOUS

## 2023-08-20 MED ORDER — PIPERACILLIN-TAZOBACTAM 3.375 G IVPB
3.3750 g | Freq: Three times a day (TID) | INTRAVENOUS | Status: DC
  Administered 2023-08-20 – 2023-08-21 (×2): 3.375 g via INTRAVENOUS
  Filled 2023-08-20 (×2): qty 50

## 2023-08-20 MED ORDER — POTASSIUM CHLORIDE 20 MEQ PO PACK
40.0000 meq | PACK | Freq: Once | ORAL | Status: AC
  Administered 2023-08-20: 40 meq via ORAL
  Filled 2023-08-20: qty 2

## 2023-08-20 NOTE — Progress Notes (Signed)
 Pharmacy Antibiotic Note  Steve Thompson is a 89 y.o. male admitted on 08/20/2023 with sepsis likely related to chronic indwelling catheter (was pulled PTA by pt, unclear whether accidental).  Pharmacy has been consulted for vancomycin  and cefepime  dosing.  Question developing AKI; baseline SCr <1, now 1.23, currently being rehydrated.  Plan: Vancomycin  1500mg  given in ED; consider 1250mg  IV Q24H if SCr stabile. Goal AUC 400-550.  Expected AUC 450-530 using SCr 0.9-1.1. Zosyn  3.375g IV Q8H (4-hour infusion).  Height: 5' 9.5 (176.5 cm) Weight: 77.5 kg (170 lb 12.8 oz) IBW/kg (Calculated) : 71.85  Temp (24hrs), Avg:98 F (36.7 C), Min:98 F (36.7 C), Max:98 F (36.7 C)  Recent Labs  Lab 08/20/23 0218 08/20/23 0229 08/20/23 0231  WBC 3.9*  --   --   CREATININE 1.23 1.10  --   LATICACIDVEN  --   --  5.0*    Estimated Creatinine Clearance: 45.4 mL/min (by C-G formula based on SCr of 1.1 mg/dL).    No Known Allergies   Thank you for allowing pharmacy to be a part of this patient's care.  Marvetta Dauphin, PharmD, BCPS  08/20/2023 5:09 AM

## 2023-08-20 NOTE — Progress Notes (Signed)
  The patient underwent a bladder scan, which showed a retention volume of over 675 mL, raising concern. Given his recent urethral meatus trauma that led to hospitalization, I believe a prompt urology evaluation is warranted. I contacted Ole Bourdon, NP from the urology team, by phone, and he agreed to assess the patient as soon as possible for further management. - F/U Urology recs - F/U on Family discussion regarding GOC   Signature: Drue Grow , MD Internal Medicine Resident, PGY-2 [facility]  Pager: [phone] 1:48 PM, 08/20/2023   Please contact the on call pager after 5 pm and on weekends at [phone].

## 2023-08-20 NOTE — ED Provider Notes (Signed)
 [place] EMERGENCY DEPARTMENT AT [facility] Provider Note   CSN: [id number] Arrival date & time: 08/20/23  0207     Patient presents with: Penile Discharge   Steve Thompson is a 89 y.o. male.   Level 5 caveat for acuity of condition.  Patient with a history of hemorrhoids, hypertension, myasthenia gravis here after pulling out Foley catheter at home.  Unclear whether this was accidental or on purpose.  EMS reports there was a puddle of blood in the bathroom and some bleeding from patient's penis.  He does take Plavix .  He complains of some lower abdominal pain.  Patient's blood pressure started to decline and route to the 80s.  Heart rate 120s to 130s.  Patient with waxing and waning mental status.  He denies any pain but occasionally complains of some lower abdominal pain as well.  Denies chest pain or shortness of breath.  No known trauma.  Discussed with patient's wife.  She is unclear what happened.  She desires patient to be full code at this time.  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 intake and found to have AKI secondary to BPH with urinary retention and ultimately discharged with Foley catheter with urology outpatient follow-up  The history is provided by the patient and the EMS personnel. The history is limited by the condition of the patient.  Penile Discharge       Prior to Admission medications   Medication Sig Start Date End Date Taking? Authorizing Provider  clopidogrel  (PLAVIX ) 75 MG tablet Take 1 tablet (75 mg total) by mouth daily. 07/09/23   Odell Celinda Balo, MD  gabapentin  (NEURONTIN ) 300 MG capsule Take 1 capsule (300 mg total) by mouth in the morning AND 2 capsules (600 mg total) at  bedtime. 07/09/23   Odell Celinda Balo, MD  levothyroxine  (SYNTHROID ) 100 MCG tablet Take 1 tablet (100 mcg total) by mouth daily. 02/28/23   Singh, Prashant K, MD  loratadine  (CLARITIN ) 10 MG tablet Take 10 mg by mouth daily.    [provider]  pyridostigmine  (MESTINON ) 60 MG tablet 0.5 to one tab up to 3 times a day as needed. 07/16/23   Onita Duos, MD  rosuvastatin  (CRESTOR ) 20 MG tablet Take 1 tablet (20 mg total) by mouth daily. 02/28/23   Dennise Lavada POUR, MD  tamsulosin  (FLOMAX ) 0.4 MG CAPS capsule Take 2 capsules (0.8 mg total) by mouth daily. 07/09/23   Odell Celinda Balo, MD    Allergies: Patient has no known allergies.    Review of Systems  Unable to perform ROS: Acuity of condition  Genitourinary:  Positive for penile discharge.    Updated Vital Signs BP (!) 137/92 (BP Location: Left Arm)   Pulse (!) 133   Temp 98 F (36.7 C) (Oral)   Resp (!) 24   Ht 5' 9.5 (1.765 m)   Wt 77.5 kg   SpO2 90%   BMI 24.86 kg/m   Physical Exam Constitutional:      General: He is in acute distress.     Appearance: He is ill-appearing and toxic-appearing.     Comments: Pale appearing, ill-appearing Waxing and waning mental status.  Arouses to loud voice Somnolent at times.  Eyes:     Pupils: Pupils are equal, round, and reactive to light.  Cardiovascular:  Rate and Rhythm: Regular rhythm. Tachycardia present.  Pulmonary:     Effort: No respiratory distress.  Chest:     Chest wall: No tenderness.  Abdominal:     Tenderness: There is abdominal tenderness.     Comments: Suprapubic tenderness  Genitourinary:    Comments: Small amount of hypospadias with blood coming from urethra.  No significant active bleeding. Neurological:     General: No focal deficit present.     Comments: No facial droop, 5/5 strength in bilateral arms and legs.     (all labs ordered are listed, but only abnormal results are displayed) Labs Reviewed  COMPREHENSIVE METABOLIC PANEL WITH GFR  - Abnormal; Notable for the following components:      Result Value   Potassium 3.1 (*)    CO2 20 (*)    Glucose, Bld 209 (*)    Total Protein 6.1 (*)    Albumin 3.2 (*)    Total Bilirubin 1.5 (*)    GFR, Estimated 56 (*)    All other components within normal limits  CBC - Abnormal; Notable for the following components:   WBC 3.9 (*)    Hemoglobin 12.5 (*)    nRBC 0.5 (*)    All other components within normal limits  PROTIME-INR - Abnormal; Notable for the following components:   Prothrombin Time 15.4 (*)    All other components within normal limits  CBC - Abnormal; Notable for the following components:   WBC 19.5 (*)    RBC 4.21 (*)    Hemoglobin 12.9 (*)    HCT 38.5 (*)    All other components within normal limits  BASIC METABOLIC PANEL WITH GFR - Abnormal; Notable for the following components:   Potassium 2.6 (*)    CO2 20 (*)    Glucose, Bld 134 (*)    Calcium  8.3 (*)    GFR, Estimated 59 (*)    All other components within normal limits  MAGNESIUM  - Abnormal; Notable for the following components:   Magnesium  1.3 (*)    All other components within normal limits  PHOSPHORUS - Abnormal; Notable for the following components:   Phosphorus 2.0 (*)    All other components within normal limits  BRAIN NATRIURETIC PEPTIDE - Abnormal; Notable for the following components:   B Natriuretic Peptide 178.2 (*)    All other components within normal limits  LACTIC ACID, PLASMA - Abnormal; Notable for the following components:   Lactic Acid, Venous 4.8 (*)    All other components within normal limits  GLUCOSE, CAPILLARY - Abnormal; Notable for the following components:   Glucose-Capillary 153 (*)    All other components within normal limits  CBG MONITORING, ED - Abnormal; Notable for the following components:   Glucose-Capillary 170 (*)    All other components within normal limits  I-STAT CHEM 8, ED - Abnormal; Notable for the following components:   Potassium 3.1 (*)    Glucose,  Bld 208 (*)    Hemoglobin 12.9 (*)    HCT 38.0 (*)    All other components within normal limits  I-STAT CG4 LACTIC ACID, ED - Abnormal; Notable for the following components:   Lactic Acid, Venous 5.0 (*)    All other components within normal limits  I-STAT VENOUS BLOOD GAS, ED - Abnormal; Notable for the following components:   pCO2, Ven 43.0 (*)    pO2, Ven 123 (*)    Acid-base deficit 3.0 (*)    Potassium 3.0 (*)  HCT 37.0 (*)    Hemoglobin 12.6 (*)    All other components within normal limits  TROPONIN I (HIGH SENSITIVITY) - Abnormal; Notable for the following components:   Troponin I (High Sensitivity) 21 (*)    All other components within normal limits  TROPONIN I (HIGH SENSITIVITY) - Abnormal; Notable for the following components:   Troponin I (High Sensitivity) 277 (*)    All other components within normal limits  MRSA NEXT GEN BY PCR, NASAL  CULTURE, BLOOD (ROUTINE X 2)  CULTURE, BLOOD (ROUTINE X 2)  RESPIRATORY PANEL BY PCR  RESP PANEL BY RT-PCR (RSV, FLU A&B, COVID)  [id number]  ETHANOL  LIPASE, BLOOD  PROCALCITONIN  TSH  URINALYSIS, ROUTINE W REFLEX MICROSCOPIC  HEMOGLOBIN A1C  STREP PNEUMONIAE URINARY ANTIGEN  LACTIC ACID, PLASMA  BASIC METABOLIC PANEL WITH GFR  LEGIONELLA PNEUMOPHILA SEROGP 1 UR AG  BASIC METABOLIC PANEL WITH GFR  I-STAT CHEM 8, ED  I-STAT CG4 LACTIC ACID, ED  POC OCCULT BLOOD, ED  I-STAT ARTERIAL BLOOD GAS, ED  I-STAT CG4 LACTIC ACID, ED  TYPE AND SCREEN  PREPARE RBC (CROSSMATCH)  ABO/RH  TROPONIN I (HIGH SENSITIVITY)    EKG: EKG Interpretation Date/Time:  Monday August 20 2023 02:12:35 EDT Ventricular Rate:  133 PR Interval:  81 QRS Duration:  150 QT Interval:  361 QTC Calculation: 542 R Axis:   -59  Text Interpretation: Sinus or ectopic atrial tachycardia RBBB and LAFB Rate faster atrial flutter? Confirmed by Carita Senior [phone]) on 08/20/2023 4:03:58 AM  Radiology: CT CHEST ABDOMEN PELVIS W CONTRAST Result Date:  08/20/2023 EXAM: CT CHEST, ABDOMEN AND PELVIS WITH CONTRAST 08/20/2023 03:09:45 AM TECHNIQUE: CT of the chest, abdomen and pelvis was performed with the administration of intravenous contrast. Multiplanar reformatted images are provided for review. Automated exposure control, iterative reconstruction, and/or weight based adjustment of the mA/kV was utilized to reduce the radiation dose to as low as reasonably achievable. COMPARISON: CT dated 06/27/2023. CLINICAL HISTORY: Sepsis; Hypotensive, bleeding from penis after Foley catheter removal. Patient BIB EMS from home with complaint of patient pulled out Foley catheter. Patient actively bleeding from penis. Per EMS patient on Eliquis. Wife reports that patient has done this before. FINDINGS: CHEST: MEDIASTINUM: Small to moderate pericardial effusion has increased since 06/27/2023. Coronary artery and aortic atherosclerotic calcifications. THORACIC LYMPH NODES: No mediastinal, hilar or axillary lymphadenopathy. LUNGS AND PLEURA: Bilateral lower lobe scarring or atelectasis redemonstrated. Subsolid nodule in the posterior medial left lower lobe measures 2.0 x 1.9 cm, similar to slightly increased in size since 06/27/2023. Additional subsolid nodule in the right upper lobe anterior to the major fissure measures 1.1 x 1.9 cm on series 7 image 62. ABDOMEN AND PELVIS: LIVER: The liver is unremarkable. GALLBLADDER AND BILE DUCTS: Gallbladder is unremarkable. No biliary ductal dilatation. SPLEEN: No acute abnormality. PANCREAS: No acute abnormality. ADRENAL GLANDS: No acute abnormality. KIDNEYS, URETERS AND BLADDER: Prominent bilateral extrarenal pelvises. Mild bilateral hydroureter, possibly secondary to ascending urinary tract infection. Marked irregular bladder wall thickening and mucosal hyperenhancement. Numerous bladder diverticula. GI AND BOWEL: Extensive colonic diverticulosis. There is some stranding and fluid about the proximal sigmoid colon, favored related to  cystitis; however, uncomplicated diverticulitis could appear similarly. No bowel obstruction. REPRODUCTIVE ORGANS: Marked enlargement of the prostate. PERITONEUM AND RETROPERITONEUM: No free air. VASCULATURE: Aorta is normal in caliber. ABDOMINAL AND PELVIS LYMPH NODES: No lymphadenopathy. BONES AND SOFT TISSUES: Chronic superior endplate compression of T12 and L2. No acute fracture in the chest, abdomen, or pelvis.  IMPRESSION: 1. Extensive bladder wall thickening and mucosal Hyperenhancement with multiple bladder diverticula. This may be related to cystitis. Underlying malignancy not excluded. 2. Mild bilateral hydroureter similar to prior. 3. Marked enlargement of the prostate. 4. Small to moderate pericardial effusion, increased since June 27, 2023. 5. Subsolid nodule in the posterior medial left lower lobe, measuring 2.0 x 1.9 cm, similar to slightly increased in size since June 27, 2023. Additional subsolid nodule in the right upper lobe anterior to the major fissure, measuring 1.1 x 1.9 cm. Findings are concerning for bronchogenic carcinoma. 6. Extensive colonic diverticulosis. Stranding and fluid about the proximal sigmoid colon is favored due to cystitis. Mild diverticulitis could appear similarly. Electronically signed by: Norman Gatlin MD 08/20/2023 03:28 AM EDT RP Workstation: [id number]   CT Head Wo Contrast Result Date: 08/20/2023 EXAM: CT HEAD WITHOUT 08/20/2023 03:10:10 AM TECHNIQUE: CT of the head was performed without the administration of intravenous contrast. Automated exposure control, iterative reconstruction, and/or weight based adjustment of the mA/kV was utilized to reduce the radiation dose to as low as reasonably achievable. COMPARISON: 06/27/2023 CLINICAL HISTORY: Mental status change, unknown cause. FINDINGS: BRAIN AND VENTRICLES: No acute intracranial hemorrhage. No mass effect or midline shift. No extra-axial fluid collection. Gray-white differentiation is maintained. No  hydrocephalus. Mild volume loss and chronic ischemic white matter changes or right thalamic small vessel infarct. ORBITS: No acute abnormality. SINUSES AND MASTOIDS: No acute abnormality. SOFT TISSUES AND SKULL: No acute skull fracture. No acute soft tissue abnormality. IMPRESSION: 1. No acute intracranial abnormality. 2. Mild volume loss and chronic ischemic white matter changes. Electronically signed by: Franky Stanford MD 08/20/2023 03:23 AM EDT RP Workstation: [id number]   DG Pelvis Portable Result Date: 08/20/2023 EXAM: 1 or 2 VIEW(S) XRAY OF THE PELVIS 08/20/2023 02:42:00 AM COMPARISON: None available. CLINICAL HISTORY: Trauma. Penile bleeding discharge. FINDINGS: BONES AND JOINTS: No acute fracture. No focal osseous lesion. No joint dislocation. SOFT TISSUES: The soft tissues are unremarkable. IMPRESSION: 1. No significant abnormality. Electronically signed by: Norman Gatlin MD 08/20/2023 02:59 AM EDT RP Workstation: [id number]   DG Chest Port 1 View Result Date: 08/20/2023 EXAM: 1 VIEW XRAY OF THE CHEST 08/20/2023 02:41:00 AM COMPARISON: 01/27/2023 CLINICAL HISTORY: Trauma. Trauma fall penile discharge FINDINGS: LUNGS AND PLEURA: Left basilar atelectasis or infiltrates. No pleural effusion. No pneumothorax. HEART AND MEDIASTINUM: Stable cardiomediastinal silhouette. Aortic atherosclerotic calcification. BONES AND SOFT TISSUES: No acute osseous abnormality. IMPRESSION: 1. Left basilar atelectasis or infiltrates. Electronically signed by: Norman Gatlin MD 08/20/2023 02:58 AM EDT RP Workstation: [id number]     .Critical Care  Performed by: Carita Senior, MD Authorized by: Carita Senior, MD   Critical care provider statement:    Critical care time (minutes):  60   Critical care time was exclusive of:  Separately billable procedures and treating other patients   Critical care was necessary to treat or prevent imminent or life-threatening deterioration of the following conditions:  Sepsis and  shock   Critical care was time spent personally by me on the following activities:  Development of treatment plan with patient or surrogate, discussions with consultants, evaluation of patient's response to treatment, examination of patient, ordering and review of laboratory studies, ordering and review of radiographic studies, ordering and performing treatments and interventions, pulse oximetry, re-evaluation of patient's condition, review of old charts, blood draw for specimens and obtaining history from patient or surrogate   I assumed direction of critical care for this patient from another provider in my specialty: no  Care discussed with: admitting provider      Medications Ordered in the ED  0.9 %  sodium chloride  infusion (Manually program via Guardrails IV Fluids) (has no administration in time range)  lactated ringers  bolus 1,000 mL (has no administration in time range)  lactated ringers  bolus 1,000 mL (has no administration in time range)  lactated ringers  infusion (has no administration in time range)  ceFEPIme  (MAXIPIME ) 2 g in sodium chloride  0.9 % 100 mL IVPB (has no administration in time range)  metroNIDAZOLE  (FLAGYL ) IVPB 500 mg (has no administration in time range)  vancomycin  (VANCOREADY) IVPB 1500 mg/300 mL (has no administration in time range)                                    Medical Decision Making Amount and/or Complexity of Data Reviewed Independent Historian: EMS Labs: ordered. Decision-making details documented in ED Course. Radiology: ordered and independent interpretation performed. Decision-making details documented in ED Course. ECG/medicine tests: ordered and independent interpretation performed. Decision-making details documented in ED Course.  Risk Prescription drug management. Decision regarding hospitalization.   Patient arrives in extremis with bleeding from his penis.  Apparently pulled out his Foley catheter.  Tachycardic to the 130s with  blood pressure in the 70s.  He is somnolent but arousable to loud voice and appears pale.  IV access established.  Emergency release blood ordered.  Labs to be obtained including cultures.  Patient given IV fluids, IV antibiotics after cultures are obtained.  Cannot rule out sepsis at this time.  Bedside ultrasound does not show any intra-abdominal free fluid but does show an irregularly shaped bladder with thickening.  Does not appear to have active ongoing external bleeding from his penis.  Discussed with Dr. Watt of urology who agrees with CT scan and states may replace Foley catheter if no contraindication based on CT scan. He would not expect hemorrhagic shock from penile source.  Called the patient's wife who states he has a full code.  Discussed he is critically ill.  Do not bleeding from penis to cause significant hypotension from hemorrhagic shock.  Blood pressure is improving to 80s and 90s with IV fluids and antibiotics.  Levophed  ordered.  Lactate is 5.  Chest x-ray is negative for infiltrate.  Pelvis x-ray is negative. IVF, cultures, antibiotics. Undifferentiated shock at this point. Consider hemorrhagic shock but also septic shock.  Patient taken to CT scan which shows no intracranial hemorrhage.  CT chest abdomen pelvis shows thickening of bladder wall with diverticula without evidence of bladder rupture. Also concern for lung nodules concerning for something cardiogenic.  Heart rate remains elevated in the 130s.  This may be atrial flutter.  Blood pressure improving to 101 systolic.  Additional IV fluids and Levophed  ordered.  Maintaining airway. Wife and caregiver at bedside want to continue full code for now.   Hemoglobin has returned at 12. Lactate 5. Given hypotension cannot give rate control meds for atrial flutter at this time.  Levophed  initiated.   Admission discussed with Dr. Layman critical care team.     Final diagnoses:  Shock [facility])  Septic shock [facility])   Hemorrhagic shock [facility])    ED Discharge Orders     None          Carita Senior, MD 08/20/23 [phone]

## 2023-08-20 NOTE — Progress Notes (Signed)
 eLink Physician-Brief Progress Note Patient Name: Steve Thompson DOB: 05/25/1931 MRN: [id number]   Date of Service  08/20/2023  HPI/Events of Note  Potassium 3.6 on labs.  Patient taking p.o.  eICU Interventions  20 mEq of potassium ordered for replacement.  RN notified.     Intervention Category Minor Interventions: Electrolytes abnormality - evaluation and management  Jerilynn Berg 08/20/2023, 9:03 PM

## 2023-08-20 NOTE — ED Notes (Signed)
 Pt receiving blood products.[email or web address]

## 2023-08-20 NOTE — ED Notes (Signed)
 Patient required Emergent blood transfusion for blood loss and sever hypotension, blood cultures not collected prior to start of transfusion.

## 2023-08-20 NOTE — Progress Notes (Signed)
 Pharmacy Electrolyte Replacement  Recent Labs:  Recent Labs    08/20/23 0620  K 2.6*  MG 1.3*  PHOS 2.0*  CREATININE 1.17    Low Critical Values (K </= 2.5, Phos </= 1, Mg </= 1) Present: None  Plan: Give potassium phosphate  15 mmol IV once.  Lesieli Bresee, PharmD

## 2023-08-20 NOTE — Procedures (Signed)
   Urology Procedure Note:  Discussed procedure with family who provided consent.  Following this patient was prepped and draped in the usual sterile fashion.  10 cc of viscous lidocaine lubricant were injected directly into the urethral meatus.  After adequate time for anesthetic effect had been provided, a 77f coud catheter was advanced to the level of the bladder without resistance.  There was immediate return of light tan-colored clear malodorous urine.  Having confirmed placement, the retention balloon was inflated with 10cc of sterile water.  Catheter was placed to gravity drainage without dependent loops.  This concluded the procedure.  Patient will follow-up in approximately 30 days for reevaluation and catheter exchange with [facility].   Ole Bourdon, NP [facility] Pager: [phone]

## 2023-08-20 NOTE — Progress Notes (Signed)
  Echocardiogram 2D Echocardiogram has been performed.  [place] R 08/20/2023, 10:12 AM

## 2023-08-20 NOTE — Progress Notes (Addendum)
 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 on Plavix  who presented via EMS due to concern for bleeding from the urethral meatus secondary to trauma from foley removal   Pertinent  Medical History   Past Medical History:  Diagnosis Date   Arthritis    Hemorrhoids    Hypercalcemia    Hyperthyroidism    Peripheral neuropathy      Significant Hospital Events: Including procedures, antibiotic start and stop dates in addition to other pertinent events   8/4 : Admitted 8/4 : Started on Levophed ,maxed out but remain hypotensive  8/4: Started on Vasso  Interim History / Subjective:  Patient is seen at bedside laying in bed in no acute distress with wife at bedside.  Objective   Blood pressure (!) 78/58, pulse (!) 119, temperature (!) 100.9 F (38.3 C), temperature source Axillary, resp. rate (!) 22, height 5' 9.5 (1.765 m), weight 77.5 kg, SpO2 92%.    FiO2 (%):  [45 %] 45 %   Intake/Output Summary (Last 24 hours) at 08/20/2023 0950 Last data filed at 08/20/2023 0900 Gross per 24 hour  Intake 5862.74 ml  Output --  Net 5862.74 ml   Filed Weights   08/20/23 0215  Weight: 77.5 kg    Examination: General: Chronically ill-appearing male, laying in bed in no acute distress HENT: PERRLA, poor dentition Lungs: Clear to auscultation Cardiovascular: Irregular rate and rhythm no significant Abdomen: Soft nontender Extremities: warm and dry  Neuro: More responsive today, answer questions appropriately, sensations intact GU: Foley removed,Swollen pennis   Resolved Hospital Problem list     Assessment & Plan:  Undifferentiated  Shock  Lactic acidosis - Initially presented with septic shock started on Levophed .  He is maxed out on Levophed  but blood pressure is not holding.  Patient remained hypotensive this morning at 79/49.He is warm and dry.  On nonrebreather mask. - Respiratory panel remains pending - Lactic acid of 4.8 < 5, will continue to trend - He is on 16 levo but remains refractory to shock. Considering other cardiologies like arrhthymias, consider. If refractory, may add vasopressin  or switch to Epi vs Steroids  - Avoiding BB and CCB due to pressor requirements - Discontinue Fleeta, continue Zosyn   - Source is likely Kathlynn roulette is removed  Hypertension Atrial flutter new onset Pericardial effusion-noted on CT scan, small to moderate , TSH unremarkable, - Patient remains in a flutter but asymptomatic - Will continue to trend troponin although I do not think he has ACS - Avoid BB and CCB due to pressors - Give amiodarone   - Will hold antihypertensives at this time - Follow-up on echo read for pericardial effusion  BPH  Urinary Retention Hematuria -Continue to hold Foley until urology sees patient.   Hyperglycemia  - Blood glucose of 153 this morning - Continue SSI - CGM   Hypokalemia -Potassium 2.2 this morning -Replete and repeat BMP  Hx of CVA - CT head unremarkable for any acute intracranial abnormalities - Resume Plavix    Ocular myasthenia gravis - Continue Mestinon  when able    Hyperlipidemia Resume home Crestor  when able    Peripheral neuropathy - On home gabapentin , will hold in the setting of  hypotension and dizziness.   Frequent falls noted on previous admission -Fall precautions in place  GOC In the setting of his multiple co morbidities, family discussions were need to be had regarding goals of care.  Patient is currently on Levophed  but remains hypotensive.  Patient continued to be trauma despite adequate medical management, comfort care discussions will be appropriate  will continue  Best Practice (right click and Reselect all SmartList Selections daily)   Diet/type: NPO DVT prophylaxis: Heparin  [place] GI prophylaxis: N/A Lines: N/A Foley:  N/A Code Status:  DNRDNI Last date of multidisciplinary goals of care discussion [as above ]  Labs   CBC: Recent Labs  Lab 08/20/23 0218 08/20/23 0229 08/20/23 0235 08/20/23 0620  WBC 3.9*  --   --  19.5*  HGB 12.5* 12.9* 12.6* 12.9*  HCT 39.6 38.0* 37.0* 38.5*  MCV 93.6  --   --  91.4  PLT 228  --   --  189    Basic Metabolic Panel: Recent Labs  Lab 08/20/23 0218 08/20/23 0229 08/20/23 0235 08/20/23 0620  NA 139 140 140 141  K 3.1* 3.1* 3.0* 2.6*  CL 104 102  --  108  CO2 20*  --   --  20*  GLUCOSE 209* 208*  --  134*  BUN 14 15  --  16  CREATININE 1.23 1.10  --  1.17  CALCIUM  9.0  --   --  8.3*  MG  --   --   --  1.3*  PHOS  --   --   --  2.0*   GFR: Estimated Creatinine Clearance: 42.7 mL/min (by C-G formula based on SCr of 1.17 mg/dL). Recent Labs  Lab 08/20/23 0218 08/20/23 0231 08/20/23 0620  PROCALCITON  --   --  33.39  WBC 3.9*  --  19.5*  LATICACIDVEN  --  5.0* 4.8*    Liver Function Tests: Recent Labs  Lab 08/20/23 0218  AST 23  ALT 13  ALKPHOS 75  BILITOT 1.5*  PROT 6.1*  ALBUMIN 3.2*   Recent Labs  Lab 08/20/23 0218  LIPASE 22   No results for input(s): AMMONIA in the last 168 hours.  ABG    Component Value Date/Time   HCO3 22.6 08/20/2023 0235   TCO2 24 08/20/2023 0235   ACIDBASEDEF 3.0 (H) 08/20/2023 0235   O2SAT 98 08/20/2023 0235     Coagulation Profile: Recent Labs  Lab 08/20/23 0218  INR 1.2    Cardiac Enzymes: No results for input(s): CKTOTAL, CKMB, CKMBINDEX, TROPONINI in the last 168 hours.  HbA1C: Hgb A1c MFr Bld  Date/Time Value Ref Range Status  02/25/2023 02:20 PM 5.7 (H) 4.8 - 5.6 % Final    Comment:    (NOTE) Pre diabetes:          5.7%-6.4%  Diabetes:              >6.4%  Glycemic control for   <7.0% adults  with diabetes   08/23/2022 10:32 AM 6.1 (H) 4.8 - 5.6 % Final    Comment:             Prediabetes: 5.7 - 6.4          Diabetes: >6.4          Glycemic control for adults with diabetes: <7.0     CBG: Recent Labs  Lab 08/20/23 0217 08/20/23 0731  GLUCAP 170* 153*    Review of Systems:   Denies any chest, pain, fever or  chills.   Past Medical History:  He,  has a past medical history of Arthritis, Hemorrhoids, Hypercalcemia, Hyperthyroidism, and Peripheral neuropathy.   Surgical History:   Past Surgical History:  Procedure Laterality Date   HEMORRHOID SURGERY     PARATHYROIDECTOMY  10/13/10     Social History:   reports that he has quit smoking. He has never used smokeless tobacco. He reports that he does not drink alcohol  and does not use drugs.   Family History:  His family history includes Cancer in his sister.   Allergies No Known Allergies   Home Medications  Prior to Admission medications   Medication Sig Start Date End Date Taking? Authorizing Provider  alfuzosin (UROXATRAL) 10 MG 24 hr tablet Take 10 mg by mouth at bedtime.   Yes [provider]  clopidogrel  (PLAVIX ) 75 MG tablet Take 1 tablet (75 mg total) by mouth daily. 07/09/23  Yes Odell Celinda Balo, MD  finasteride  (PROSCAR ) 5 MG tablet Take 5 mg by mouth daily.   Yes [provider]  gabapentin  (NEURONTIN ) 300 MG capsule Take 1 capsule (300 mg total) by mouth in the morning AND 2 capsules (600 mg total) at bedtime. Patient taking differently: Take 3 capsules (900mg ) by mouth at bedtime 07/09/23  Yes Odell Celinda Balo, MD  levothyroxine  (SYNTHROID ) 100 MCG tablet Take 1 tablet (100 mcg total) by mouth daily. 02/28/23  Yes Dennise Lavada POUR, MD  loratadine  (CLARITIN ) 10 MG tablet Take 10 mg by mouth daily.   Yes [provider]  pyridostigmine  (MESTINON ) 60 MG tablet 0.5 to one tab up to 3 times a day as needed. Patient taking differently: Take 60 mg by mouth every evening.  0.5 to one tab up to 3 times a day as needed. 07/16/23  Yes Onita Duos, MD  rosuvastatin  (CRESTOR ) 20 MG tablet Take 1 tablet (20 mg total) by mouth daily. 02/28/23  Yes Dennise Lavada POUR, MD  tamsulosin  (FLOMAX ) 0.4 MG CAPS capsule Take 2 capsules (0.8 mg total) by mouth daily. Patient not taking: Reported on 08/20/2023 07/09/23   Odell Celinda Balo, MD     Critical care time: 1 hour      Drue Grow MD [facility] - PGY-2 08/20/2023, 9:50 AM Pager# [phone]

## 2023-08-20 NOTE — Progress Notes (Addendum)
 eLink Physician-Brief Progress Note Patient Name: Steve Thompson DOB: 04/09/1931 MRN: [id number]   Date of Service  08/20/2023  HPI/Events of Note  89 year old male with significant past medical history of hypertension, hemorrhoids, hypothyroidism, peripheral neuropathy, multiple falls, ocular myasthenia gravis, arthritis, BPH, CVA 02/2023   In ICU for  Undifferentiated shock: Septic vs hemorrhagic shock from removal of indwelling foley with ongoing hematuria.  Encephalopathy, protecting his airways on 8li nasal o2. A flutter new onset. HR 120 to 130.  Small to moderate pericardial effusion. Recent EF normal.  Urinary re tension, latest bladder scan at 150. Urology consult requested. Is pending. Close scan of bladder  for worsening retension. Bleeding might tamponade off sooner or later.  Or will need intervention.   Hypoglycemia, hyponatremia.   DNR/DNI.   Data: CT of abdomen and pelvis was completed showing extensive bladder wall thickening/mucosal hyperenhancement with bladder diverticula-possibly related to cystitis, moderate enlargement of the prostate Patient's lactic acid 5, WBC 3.9, hemoglobin 12.5> 12.9> 12.6   eICU Interventions  LR bolus and saline bolus ordered. Hg stable. Hematuria should not cause drop in Hg , so no need for another unti of blood at this time. Neo synephrine 100 mcg stat . Low dose Cardizem  push for a flutter rate control, avoiding lopressor due to myasthenia hx.   Ground team Dr Carrol: notified via phone regarding need for central and art line.       Intervention Category Major Interventions: Hypotension - evaluation and management Intermediate Interventions: Bleeding - evaluation and treatment with blood products Evaluation Type: New Patient Evaluation  Steve Thompson 08/20/2023, 5:37 AM  06:23 Camera as follow through: MAP 71, HR 130, family at bed side. Sats ok  Continue care.

## 2023-08-20 NOTE — Progress Notes (Addendum)
 NAME:  Steve Thompson, MRN:  [id number], DOB:  18-Jan-1931, LOS: 0 ADMISSION DATE:  08/20/2023, CONSULTATION DATE:  08/20/23 REFERRING MD:  MD Rancour, CHIEF COMPLAINT:  Hematuria    History of Present Illness:  89 year old male with significant past medical history of hypertension, hemorrhoids, hypothyroidism, peripheral neuropathy, multiple falls, ocular myasthenia gravis, arthritis, BPH, CVA 02/2023 on plavix  for stroke prevention, recent hospitalization from 6/11-6/23/25 for generalized weakness/poor p.o. intake and found to have AKI secondary to BPH with urinary retention and ultimately discharged with Foley catheter with urology outpatient follow-up, who presents to [facility] ED by EMS due to bleeding from ureteral meatus secondary from traumatic Foley catheter removal (unclear of accidental/purpose intent).   Pertinent  Medical History  Further history from daughter. Patient was complaining of burning pain in his lower abdomen and around foley prior to his presentation. He did not have any other complaints and was at baseline. Daughter was worried he fell but when she came and saw him, he was on the ground.  Discussed goals of care with family. Patient does not want any invasive procedures including central lines and intubation. Daughter mentions that he does not have histroy of atrial fibrillation or flutter but she states that sometimes his heart rate can act up  Significant Hospital Events: Including procedures, antibiotic start and stop dates in addition to other pertinent events   Admitted to the ICU  8/4 : Added Vaso to levo, added amiodarone  for better control of heart rate. Critically ill but stable for now   Interim History / Subjective:  Patient is unable to provide any more information   Objective    Blood pressure (!) 79/49, pulse (!) 133, temperature 97.8 F (36.6 C), temperature source Oral, resp. rate 20, height 5' 9.5 (1.765 m), weight 77.5 kg, SpO2 94%.    FiO2 (%):   [45 %] 45 %   Intake/Output Summary (Last 24 hours) at 08/20/2023 0727 Last data filed at 08/20/2023 9287 Gross per 24 hour  Intake 4656.48 ml  Output --  Net 4656.48 ml   Filed Weights   08/20/23 0215  Weight: 77.5 kg    Examination: Physical Exam Constitutional:      Appearance: He is ill-appearing and toxic-appearing.  HENT:     Nose: Nose normal.     Mouth/Throat:     Mouth: Mucous membranes are dry.  Eyes:     Extraocular Movements: Extraocular movements intact.     Pupils: Pupils are equal, round, and reactive to light.  Cardiovascular:     Rate and Rhythm: Tachycardia present. Rhythm irregular.     Pulses: Normal pulses.     Heart sounds: No murmur heard. Pulmonary:     Breath sounds: Rhonchi present.  Abdominal:     General: Abdomen is flat.     Palpations: Abdomen is soft.  Musculoskeletal:        General: No swelling or tenderness.  Skin:    General: Skin is warm.     Capillary Refill: Capillary refill takes 2 to 3 seconds.  Neurological:     Mental Status: He is disoriented.      Resolved problem list   Assessment and Plan  89 Y/O M presenting with shock likely septic shock like due to urinary source. Patient complaining of burning ureteral pain prior to presentation. Found ill at home and presented with shock. He is less likely to have a hemorrhagic shock at this point as he was adequatly  resuscitated and has a  stable hb. His CT Abdomen shows extensive bladder wall thickening/mucosal hyperenhancement with bladder diverticula-possibly related to cystitis. His course is also complicated by atrial flutter with 2:1  GOC Discussed with family at bedside critical illess and goals of care. Patient and family would like to continue current treatment with pressors and antibiotics. No plan for invasive interventions.    Cardiovascular  #Septic Shock  - Continue Zosyn  broadly to cover for infection - Vanco stopped as his MRSA screen was negative  - Follow blood  cultures, can consider antibiotic change once we have an organism  - Stress dose steroids 100mg  Q12h hydrocort   - Stop maintenance fluids  - Patient and family would like to avoid invasive procedures including central lines. Currently getting Vaso and Levo through peripheral lines  - Urology consult, appreciate recs   #New onset Atrial flutter  Holding any AC other than DVT ppx  at this time due to hematuria and concern for hemorrhagic shock  - Started on amiodarone  infusion without bolus with good effect in better heart rate control  - Echocardiogram pending  - Continue cardiac tele - Hold any CCB/ BB  - Hold BP meds   #New onset acute systolic congestive heart failure  #Type II NSTEMI  Likely in the setting of stress cardiomapthy and RVR. Rate better controlled with amiodarone . Continue to trend troponin and EKG  #Pericardial effusion (small) Pending formal echocardiogram   #HLD No intervention   #Chronic Hypertension Currently hypotensive on pressors. Hold any BP meds. Not on any at home anyway    Pulmonary  #Acute hypoxic respiratory failure  #aspiration pneumonitis vs pneumonia  #Pulmonary nodules  Currently on 3L oxygen.  Switch [place] to nasal cannula  CT chest with scattered foci of ground glass opacities some appear on prior scans as well. Subsolid nodule in the LLL measuring 2cm slightly increased in size with some concern for malignancy. A second nodule is seen in the right upper lobe. Given critical illness, nodule management can be deferred at this time after discussion with family    Renal/Urology  #Hematuria with traumatic foley removal #Complicated UTI #Urine retention #BPH Reviewed CT abdomen with evidence of cystitis. Less likely colitis. Bladder scan today with urine retention (650cc) Reached out to urology for further recommendations and for placement of foley catheter  Continue to hold prazosin in the setting of hypotension  Can resume finasteride   at a later time     Heme/Onc #Anemia  #Pulmonary nodules  Continue SCDs Start DVT ppx with heparin    Endo  #Hyperglycemia  #Hypokalemia  #Hypomagnesemia  ICU phase 1  Glycemia protocol  (SSI) Repleting K   Neuro  #Toxic metabolic encephalopathy  #Ocular myasthema  #Hx of CVA  Likely in the setting of sepsis. Supportive care and monitoring  Hold off on Mag repletion      Scheduled Meds:  sodium chloride    Intravenous Once   Chlorhexidine  Gluconate Cloth  6 each Topical Daily   heparin  injection (subcutaneous)  5,000 Units Subcutaneous Q8H   hydrocortisone  sod succinate (SOLU-CORTEF ) inj  100 mg Intravenous Q12H   insulin  aspart  2-6 Units Subcutaneous Q4H   Continuous Infusions:  sodium chloride      sodium chloride      amiodarone  60 mg/hr (08/20/23 1100)   amiodarone      norepinephrine  (LEVOPHED ) Adult infusion 20 mcg/min (08/20/23 1100)   piperacillin -tazobactam (ZOSYN )  IV     potassium PHOSPHATE  IVPB (in mmol) 42 mL/hr at 08/20/23 1100   vasopressin  0.03 Units/min (08/20/23  1100)   PRN Meds:.docusate sodium , polyethylene glycol    Best Practice (right click and Reselect all SmartList Selections daily)   Diet/type: NPO DVT prophylaxis prophylactic heparin   Pressure ulcer(s): N/A GI prophylaxis: N/A Lines: N/A Foley:  N/A Code Status:  DNR/DNI Last date of multidisciplinary goals of care discussion [08/20/23]  Labs   CBC: Recent Labs  Lab 08/20/23 0218 08/20/23 0229 08/20/23 0235 08/20/23 0620  WBC 3.9*  --   --  19.5*  HGB 12.5* 12.9* 12.6* 12.9*  HCT 39.6 38.0* 37.0* 38.5*  MCV 93.6  --   --  91.4  PLT 228  --   --  189    Basic Metabolic Panel: Recent Labs  Lab 08/20/23 0218 08/20/23 0229 08/20/23 0235  NA 139 140 140  K 3.1* 3.1* 3.0*  CL 104 102  --   CO2 20*  --   --   GLUCOSE 209* 208*  --   BUN 14 15  --   CREATININE 1.23 1.10  --   CALCIUM  9.0  --   --    GFR: Estimated Creatinine Clearance: 45.4 mL/min (by C-G formula  based on SCr of 1.1 mg/dL). Recent Labs  Lab 08/20/23 0218 08/20/23 0231 08/20/23 0620  WBC 3.9*  --  19.5*  LATICACIDVEN  --  5.0*  --     Liver Function Tests: Recent Labs  Lab 08/20/23 0218  AST 23  ALT 13  ALKPHOS 75  BILITOT 1.5*  PROT 6.1*  ALBUMIN 3.2*   Recent Labs  Lab 08/20/23 0218  LIPASE 22   No results for input(s): AMMONIA in the last 168 hours.  ABG    Component Value Date/Time   HCO3 22.6 08/20/2023 0235   TCO2 24 08/20/2023 0235   ACIDBASEDEF 3.0 (H) 08/20/2023 0235   O2SAT 98 08/20/2023 0235     Coagulation Profile: Recent Labs  Lab 08/20/23 0218  INR 1.2    Cardiac Enzymes: No results for input(s): CKTOTAL, CKMB, CKMBINDEX, TROPONINI in the last 168 hours.  HbA1C: Hgb A1c MFr Bld  Date/Time Value Ref Range Status  02/25/2023 02:20 PM 5.7 (H) 4.8 - 5.6 % Final    Comment:    (NOTE) Pre diabetes:          5.7%-6.4%  Diabetes:              >6.4%  Glycemic control for   <7.0% adults with diabetes   08/23/2022 10:32 AM 6.1 (H) 4.8 - 5.6 % Final    Comment:             Prediabetes: 5.7 - 6.4          Diabetes: >6.4          Glycemic control for adults with diabetes: <7.0     CBG: Recent Labs  Lab 08/20/23 0217  GLUCAP 170*    Review of Systems:   Patient disoriented and unable to perform a thorough ROS   Past Medical History:  He,  has a past medical history of Arthritis, Hemorrhoids, Hypercalcemia, Hyperthyroidism, and Peripheral neuropathy.   Surgical History:   Past Surgical History:  Procedure Laterality Date   HEMORRHOID SURGERY     PARATHYROIDECTOMY  10/13/10     Social History:   reports that he has quit smoking. He has never used smokeless tobacco. He reports that he does not drink alcohol  and does not use drugs.   Family History:  His family history includes Cancer in his sister.  Allergies No Known Allergies   Home Medications  Prior to Admission medications   Medication Sig Start Date  End Date Taking? Authorizing Provider  alfuzosin (UROXATRAL) 10 MG 24 hr tablet Take 10 mg by mouth at bedtime.   Yes [provider]  clopidogrel  (PLAVIX ) 75 MG tablet Take 1 tablet (75 mg total) by mouth daily. 07/09/23  Yes Odell Celinda Balo, MD  finasteride  (PROSCAR ) 5 MG tablet Take 5 mg by mouth daily.   Yes [provider]  gabapentin  (NEURONTIN ) 300 MG capsule Take 1 capsule (300 mg total) by mouth in the morning AND 2 capsules (600 mg total) at bedtime. Patient taking differently: Take 3 capsules (900mg ) by mouth at bedtime 07/09/23  Yes Odell Celinda Balo, MD  levothyroxine  (SYNTHROID ) 100 MCG tablet Take 1 tablet (100 mcg total) by mouth daily. 02/28/23  Yes Dennise Lavada POUR, MD  loratadine  (CLARITIN ) 10 MG tablet Take 10 mg by mouth daily.   Yes [provider]  pyridostigmine  (MESTINON ) 60 MG tablet 0.5 to one tab up to 3 times a day as needed. Patient taking differently: Take 60 mg by mouth every evening. 0.5 to one tab up to 3 times a day as needed. 07/16/23  Yes Onita Duos, MD  rosuvastatin  (CRESTOR ) 20 MG tablet Take 1 tablet (20 mg total) by mouth daily. 02/28/23  Yes Dennise Lavada POUR, MD  tamsulosin  (FLOMAX ) 0.4 MG CAPS capsule Take 2 capsules (0.8 mg total) by mouth daily. Patient not taking: Reported on 08/20/2023 07/09/23   Odell Celinda Balo, MD     Critical care time: 28    I spent 65 minutes caring for this patient today, including preparing to see the patient, obtaining a medical history , reviewing a separately obtained history, performing a medically appropriate examination and/or evaluation, counseling and educating the patient/family/caregiver, ordering medications, tests, or procedures, referring and communicating with other health care professionals (not separately reported), and documenting clinical information in the electronic health record  The patient is critically ill due to Septic shock requiring vasopressors, cardiovascular  collpase with a life threatening arrhythmia requiring amiodarone  infusion .  Critical care was necessary to treat or prevent imminent or life-threatening deterioration. Critical care time was spent by me on the following activities: development of a treatment plan with the patient and/or surrogate as well as nursing, discussions with consultants, evaluation of the patient's response to treatment, examination of the patient, obtaining a history from the patient or surrogate, ordering and performing treatments and interventions, ordering and review of laboratory studies, ordering and review of radiographic studies, review of telemetry data including pulse oximetry, re-evaluation of patient's condition and participation in multidisciplinary rounds.    Zola LOISE Herter, MD [facility] 08/20/2023 2:12 PM

## 2023-08-20 NOTE — Evaluation (Signed)
 Clinical/Bedside Swallow Evaluation Patient Details  Name: Steve Thompson MRN: [id number] Date of Birth: 10-Nov-1931  Today's Date: 08/20/2023 Time: SLP Start Time (ACUTE ONLY): 1055 SLP Stop Time (ACUTE ONLY): 1115 SLP Time Calculation (min) (ACUTE ONLY): 20 min  Past Medical History:  Past Medical History:  Diagnosis Date   Arthritis    Hemorrhoids    Hypercalcemia    Hyperthyroidism    Peripheral neuropathy    Past Surgical History:  Past Surgical History:  Procedure Laterality Date   HEMORRHOID SURGERY     PARATHYROIDECTOMY  10/13/10   HPI:  Patient is a 89 year old male with significant past medical history of hypertension, hemorrhoids, hypothyroidism, peripheral neuropathy, multiple falls, ocular myasthenia gravis, arthritis, BPH, CVA 02/2023 on plavix  for stroke prevention, recent hospitalization from 6/11-6/23/25 for generalized weakness/poor p.o. intake and found to have AKI secondary to BPH with urinary retention and ultimately discharged with Foley catheter with urology outpatient follow-up, who presents to [facility] by EMS due to bleeding from ureteral meatus secondary from traumatic Foley catheter removal (unclear of accidental/purpose intent).    Assessment / Plan / Recommendation  Clinical Impression   Pt presents with a mild oral dysphagia per clinical swallow assessment today. Oral deficits likely in the setting of oral dryness, which may improve once he starts PO intake and is able to transition off the Venturi mask. Pharyngeal phase clinically observed to be grossly WNL.   Oral prep and transit mildly prolonged with dry solid with min scattered oral residue. Pt requested water to clear oral residue. No anterior loss observed. Pt able to easily clear liquids, applesauce, and soft solid. Pharyngeal swallow initiation appeared prompt with laryngeal elevation noted. No overt or subtle s/s of aspiration observed. Pt denied hx of dysphagia or acute concerns for dysphagia at  this time.   Diet recommendations outlined below. Pt is at a mild risk of aspiration secondary to deconditioning and tenuous respiratory status. SLP will follow up to assess diet tolerance and advance or modify diet as indicated.   SLP Visit Diagnosis: Dysphagia, oral phase (R13.11)    Aspiration Risk  Mild aspiration risk    Diet Recommendation Dysphagia 3 (Mech soft);Thin liquid    Liquid Administration via: Cup;Straw Medication Administration: Whole meds with liquid Supervision: Staff to assist with self feeding Compensations: Slow rate;Small sips/bites;Follow solids with liquid Postural Changes: Seated upright at 90 degrees    Other  Recommendations Oral Care Recommendations: Oral care BID     Assistance Recommended at Discharge    Functional Status Assessment Patient has had a recent decline in their functional status and demonstrates the ability to make significant improvements in function in a reasonable and predictable amount of time.  Frequency and Duration min 1 x/week  1 week       Prognosis Prognosis for improved oropharyngeal function: Good      Swallow Study   General Date of Onset: 08/20/23 HPI: Patient is a 89 year old male with significant past medical history of hypertension, hemorrhoids, hypothyroidism, peripheral neuropathy, multiple falls, ocular myasthenia gravis, arthritis, BPH, CVA 02/2023 on plavix  for stroke prevention, recent hospitalization from 6/11-6/23/25 for generalized weakness/poor p.o. intake and found to have AKI secondary to BPH with urinary retention and ultimately discharged with Foley catheter with urology outpatient follow-up, who presents to [facility] ED by EMS due to bleeding from ureteral meatus secondary from traumatic Foley catheter removal (unclear of accidental/purpose intent). Type of Study: Bedside Swallow Evaluation Previous Swallow Assessment: none per chart Diet Prior  to this Study: NPO Temperature Spikes Noted: No Respiratory  Status: Venti-mask History of Recent Intubation: No Behavior/Cognition: Alert;Cooperative;Pleasant mood Oral Cavity Assessment: Dry Oral Cavity - Dentition: Adequate natural dentition Vision: Functional for self-feeding Self-Feeding Abilities: Needs set up Patient Positioning: Upright in bed Baseline Vocal Quality: Low vocal intensity Volitional Swallow: Able to elicit    Oral/Motor/Sensory Function Overall Oral Motor/Sensory Function: Within functional limits   Ice Chips Ice chips: Within functional limits   Thin Liquid Thin Liquid: Within functional limits Presentation: Cup;Straw;Self Fed    Nectar Thick Nectar Thick Liquid: Not tested   Honey Thick Honey Thick Liquid: Not tested   Puree Puree: Within functional limits   Solid     Solid: Impaired Oral Phase Functional Implications: Prolonged oral transit;Oral residue      Steve Thompson 08/20/2023,1:28 PM

## 2023-08-20 NOTE — Consult Note (Signed)
 Consultation Note Date: 08/20/2023   Patient Name: Steve Thompson  DOB: 11-Sep-1931  MRN: [id number]  Age / Sex: 89 y.o., male  PCP: Okey Carlin Redbird, MD Referring Physician: Zaida Zola SAILOR, MD  Reason for Consultation: Establishing goals of care  HPI/Patient Profile: 89 y.o. male  with past medical history of  hypertension, hemorrhoids, hypothyroidism, peripheral neuropathy, multiple falls, ocular myasthenia gravis, arthritis, BPH, CVA 02/2023 on plavix  for stroke prevention, recent hospitalization from 6/11-6/23/25 for generalized weakness/poor p.o. intake and found to have AKI secondary to BPH with urinary retention  admitted on 08/20/2023 with bleeding fro ureteral meatus secondary from traumatic foley catheter removal of unclear cause. Currently being treated for septic shock, likely urinary in etiology.   Of note, he was seen by PMT back in June for goals of care discussion.  At that time, patient and family had decided to continue with supportive care, and has changed his code status from full code to DNR/DNI.    PMT has been consulted to assist with goals of care conversation. Patient/Family face treatment option decisions, advanced directive decisions and anticipatory care needs.    Clinical Assessment and Goals of Care:  We have reviewed medical records including EPIC notes, labs and imaging, assessed the patient and then met with patient, Steve Thompson (spouse), and Steve Thompson (caregiver) to discuss diagnosis prognosis, GOC, EOL wishes, disposition and options. Their church pastor also was present.   We introduced Palliative Medicine as specialized medical care for people living with serious illness. It focuses on providing relief from the symptoms and stress of a serious illness. The goal is to improve quality of life for both the patient and the family.  Created space and opportunity for family to explore thoughts and feelings regarding patient's current  medical condition.   We visited patient at bedside, he is ill-appearing however not in significant form of acute distress. He is alert and oriented and is able to converse with staff and make his needs known. During our visit, patient currently on vasoactive and rate controlling medications to support blood pressure and manage hemodynamics.    Medical History Review and Family/Patient Understanding:  The patient was admitted today with bleeding from the urethral meatus, suspected to be secondary to trauma following Foley catheter removal. He has been diagnosed with septic shock, most likely of urinary origin. He remains hypotensive and is minimally responsive to maximal doses of Levophed .  The patient's wife demonstrates a clear understanding of his current critical condition and recognizes the significant risk of further deterioration due to undifferentiated shock, advanced age, and multiple comorbidities. She expressed that both she and the patient would not wish to pursue any aggressive or invasive interventions and would opt for comfort measures should other intervention fails.    Social History: Patient lives with wife Steve Thompson of 15 years. He has a son and a daughter that lives out of state. They have very good support from their church family.   Functional and Nutritional State: Wife reports noticeable physical and cognitive decline overtime although she report patient being ambulatory up to yesterday.   Palliative Symptoms: Generalized weakness, dyspnea,   Advance Directives: Patient has the DNR/DNI and MOST form on file.    Code Status: Patient is DNR limited. Based on patient/family discussion, no further aggressive/invasive interventions including establishing central venous access.   Discussion:  Today, we met with the patient's wife, Steve Thompson, caregiver Steve Thompson, and their church pastor, Steve Thompson, in the conference room to discuss the patient's current  medical condition. We  explained that the patient remains critically ill with septic shock and is unable to maintain adequate blood pressure despite being on maximum doses of vasoactive medications.  We reviewed and confirmed the patient's code status as DNR/DNI, and clarified that the family does not wish to pursue any aggressive or invasive interventions, including the placement of central lines, given the patient's significant disease burden and the likelihood of poor outcomes regardless of further intervention.  We discussed anticipatory guidance regarding the expected clinical course and addressed grief and support needs, acknowledging the high probability of further decompensation in the context of limited medical interventions. It was confirmed that the current plan of care is consistent with the patient's wishes and values. The patient's son is expected to arrive from out of state later this afternoon.  Update: I spoke with the patient's daughter, Steve Thompson, at approximately 13:45 to update her on her father's current medical condition. She demonstrated a clear understanding of the patient's critical illness, the high risk for further decompensation, and the importance of timely end-of-life discussions. She shared that her brother is expected to arrive this afternoon, and that she herself will be arriving later today or early tomorrow morning. We briefly reviewed what to expect moving forward, including the possibility of the patient passing in the hospital, as well as options for hospice care at home versus inpatient hospice, as appropriate. A family meeting is scheduled for tomorrow, 06/21/2023, at 10:00 to further discuss the treatment plan and goals of care. Wife Steve Thompson was updated about the scheduled meeting for tomorrow.   The difference between aggressive medical intervention and comfort care was considered in light of the patient's goals of care. Hospice and Palliative Care services outpatient were explained and  offered.   Discussed the importance of continued conversation with family and the medical providers regarding overall plan of care and treatment options, ensuring decisions are within the context of the patient's values and GOCs.   Questions and concerns were addressed. The family was encouraged to call with questions or concerns.  PMT will continue to support holistically.   SUMMARY OF RECOMMENDATIONS    Code Status: DNR-Limited Continue with supportive care including medications for now however no more invasive/aggressive procedures/interventions. A family meeting is scheduled for tomorrow, 08/21/2023 at 10:00 am to further discuss goals of care, including end-of-life considerations. Symptomatic management (Per ICU team) Consult Chaplain services for spiritual support Psychosocial and emotional support provided PMT continue to provide holistic support   Palliative Prophylaxis:  Aspiration, Delirium Protocol, Frequent Pain Assessment, Oral Care, and Turn Reposition   Prognosis:  Prognosis is poor given the patient's current clinical status, including refractory septic shock with persistent hypotension despite maximal vasopressor support, advanced age, and multiple comorbidities.  Discharge Planning:      Primary Diagnoses: Present on Admission:  Sepsis [facility])    Physical Exam Vitals and nursing note reviewed.  Constitutional:      Appearance: He is ill-appearing.  HENT:     Head: Normocephalic and atraumatic.  Cardiovascular:     Rate and Rhythm: Tachycardia present.  Pulmonary:     Comments: On venturi mask at 45% FiO2.  Genitourinary:    Comments: Noted for hematuria, external male urinary pouch in place.   Musculoskeletal:     Comments: Generalized weakness  Skin:    General: Skin is warm and dry.  Neurological:     Mental Status: He is alert and oriented to person, place, and time.  Psychiatric:  Mood and Affect: Mood normal.     Vital Signs: BP (!)  75/56   Pulse (!) 127   Temp (!) 100.9 F (38.3 C) (Axillary)   Resp (!) 23   Ht 5' 9.5 (1.765 m)   Wt 77.5 kg   SpO2 92%   BMI 24.86 kg/m  Pain Scale: 0-10   Pain Score: 0-No pain   SpO2: SpO2: 92 % O2 Device:SpO2: 92 % O2 Flow Rate: .    Palliative Assessment/Data:30%    Total time: I spent 75 minutes in the care of the patient today in the above activities and documenting the encounter.   Kathlyne JULIANNA Tracie Mickey, NP  Palliative Medicine Team Team phone # [phone]  Thank you for allowing the Palliative Medicine Team to assist in the care of this patient. Please utilize secure chat with additional questions, if there is no response within 30 minutes please call the above phone number.  Palliative Medicine Team providers are available by phone from 7am to 7pm daily and can be reached through the team cell phone.  Should this patient require assistance outside of these hours, please call the patient's attending physician.

## 2023-08-20 NOTE — Progress Notes (Signed)
 Pt being followed by [facility] for Sepsis protocol.

## 2023-08-20 NOTE — Progress Notes (Signed)
 Interim CCM Progress Note  Discussed with patient and wife at beside, with family caregiver present. Patient not wanting invasive procedures: CVC or arterial line. Discussed that we are pretty much at max interventions in regards to vasopressors. Levophed  is a 14 mcg, receiving an additional IV fluid bolus.  P:  Will place peripheral levophed  limit at Patient and family understand that patient is critically ill and has high chance of not surviving this hospitalization  If patient continues to worsen, recommend transitioning to comfort.  Currently patient is not in any pain or discomfort  Sherlean Sharps AGACNP-BC   [facility] 08/20/2023, 6:55 AM  Please see [email or web address] for pager details.  From 7A-7P if no response, please call [phone]. After hours, please call [facility] [phone].

## 2023-08-20 NOTE — H&P (Addendum)
 NAME:  Steve Thompson, MRN:  [id number], DOB:  10/20/1931, LOS: 0 ADMISSION DATE:  08/20/2023, CONSULTATION DATE:  08/20/23 REFERRING MD:  MD Rancour  CHIEF COMPLAINT:  Penial Discharge/Bleeding   History of Present Illness:  Patient is a 89 year old male with significant past medical history of hypertension, hemorrhoids, hypothyroidism, peripheral neuropathy, multiple falls, ocular myasthenia gravis, arthritis, BPH, CVA 02/2023 on plavix  for stroke prevention, recent hospitalization from 6/11-6/23/25 for generalized weakness/poor p.o. intake and found to have AKI secondary to BPH with urinary retention and ultimately discharged with Foley catheter with urology outpatient follow-up, who presents to [facility] ED by EMS due to bleeding from ureteral meatus secondary from traumatic Foley catheter removal (unclear of accidental/purpose intent).   Pertinent  Medical History  Ocular myasthenia gravis BPH Peripheral neuropathy Hypertension Hypothyroidism Arthritis Multiple falls CVA- note  Significant Hospital Events: Including procedures, antibiotic start and stop dates in addition to other pertinent events   8/4 PCCM admit due to suspecting sepsis versus hemorrhagic shock presentation secondary to removal of Foley catheter.  Patient started on Levophed  given 2 units of PRBCs   Interim History / Subjective:  Patient drowsy, follows commands, oriented x 4  Not in any acute pain  Penial swelling and slight bleeding from uretal meatus from traumatic foley cath removal   Objective    Blood pressure 114/61, pulse (!) 137, temperature 98 F (36.7 C), temperature source Oral, resp. rate (!) 22, height 5' 9.5 (1.765 m), weight 77.5 kg, SpO2 91%.       No intake or output data in the 24 hours ending 08/20/23 0344 Filed Weights   08/20/23 0215  Weight: 77.5 kg    Examination: General: Acute on chronic elderly male, lying on ED stretcher in NAD HENT: Normocephalic, PERRLA intact, pink oral  mucosa, missing teeth/poor dentition Lungs: Clear, diminished, on 2 L nasal cannula satting 95-96% Cardiovascular: S1/S2 irregular-atrial flutter rate 120s 130s, Abdomen: Bowel sounds active, soft Extremities: Moves all extremities to command, no edema Neuro: Drowsy, oriented x 4, follows commands GU: Hypospadias noted-bleeding from ureteral meatus, penile swelling, bleeding minimal at this time  Resolved problem list   Assessment and Plan  Undifferentiated Shock  Suspecting Septic versus hemorrhagic or combination of both due to traumatic removal of foley catheter.  Suspect source of sepsis more than likely urinary due to patient having indwelling catheter chronically.  Chest x-ray did show small left infiltrate-possible development of pneumonia?  Patient on 2 L of oxygen, not on oxygen at baseline. Bleeding noted in patient's diaper as well as coming from ureteral meatus of penis, penile swelling with bleeding discharge noted on exam, bleeding appears to have slowed down CT of abdomen and pelvis was completed showing extensive bladder wall thickening/mucosal hyperenhancement with bladder diverticula-possibly related to cystitis, moderate enlargement of the prostate Patient's lactic acid 5, WBC 3.9, hemoglobin 12.5> 12.9> 12.6, neutrophils on differential elevated Received 3 L of fluid boluses, 2 units of PRBCs, and initiation of Levophed  P: Admit to ICU for further resuscitation and blood pressure management Initiate peripheral levophed , MAP goal greater than 65 Continue LR at 125 mL an hour Continue serial H/H every 4 hours, monitor for signs of bleeding from ureteral meatus Check fecal occult Continue to follow blood cultures Obtain urinalysis when urology has assessed and approved for reinsertion of Foley catheter Antibiotics given in ED-vanc, cefepime , Flagyl - continue vanc, switch cefepime /flagyl  to zosyn  until appropriate source identified -Suspect urinary source with patient having  chronic indwelling catheter in place  Urology consulted, appreciate assistance Hold anticoagulation in setting of bleeding, utilize SCDs for DVT prophylaxis Obtain stat echo-patient noted to have pericardial effusion on CT abdomen pelvis Continue to monitor fever curve along with WBC trend daily per CBC  Obtain strep, Legionella per urine unable to get sample Obtain RVP panel, along with COVID PCR  Hypertension Atrial flutter new onset Pericardial effusion-noted on CT scan, small to moderate  Heart rate noted to be in 120s to 130s, atrial flutter with 2:1 AV block Suspect new onset secondary to shock state from above, previous EKG 06/27/2023 showing first-degree AV block, sinus rhythm Echocardiogram obtained on 6/12 showing EF 65 to 70%, LV normal function, no regional wall abnormalities, moderate asymmetric left ventricular hypertrophy of basal septal segment, RV function normal, no evidence of pericardial effusion 8/4 troponin 21  P: Continue cardiac telemetry continuously May need metoprolol 5mg  IV if rhythm/rate does not improve with fluid resuscitation Hold all anticoagulation at this time due to bleeding see above Obtain TSH Obtain BNP Continue to trend troponin  Hold anti-hypertensive in setting of hypotension Obtain echocardiogram stat-to evaluate pericardial effusion  BPH  Urinary Retention Discharged with Foley on previous admission due to urinary retention Was supposed to follow-up with urology outpatient for possible voiding trial?  P: Urology consult  - Will need to replace Foley catheter once urology has approved, may need urology's assistance with placement of Foley Was supposed to be taking Flomax -Per documentation in EMR patient not taking Restart finasteride  if able to pass swallow evaluation  Hyperglycemia  Glucose noted to be 208 on BMET P:  CBG q 4hrs  SSI  Obtain Hemoglobin A1c   Hypokalemia P:  Replace potassium, will order 10 meq x 4 IV  Repeat BMET  in afternoon   CVA hx 02/25/23 acute right nonhemorrhagic thalamocapsular ischemic stroke, On plavix  for stroke prevention  P: Hold plavix  at this time   Hypothyroidism P: Obtain TSH Continue Synthroid  if able to pass swallow examination  Ocular myasthenia gravis P:  Continue Mestinon  if able to pass swallow evaluation  Hyperlipidemia P: Continue Crestor   Peripheral neuropathy P: Hold gabapentin  at this time-patient initially having some drowsiness/lethargic with hypotension.  If patient is hemodynamically stable may restart  Frequent falls noted on previous admission CT of head showed no intracranial abnormalities P: Placed on fall precautions As well as delirium precautions  Lung Nodule   nodule in the posterior medial left lower lobe measures 2.0 x 1.9 cm, similar to slight increased in size since 06/27/2023 per radiology report  Appears was trying to make appoint with [facility] outpatient  P: Follow up outpatient   Arthritis P: Supportive care  GOC Patient in agreement along with wife with DNR/DNI If patient decompensates, recommendations will be focusing on comfort care P: Will obtain palliative consult- patient has had multiple admissions this past year  Need to further establish GOC   Best Practice (right click and Reselect all SmartList Selections daily)   Diet/type: NPO DVT prophylaxis SCD Pressure ulcer(s): N/A GI prophylaxis: PPI Lines: N/A Foley:  N/A Code Status:  DNR/DNI Last date of multidisciplinary goals of care discussion [discussed with patient and wife at beside with caregiver. In the event of cardiac arrest, patient would not want chest compressions, defibrillation or placed on mechanical ventilation. Patient would not want to be placed on mechanical ventilation during respiratory arrest as well. Patient okay with vasopressors at this time and current resuscitation efforts. If patient declines despite these interventions would  recommend comfort  care.   Labs   CBC: Recent Labs  Lab 08/20/23 0218 08/20/23 0229 08/20/23 0235  WBC 3.9*  --   --   HGB 12.5* 12.9* 12.6*  HCT 39.6 38.0* 37.0*  MCV 93.6  --   --   PLT 228  --   --     Basic Metabolic Panel: Recent Labs  Lab 08/20/23 0218 08/20/23 0229 08/20/23 0235  NA 139 140 140  K 3.1* 3.1* 3.0*  CL 104 102  --   CO2 20*  --   --   GLUCOSE 209* 208*  --   BUN 14 15  --   CREATININE 1.23 1.10  --   CALCIUM  9.0  --   --    GFR: Estimated Creatinine Clearance: 45.4 mL/min (by C-G formula based on SCr of 1.1 mg/dL). Recent Labs  Lab 08/20/23 0218 08/20/23 0231  WBC 3.9*  --   LATICACIDVEN  --  5.0*    Liver Function Tests: Recent Labs  Lab 08/20/23 0218  AST 23  ALT 13  ALKPHOS 75  BILITOT 1.5*  PROT 6.1*  ALBUMIN 3.2*   Recent Labs  Lab 08/20/23 0218  LIPASE 22   No results for input(s): AMMONIA in the last 168 hours.  ABG    Component Value Date/Time   HCO3 22.6 08/20/2023 0235   TCO2 24 08/20/2023 0235   ACIDBASEDEF 3.0 (H) 08/20/2023 0235   O2SAT 98 08/20/2023 0235     Coagulation Profile: Recent Labs  Lab 08/20/23 0218  INR 1.2    Cardiac Enzymes: No results for input(s): CKTOTAL, CKMB, CKMBINDEX, TROPONINI in the last 168 hours.  HbA1C: Hgb A1c MFr Bld  Date/Time Value Ref Range Status  02/25/2023 02:20 PM 5.7 (H) 4.8 - 5.6 % Final    Comment:    (NOTE) Pre diabetes:          5.7%-6.4%  Diabetes:              >6.4%  Glycemic control for   <7.0% adults with diabetes   08/23/2022 10:32 AM 6.1 (H) 4.8 - 5.6 % Final    Comment:             Prediabetes: 5.7 - 6.4          Diabetes: >6.4          Glycemic control for adults with diabetes: <7.0     CBG: Recent Labs  Lab 08/20/23 0217  GLUCAP 170*    Review of Systems:   See HPI   Past Medical History:  He,  has a past medical history of Arthritis, Hemorrhoids, Hypercalcemia, Hyperthyroidism, and Peripheral neuropathy.   Surgical  History:   Past Surgical History:  Procedure Laterality Date   HEMORRHOID SURGERY     PARATHYROIDECTOMY  10/13/10     Social History:   reports that he has quit smoking. He has never used smokeless tobacco. He reports that he does not drink alcohol  and does not use drugs.   Family History:  His family history includes Cancer in his sister.   Allergies No Known Allergies   Home Medications  Prior to Admission medications   Medication Sig Start Date End Date Taking? Authorizing Provider  clopidogrel  (PLAVIX ) 75 MG tablet Take 1 tablet (75 mg total) by mouth daily. 07/09/23   Odell Celinda Balo, MD  gabapentin  (NEURONTIN ) 300 MG capsule Take 1 capsule (300 mg total) by mouth in the morning AND 2 capsules (600 mg total) at bedtime. 07/09/23  Odell Celinda Balo, MD  levothyroxine  (SYNTHROID ) 100 MCG tablet Take 1 tablet (100 mcg total) by mouth daily. 02/28/23   Singh, Prashant K, MD  loratadine  (CLARITIN ) 10 MG tablet Take 10 mg by mouth daily.    [provider]  pyridostigmine  (MESTINON ) 60 MG tablet 0.5 to one tab up to 3 times a day as needed. 07/16/23   Onita Duos, MD  rosuvastatin  (CRESTOR ) 20 MG tablet Take 1 tablet (20 mg total) by mouth daily. 02/28/23   Dennise Lavada POUR, MD  tamsulosin  (FLOMAX ) 0.4 MG CAPS capsule Take 2 capsules (0.8 mg total) by mouth daily. 07/09/23   Odell Celinda Balo, MD     Critical care time: 55 mins    Christian Evalena Fujii AGACNP-BC   [facility] 08/20/2023, 3:45 AM  Please see [email or web address] for pager details.  From 7A-7P if no response, please call [phone]. After hours, please call ELink [phone].

## 2023-08-20 NOTE — ED Triage Notes (Signed)
 Patient BIB EMS from home with complaint of patient pulled out foley cath.   Patient actively bleeding from penis.   Per EMS patient on Eliquis  Wife reports that patient has done this before.

## 2023-08-20 NOTE — Consult Note (Signed)
 Urology Consult Note   Requesting Attending Physician:  Zaida Zola SAILOR, MD Service Providing Consult: Urology  Consulting Attending: Dr. Watt   Reason for Consult:  traumatic foley removal, urinary retention  HPI: Steve Thompson is seen in consultation for reasons noted above at the request of Hattar, Zola SAILOR, MD. patient is a 89 year old male known to our practice.  He presents to [facility] emergency department on 08/20/2023 via EMS due to bleeding from the urethral meatus 2/2 traumatic Foley catheter removal.  He has been seen most recently in our office on 7/17 and is followed by Dr. Lovie for BPH/urinary retention.  [facility] was consulted to assess and replace Foley catheter after postvoid residual was found to be 675 mL.  On my arrival patient was awake but confused and pleasant.  He was accompanied by his wife who provided a partial history.  ------------------  Assessment:   89 y.o. male with traumatic foley removal    Recommendations: # Urethral trauma # Gross hematuria-resolved  Some erosion of the ventral aspect of the urethral meatus noted but no visible trauma. 38f coud catheter was placed without difficulty.  Patient fell asleep during procedure.  No blood noted.  If patient becomes confused and traumatically remove his Foley again, replace with the same catheter immediately.  Follow-up with [facility] in 30 days for catheter exchange and reevaluation.  Urology will not need to follow.  Please call with questions.  Case and plan discussed with Dr. Watt and Dr. Amoako  Past Medical History: Past Medical History:  Diagnosis Date   Arthritis    Hemorrhoids    Hypercalcemia    Hyperthyroidism    Peripheral neuropathy     Past Surgical History:  Past Surgical History:  Procedure Laterality Date   HEMORRHOID SURGERY     PARATHYROIDECTOMY  10/13/10    Medication: Current Facility-Administered Medications  Medication Dose Route Frequency  Provider Last Rate Last Admin   0.9 %  sodium chloride  infusion (Manually program via Guardrails IV Fluids)   Intravenous Once Rancour, Stephen, MD       0.9 %  sodium chloride  infusion  250 mL Intravenous Continuous Rancour, Stephen, MD       0.9 %  sodium chloride  infusion  250 mL Intravenous Continuous Claudene Fonda BROCKS, NP       amiodarone  (NEXTERONE  PREMIX) 360-4.14 MG/200ML-% (1.8 mg/mL) IV infusion  30 mg/hr Intravenous Continuous Hattar, Zola SAILOR, MD 16.8 mL/hr at 08/20/23 1500 30.24 mg/hr at 08/20/23 1500   Chlorhexidine  Gluconate Cloth 2 % PADS 6 each  6 each Topical Daily Smith, Joshua C, NP   6 each at 08/20/23 0547   docusate sodium  (COLACE) capsule 100 mg  100 mg Oral BID PRN Smith, Joshua C, NP       heparin  injection 5,000 Units  5,000 Units Subcutaneous Q8H Hattar, Zola SAILOR, MD   5,000 Units at 08/20/23 1322   hydrocortisone  sodium succinate  (SOLU-CORTEF ) 100 MG injection 100 mg  100 mg Intravenous Q12H Hattar, Zola SAILOR, MD   100 mg at 08/20/23 0858   insulin  aspart (novoLOG ) injection 2-6 Units  2-6 Units Subcutaneous Q4H Hattar, Zola SAILOR, MD   4 Units at 08/20/23 1140   norepinephrine  (LEVOPHED ) 4mg  in (0.016 mg/mL) premix infusion  0-20 mcg/min Intravenous Titrated Claudene Fonda BROCKS, NP 75 mL/hr at 08/20/23 1500 20 mcg/min at 08/20/23 1500   piperacillin -tazobactam (ZOSYN ) IVPB 3.375 g  3.375 g Intravenous Q8H Mattie Marvetta SQUIBB, RPH  polyethylene glycol (MIRALAX  / GLYCOLAX ) packet 17 g  17 g Oral Daily PRN Smith, Joshua C, NP       potassium PHOSPHATE  15 mmol in dextrose  5 % 250 mL infusion  15 mmol Intravenous Once Denninger, Jade M, RPH 42 mL/hr at 08/20/23 1500 Infusion Verify at 08/20/23 1500   vasopressin  (PITRESSIN) 20 Units in 100 mL (0.2 unit/mL) infusion-*FOR SHOCK*  0-0.03 Units/min Intravenous Continuous Hattar, Zola SAILOR, MD 9 mL/hr at 08/20/23 1500 0.03 Units/min at 08/20/23 1500    Allergies: No Known Allergies  Social History: Social History   Tobacco Use    Smoking status: Former   Smokeless tobacco: Never  Substance Use Topics   Alcohol  use: No   Drug use: No    Family History Family History  Problem Relation Age of Onset   Cancer Sister        lung    Review of Systems  Genitourinary:  Negative for dysuria, flank pain, frequency, hematuria and urgency.     Objective   Vital signs in last 24 hours: BP 104/60   Pulse 90   Temp 98.7 F (37.1 C) (Axillary)   Resp 18   Ht 5' 9.5 (1.765 m)   Wt 77.5 kg   SpO2 (!) 87%   BMI 24.86 kg/m   Physical Exam General: Confused HEENT: [place]/AT Pulmonary: Normal work of breathing Cardiovascular: no cyanosis Abdomen: Soft, NTTP, nondistended GU: 47f coud catheter in place draining clear yellow urine. Neuro: UTA  Most Recent Labs: Lab Results  Component Value Date   WBC 19.5 (H) 08/20/2023   HGB 12.9 (L) 08/20/2023   HCT 38.5 (L) 08/20/2023   PLT 189 08/20/2023    Lab Results  Component Value Date   NA 139 08/20/2023   K 3.2 (L) 08/20/2023   CL 107 08/20/2023   CO2 19 (L) 08/20/2023   BUN 18 08/20/2023   CREATININE 1.26 (H) 08/20/2023   CALCIUM  8.0 (L) 08/20/2023   MG 1.3 (L) 08/20/2023   PHOS 2.0 (L) 08/20/2023    Lab Results  Component Value Date   INR 1.2 08/20/2023   APTT 27 02/25/2023     Urine Culture: @[id number] (laburin,org,[id number])@   IMAGING: ECHOCARDIOGRAM COMPLETE Result Date: 08/20/2023    ECHOCARDIOGRAM REPORT   Patient Name:   Steve Thompson Date of Exam: 08/20/2023 Medical Rec #:  [id number]      Height:       69.5 in Accession #:    [id number]     Weight:       170.8 lb Date of Birth:  10-23-1931      BSA:          1.942 m Patient Age:    89 years       BP:           75/56 mmHg Patient Gender: M              HR:           119 bpm. Exam Location:  Inpatient Procedure: 2D Echo, Cardiac Doppler, Color Doppler and Intracardiac            Opacification Agent (Both Spectral and Color Flow Doppler were            utilized during procedure). Indications:     R94.31 Abnormal EKG; I48.92* Unspecified atrial flutter  History:        Patient has prior history of Echocardiogram examinations, most  recent 06/28/2023. TIA and Stroke, Arrythmias:Atrial Flutter,                 Signs/Symptoms:Bacteremia and Altered Mental Status; Risk                 Factors:Dyslipidemia.  Sonographer:    Ellouise Mose RDCS Referring Phys: [id number] FONDA JAYSON SHARPS  Sonographer Comments: Technically difficult study due to poor echo windows. Machine shut down during exam, had to repeat measurements. IMPRESSIONS  1. Very poor LV function, contrast swirling in the apex, no clear thrombus but difficult to exclude clot. Left ventricular ejection fraction, by estimation, is 25 to 30%. The left ventricle has severely decreased function. The left ventricle demonstrates regional wall motion abnormalities (see scoring diagram/findings for description). Left ventricular diastolic function could not be evaluated.  2. Right ventricular systolic function is moderately reduced. The right ventricular size is mildly enlarged. There is normal pulmonary artery systolic pressure. The estimated right ventricular systolic pressure is 28.1 mmHg.  3. Right atrial size was mildly dilated.  4. The mitral valve is grossly normal. Trivial mitral valve regurgitation.  5. The aortic valve is tricuspid. Aortic valve regurgitation is not visualized. Aortic valve sclerosis/calcification is present, without any evidence of aortic stenosis.  6. The inferior vena cava is dilated in size with <50% respiratory variability, suggesting right atrial pressure of 15 mmHg. Comparison(s): Changes from prior study are noted. Significant decrease in LV function. FINDINGS  Left Ventricle: Very poor LV function, contrast swirling in the apex, no clear thrombus but difficult to exclude clot. Left ventricular ejection fraction, by estimation, is 25 to 30%. The left ventricle has severely decreased function. The left ventricle  demonstrates regional wall motion abnormalities. Definity  contrast agent was given IV to delineate the left ventricular endocardial borders. The left ventricular internal cavity size was normal in size. There is borderline left ventricular hypertrophy. Left ventricular diastolic function could not be evaluated.  LV Wall Scoring: Inferolateral and anterolateral walls hypokinetic, otherwise akinetic. Right Ventricle: The right ventricular size is mildly enlarged. No increase in right ventricular wall thickness. Right ventricular systolic function is moderately reduced. There is normal pulmonary artery systolic pressure. The tricuspid regurgitant velocity is 1.81 m/s, and with an assumed right atrial pressure of 15 mmHg, the estimated right ventricular systolic pressure is 28.1 mmHg. Left Atrium: Left atrial size was normal in size. Right Atrium: Right atrial size was mildly dilated. Pericardium: There is no evidence of pericardial effusion. Mitral Valve: The mitral valve is grossly normal. Trivial mitral valve regurgitation. Tricuspid Valve: The tricuspid valve is normal in structure. Tricuspid valve regurgitation is mild. Aortic Valve: The aortic valve is tricuspid. Aortic valve regurgitation is not visualized. Aortic valve sclerosis/calcification is present, without any evidence of aortic stenosis. Pulmonic Valve: The pulmonic valve was not well visualized. Pulmonic valve regurgitation is not visualized. Aorta: The aortic root and ascending aorta are structurally normal, with no evidence of dilitation. Venous: The inferior vena cava is dilated in size with less than 50% respiratory variability, suggesting right atrial pressure of 15 mmHg. IAS/Shunts: The interatrial septum was not well visualized.   LV Volumes (MOD) LV vol d, MOD A2C: 72.7 ml LV vol d, MOD A4C: 94.0 ml LV vol s, MOD A2C: 58.4 ml LV vol s, MOD A4C: 49.2 ml LV SV MOD A2C:     14.3 ml LV SV MOD A4C:     94.0 ml LV SV MOD BP:      29.0 ml TRICUSPID  VALVE  TR Peak grad:   13.1 mmHg TR Vmax:        181.00 cm/s Morene Brownie Electronically signed by Morene Brownie Signature Date/Time: 08/20/2023/12:55:04 PM    Final    CT CHEST ABDOMEN PELVIS W CONTRAST Result Date: 08/20/2023 EXAM: CT CHEST, ABDOMEN AND PELVIS WITH CONTRAST 08/20/2023 03:09:45 AM TECHNIQUE: CT of the chest, abdomen and pelvis was performed with the administration of intravenous contrast. Multiplanar reformatted images are provided for review. Automated exposure control, iterative reconstruction, and/or weight based adjustment of the mA/kV was utilized to reduce the radiation dose to as low as reasonably achievable. COMPARISON: CT dated 06/27/2023. CLINICAL HISTORY: Sepsis; Hypotensive, bleeding from penis after Foley catheter removal. Patient BIB EMS from home with complaint of patient pulled out Foley catheter. Patient actively bleeding from penis. Per EMS patient on Eliquis. Wife reports that patient has done this before. FINDINGS: CHEST: MEDIASTINUM: Small to moderate pericardial effusion has increased since 06/27/2023. Coronary artery and aortic atherosclerotic calcifications. THORACIC LYMPH NODES: No mediastinal, hilar or axillary lymphadenopathy. LUNGS AND PLEURA: Bilateral lower lobe scarring or atelectasis redemonstrated. Subsolid nodule in the posterior medial left lower lobe measures 2.0 x 1.9 cm, similar to slightly increased in size since 06/27/2023. Additional subsolid nodule in the right upper lobe anterior to the major fissure measures 1.1 x 1.9 cm on series 7 image 62. ABDOMEN AND PELVIS: LIVER: The liver is unremarkable. GALLBLADDER AND BILE DUCTS: Gallbladder is unremarkable. No biliary ductal dilatation. SPLEEN: No acute abnormality. PANCREAS: No acute abnormality. ADRENAL GLANDS: No acute abnormality. KIDNEYS, URETERS AND BLADDER: Prominent bilateral extrarenal pelvises. Mild bilateral hydroureter, possibly secondary to ascending urinary tract infection. Marked irregular  bladder wall thickening and mucosal hyperenhancement. Numerous bladder diverticula. GI AND BOWEL: Extensive colonic diverticulosis. There is some stranding and fluid about the proximal sigmoid colon, favored related to cystitis; however, uncomplicated diverticulitis could appear similarly. No bowel obstruction. REPRODUCTIVE ORGANS: Marked enlargement of the prostate. PERITONEUM AND RETROPERITONEUM: No free air. VASCULATURE: Aorta is normal in caliber. ABDOMINAL AND PELVIS LYMPH NODES: No lymphadenopathy. BONES AND SOFT TISSUES: Chronic superior endplate compression of T12 and L2. No acute fracture in the chest, abdomen, or pelvis. IMPRESSION: 1. Extensive bladder wall thickening and mucosal Hyperenhancement with multiple bladder diverticula. This may be related to cystitis. Underlying malignancy not excluded. 2. Mild bilateral hydroureter similar to prior. 3. Marked enlargement of the prostate. 4. Small to moderate pericardial effusion, increased since June 27, 2023. 5. Subsolid nodule in the posterior medial left lower lobe, measuring 2.0 x 1.9 cm, similar to slightly increased in size since June 27, 2023. Additional subsolid nodule in the right upper lobe anterior to the major fissure, measuring 1.1 x 1.9 cm. Findings are concerning for bronchogenic carcinoma. 6. Extensive colonic diverticulosis. Stranding and fluid about the proximal sigmoid colon is favored due to cystitis. Mild diverticulitis could appear similarly. Electronically signed by: Norman Gatlin MD 08/20/2023 03:28 AM EDT RP Workstation: [id number]   CT Head Wo Contrast Result Date: 08/20/2023 EXAM: CT HEAD WITHOUT 08/20/2023 03:10:10 AM TECHNIQUE: CT of the head was performed without the administration of intravenous contrast. Automated exposure control, iterative reconstruction, and/or weight based adjustment of the mA/kV was utilized to reduce the radiation dose to as low as reasonably achievable. COMPARISON: 06/27/2023 CLINICAL HISTORY: Mental  status change, unknown cause. FINDINGS: BRAIN AND VENTRICLES: No acute intracranial hemorrhage. No mass effect or midline shift. No extra-axial fluid collection. Gray-white differentiation is maintained. No hydrocephalus. Mild volume loss and chronic ischemic white matter changes or  right thalamic small vessel infarct. ORBITS: No acute abnormality. SINUSES AND MASTOIDS: No acute abnormality. SOFT TISSUES AND SKULL: No acute skull fracture. No acute soft tissue abnormality. IMPRESSION: 1. No acute intracranial abnormality. 2. Mild volume loss and chronic ischemic white matter changes. Electronically signed by: Franky Stanford MD 08/20/2023 03:23 AM EDT RP Workstation: [id number]   DG Pelvis Portable Result Date: 08/20/2023 EXAM: 1 or 2 VIEW(S) XRAY OF THE PELVIS 08/20/2023 02:42:00 AM COMPARISON: None available. CLINICAL HISTORY: Trauma. Penile bleeding discharge. FINDINGS: BONES AND JOINTS: No acute fracture. No focal osseous lesion. No joint dislocation. SOFT TISSUES: The soft tissues are unremarkable. IMPRESSION: 1. No significant abnormality. Electronically signed by: Norman Gatlin MD 08/20/2023 02:59 AM EDT RP Workstation: [id number]   DG Chest Port 1 View Result Date: 08/20/2023 EXAM: 1 VIEW XRAY OF THE CHEST 08/20/2023 02:41:00 AM COMPARISON: 01/27/2023 CLINICAL HISTORY: Trauma. Trauma fall penile discharge FINDINGS: LUNGS AND PLEURA: Left basilar atelectasis or infiltrates. No pleural effusion. No pneumothorax. HEART AND MEDIASTINUM: Stable cardiomediastinal silhouette. Aortic atherosclerotic calcification. BONES AND SOFT TISSUES: No acute osseous abnormality. IMPRESSION: 1. Left basilar atelectasis or infiltrates. Electronically signed by: Norman Gatlin MD 08/20/2023 02:58 AM EDT RP Workstation: [id number]    ------  Ole Bourdon, NP Pager: [phone]   Please contact the urology consult pager with any further questions/concerns.

## 2023-08-20 NOTE — Progress Notes (Deleted)
 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 on Plavix  who presented via EMS due to concern for bleeding from the urethral meatus secondary to trauma from foley removal   Pertinent  Medical History   Past Medical History:  Diagnosis Date   Arthritis    Hemorrhoids    Hypercalcemia    Hyperthyroidism    Peripheral neuropathy      Significant Hospital Events: Including procedures, antibiotic start and stop dates in addition to other pertinent events   8/4 : Admitted 8/4 : Started on Levophed ,maxed out but remain hypotensive   Interim History / Subjective:  Patient is seen at bedside laying in bed in no acute distress with wife at bedside.  Objective   Blood pressure (!) 78/58, pulse (!) 119, temperature (!) 100.9 F (38.3 C), temperature source Axillary, resp. rate (!) 22, height 5' 9.5 (1.765 m), weight 77.5 kg, SpO2 92%.    FiO2 (%):  [45 %] 45 %   Intake/Output Summary (Last 24 hours) at 08/20/2023 0936 Last data filed at 08/20/2023 0900 Gross per 24 hour  Intake 5862.74 ml  Output --  Net 5862.74 ml   Filed Weights   08/20/23 0215  Weight: 77.5 kg    Examination: General: Chronically ill-appearing male, laying in bed in no acute distress HENT: PERRLA, poor dentition Lungs: Clear to auscultation Cardiovascular: Irregular rate and rhythm no significant Abdomen: Soft nontender Extremities: warm and dry  Neuro: More responsive today, answer questions appropriately, sensations intact GU: Foley removed,Swollen pennis   Resolved Hospital Problem list     Assessment & Plan:  Undifferentiated Shock  Lactic acidosis -  Initially presented with septic shock started on Levophed .  He is maxed out on Levophed  but blood pressure is not holding.  Patient remained hypotensive this morning at 79/49.He is warm and dry.  On nonrebreather mask. - Respiratory panel remains pending - Lactic acid of 4.8 < 5, will continue to trend - He is on 16 levo but remains refractory to shock. Considering other cardiologies like arrhthymias, consider. If refractory, may add vasopressin  or switch to Epi vs Steroids  - Avoiding BB and CCB due to pressor requirements  Hypertension Atrial flutter new onset Pericardial effusion-noted on CT scan, small to moderate , TSH unremarkable, - Patient remains in a flutter but asymptomatic - Will continue to trend troponin although I do not think he has ACS - Avoid BB and CCB due to pressors - Give amiodarone   - Will hold antihypertensives at this time - Follow-up on echo read for pericardial effusion  BPH  Urinary Retention Hematuria -Continue to hold Foley until urology sees patient.   Hyperglycemia  - Blood glucose of 153 this morning - Continue SSI - CGM   Hypokalemia -Potassium 2.2 this morning -Replete and repeat BMP  Hx of CVA - CT head unremarkable for any acute intracranial abnormalities - Resume Plavix    Ocular myasthenia gravis - Continue Mestinon  when able    Hyperlipidemia Resume home Crestor  when able    Peripheral neuropathy - On home gabapentin , will hold in the setting of hypotension and dizziness.   Frequent falls noted on previous admission -Fall precautions in place  GOC  In the setting of his multiple co morbidities, family discussions were need to be had regarding goals of care.  Patient is currently on Levophed  but remains hypotensive.  Patient continued to be trauma despite adequate medical management, comfort care discussions will be appropriate will continue  Best Practice (right click and Reselect all SmartList Selections daily)   Diet/type:  NPO DVT prophylaxis: SCD GI prophylaxis: PPI Lines: N/A Foley:  N/A Code Status:  DNRDNI Last date of multidisciplinary goals of care discussion [as above ]  Labs   CBC: Recent Labs  Lab 08/20/23 0218 08/20/23 0229 08/20/23 0235 08/20/23 0620  WBC 3.9*  --   --  19.5*  HGB 12.5* 12.9* 12.6* 12.9*  HCT 39.6 38.0* 37.0* 38.5*  MCV 93.6  --   --  91.4  PLT 228  --   --  189    Basic Metabolic Panel: Recent Labs  Lab 08/20/23 0218 08/20/23 0229 08/20/23 0235 08/20/23 0620  NA 139 140 140 141  K 3.1* 3.1* 3.0* 2.6*  CL 104 102  --  108  CO2 20*  --   --  20*  GLUCOSE 209* 208*  --  134*  BUN 14 15  --  16  CREATININE 1.23 1.10  --  1.17  CALCIUM  9.0  --   --  8.3*  MG  --   --   --  1.3*  PHOS  --   --   --  2.0*   GFR: Estimated Creatinine Clearance: 42.7 mL/min (by C-G formula based on SCr of 1.17 mg/dL). Recent Labs  Lab 08/20/23 0218 08/20/23 0231 08/20/23 0620  PROCALCITON  --   --  33.39  WBC 3.9*  --  19.5*  LATICACIDVEN  --  5.0* 4.8*    Liver Function Tests: Recent Labs  Lab 08/20/23 0218  AST 23  ALT 13  ALKPHOS 75  BILITOT 1.5*  PROT 6.1*  ALBUMIN 3.2*   Recent Labs  Lab 08/20/23 0218  LIPASE 22   No results for input(s): AMMONIA in the last 168 hours.  ABG    Component Value Date/Time   HCO3 22.6 08/20/2023 0235   TCO2 24 08/20/2023 0235   ACIDBASEDEF 3.0 (H) 08/20/2023 0235   O2SAT 98 08/20/2023 0235     Coagulation Profile: Recent Labs  Lab 08/20/23 0218  INR 1.2    Cardiac Enzymes: No results for input(s): CKTOTAL, CKMB, CKMBINDEX, TROPONINI in the last 168 hours.  HbA1C: Hgb A1c MFr Bld  Date/Time Value Ref Range Status  02/25/2023 02:20 PM 5.7 (H) 4.8 - 5.6 % Final    Comment:    (NOTE) Pre diabetes:          5.7%-6.4%  Diabetes:              >6.4%  Glycemic control for   <7.0% adults with diabetes   08/23/2022 10:32 AM 6.1 (H) 4.8 - 5.6 % Final    Comment:             Prediabetes: 5.7 -  6.4          Diabetes: >6.4          Glycemic control for adults with diabetes: <7.0     CBG: Recent Labs  Lab 08/20/23 0217 08/20/23 0731  GLUCAP 170* 153*    Review of Systems:   Denies any chest, pain, fever or chills.   Past Medical History:  He,  has a past medical history of Arthritis, Hemorrhoids, Hypercalcemia,  Hyperthyroidism, and Peripheral neuropathy.   Surgical History:   Past Surgical History:  Procedure Laterality Date   HEMORRHOID SURGERY     PARATHYROIDECTOMY  10/13/10     Social History:   reports that he has quit smoking. He has never used smokeless tobacco. He reports that he does not drink alcohol  and does not use drugs.   Family History:  His family history includes Cancer in his sister.   Allergies No Known Allergies   Home Medications  Prior to Admission medications   Medication Sig Start Date End Date Taking? Authorizing Provider  alfuzosin (UROXATRAL) 10 MG 24 hr tablet Take 10 mg by mouth at bedtime.   Yes [provider]  clopidogrel  (PLAVIX ) 75 MG tablet Take 1 tablet (75 mg total) by mouth daily. 07/09/23  Yes Odell Celinda Balo, MD  finasteride  (PROSCAR ) 5 MG tablet Take 5 mg by mouth daily.   Yes [provider]  gabapentin  (NEURONTIN ) 300 MG capsule Take 1 capsule (300 mg total) by mouth in the morning AND 2 capsules (600 mg total) at bedtime. Patient taking differently: Take 3 capsules (900mg ) by mouth at bedtime 07/09/23  Yes Odell Celinda Balo, MD  levothyroxine  (SYNTHROID ) 100 MCG tablet Take 1 tablet (100 mcg total) by mouth daily. 02/28/23  Yes Singh, Prashant K, MD  loratadine  (CLARITIN ) 10 MG tablet Take 10 mg by mouth daily.   Yes [provider]  pyridostigmine  (MESTINON ) 60 MG tablet 0.5 to one tab up to 3 times a day as needed. Patient taking differently: Take 60 mg by mouth every evening. 0.5 to one tab up to 3 times a day as needed. 07/16/23  Yes Onita Duos, MD  rosuvastatin  (CRESTOR ) 20 MG tablet  Take 1 tablet (20 mg total) by mouth daily. 02/28/23  Yes Dennise Lavada POUR, MD  tamsulosin  (FLOMAX ) 0.4 MG CAPS capsule Take 2 capsules (0.8 mg total) by mouth daily. Patient not taking: Reported on 08/20/2023 07/09/23   Odell Celinda Balo, MD     Critical care time: 1 hour      Drue Grow MD [facility] Internal Medicine Program - PGY-2 08/20/2023, 9:36 AM Pager# [phone]

## 2023-08-20 NOTE — Progress Notes (Signed)
 PHARMACY - PHYSICIAN COMMUNICATION CRITICAL VALUE ALERT - BLOOD CULTURE IDENTIFICATION (BCID)  Steve Thompson is an 89 y.o. male who presented to [facility] on 08/20/2023 with a chief complaint of concern for bleeding from the urethral meatus secondary to trauma from foley removal.   Assessment:  1/3 blood cultures with enterococcus faecalis (vanA/B not detected) source most likely from catheter  Name of physician (or Provider) Contacted: Dr. Joelyn  Current antibiotics: Zosyn  3.375gm IV every 8 hours   Changes to prescribed antibiotics recommended:   -Continue current course for now and continue to follow with patient cultures and clinical status prior to de-escalating to ampicillin  2g IV every 6 hours (renally adjusted)  Results for orders placed or performed during the hospital encounter of 08/20/23  Blood Culture ID Panel (Reflexed) (Collected: 08/20/2023  6:20 AM)  Result Value Ref Range   Enterococcus faecalis DETECTED (A) NOT DETECTED   Enterococcus Faecium NOT DETECTED NOT DETECTED   Listeria monocytogenes NOT DETECTED NOT DETECTED   Staphylococcus species NOT DETECTED NOT DETECTED   Staphylococcus aureus (BCID) NOT DETECTED NOT DETECTED   Staphylococcus epidermidis NOT DETECTED NOT DETECTED   Staphylococcus lugdunensis NOT DETECTED NOT DETECTED   Streptococcus species NOT DETECTED NOT DETECTED   Streptococcus agalactiae NOT DETECTED NOT DETECTED   Streptococcus pneumoniae NOT DETECTED NOT DETECTED   Streptococcus pyogenes NOT DETECTED NOT DETECTED   A.calcoaceticus-baumannii NOT DETECTED NOT DETECTED   Bacteroides fragilis NOT DETECTED NOT DETECTED   Enterobacterales NOT DETECTED NOT DETECTED   Enterobacter cloacae complex NOT DETECTED NOT DETECTED   Escherichia coli NOT DETECTED NOT DETECTED   Klebsiella aerogenes NOT DETECTED NOT DETECTED   Klebsiella oxytoca NOT DETECTED NOT DETECTED   Klebsiella pneumoniae NOT DETECTED NOT DETECTED   Proteus species NOT DETECTED NOT  DETECTED   Salmonella species NOT DETECTED NOT DETECTED   Serratia marcescens NOT DETECTED NOT DETECTED   Haemophilus influenzae NOT DETECTED NOT DETECTED   Neisseria meningitidis NOT DETECTED NOT DETECTED   Pseudomonas aeruginosa NOT DETECTED NOT DETECTED   Stenotrophomonas maltophilia NOT DETECTED NOT DETECTED   Candida albicans NOT DETECTED NOT DETECTED   Candida auris NOT DETECTED NOT DETECTED   Candida glabrata NOT DETECTED NOT DETECTED   Candida krusei NOT DETECTED NOT DETECTED   Candida parapsilosis NOT DETECTED NOT DETECTED   Candida tropicalis NOT DETECTED NOT DETECTED   Cryptococcus neoformans/gattii NOT DETECTED NOT DETECTED   Vancomycin  resistance NOT DETECTED NOT DETECTED    Lynwood Poplar, PharmD, BCPS Clinical Pharmacist 08/20/2023 11:02 PM

## 2023-08-20 NOTE — Progress Notes (Signed)
 Lactates delayed due to multiple blood transfusions.

## 2023-08-21 DIAGNOSIS — Z515 Encounter for palliative care: Secondary | ICD-10-CM

## 2023-08-21 DIAGNOSIS — Z7189 Other specified counseling: Secondary | ICD-10-CM | POA: Diagnosis not present

## 2023-08-21 DIAGNOSIS — R579 Shock, unspecified: Principal | ICD-10-CM

## 2023-08-21 DIAGNOSIS — B952 Enterococcus as the cause of diseases classified elsewhere: Secondary | ICD-10-CM | POA: Diagnosis present

## 2023-08-21 DIAGNOSIS — I5021 Acute systolic (congestive) heart failure: Secondary | ICD-10-CM

## 2023-08-21 DIAGNOSIS — A419 Sepsis, unspecified organism: Secondary | ICD-10-CM | POA: Diagnosis not present

## 2023-08-21 DIAGNOSIS — R6521 Severe sepsis with septic shock: Secondary | ICD-10-CM | POA: Diagnosis not present

## 2023-08-21 DIAGNOSIS — Z9189 Other specified personal risk factors, not elsewhere classified: Secondary | ICD-10-CM

## 2023-08-21 DIAGNOSIS — A4181 Sepsis due to Enterococcus: Secondary | ICD-10-CM

## 2023-08-21 LAB — RENAL FUNCTION PANEL
Albumin: 2.8 g/dL — ABNORMAL LOW (ref 3.5–5.0)
Anion gap: 10 (ref 5–15)
BUN: 24 mg/dL — ABNORMAL HIGH (ref 8–23)
CO2: 26 mmol/L (ref 22–32)
Calcium: 8.9 mg/dL (ref 8.9–10.3)
Chloride: 101 mmol/L (ref 98–111)
Creatinine, Ser: 1.29 mg/dL — ABNORMAL HIGH (ref 0.61–1.24)
GFR, Estimated: 53 mL/min — ABNORMAL LOW (ref >60)
Glucose, Bld: 116 mg/dL — ABNORMAL HIGH (ref 70–99)
Phosphorus: 3.5 mg/dL (ref 2.5–4.6)
Potassium: 3.8 mmol/L (ref 3.5–5.1)
Sodium: 137 mmol/L (ref 135–145)

## 2023-08-21 LAB — BASIC METABOLIC PANEL WITH GFR
Anion gap: 9 (ref 5–15)
BUN: 25 mg/dL — ABNORMAL HIGH (ref 8–23)
CO2: 20 mmol/L — ABNORMAL LOW (ref 22–32)
Calcium: 8.3 mg/dL — ABNORMAL LOW (ref 8.9–10.3)
Chloride: 105 mmol/L (ref 98–111)
Creatinine, Ser: 1.13 mg/dL (ref 0.61–1.24)
GFR, Estimated: >60 mL/min (ref >60)
Glucose, Bld: 102 mg/dL — ABNORMAL HIGH (ref 70–99)
Potassium: 4.4 mmol/L (ref 3.5–5.1)
Sodium: 134 mmol/L — ABNORMAL LOW (ref 135–145)

## 2023-08-21 LAB — GLUCOSE, CAPILLARY
Glucose-Capillary: 103 mg/dL — ABNORMAL HIGH (ref 70–99)
Glucose-Capillary: 105 mg/dL — ABNORMAL HIGH (ref 70–99)
Glucose-Capillary: 108 mg/dL — ABNORMAL HIGH (ref 70–99)
Glucose-Capillary: 86 mg/dL (ref 70–99)
Glucose-Capillary: 92 mg/dL (ref 70–99)
Glucose-Capillary: 95 mg/dL (ref 70–99)

## 2023-08-21 LAB — CBC
HCT: 33.9 % — ABNORMAL LOW (ref 39.0–52.0)
Hemoglobin: 11.4 g/dL — ABNORMAL LOW (ref 13.0–17.0)
MCH: 29.8 pg (ref 26.0–34.0)
MCHC: 33.6 g/dL (ref 30.0–36.0)
MCV: 88.7 fL (ref 80.0–100.0)
Platelets: 172 K/uL (ref 150–400)
RBC: 3.82 MIL/uL — ABNORMAL LOW (ref 4.22–5.81)
RDW: 14.7 % (ref 11.5–15.5)
WBC: 38.7 K/uL — ABNORMAL HIGH (ref 4.0–10.5)
nRBC: 0 % (ref 0.0–0.2)

## 2023-08-21 LAB — MAGNESIUM
Magnesium: 1.5 mg/dL — ABNORMAL LOW (ref 1.7–2.4)
Magnesium: 1.7 mg/dL (ref 1.7–2.4)

## 2023-08-21 LAB — STREP PNEUMONIAE URINARY ANTIGEN: Strep Pneumo Urinary Antigen: NEGATIVE

## 2023-08-21 LAB — HEMOGLOBIN A1C
Hgb A1c MFr Bld: 5.8 % — ABNORMAL HIGH (ref 4.8–5.6)
Mean Plasma Glucose: 120 mg/dL

## 2023-08-21 LAB — PHOSPHORUS: Phosphorus: 3.5 mg/dL (ref 2.5–4.6)

## 2023-08-21 MED ORDER — PYRIDOSTIGMINE BROMIDE 60 MG PO TABS
60.0000 mg | ORAL_TABLET | Freq: Every evening | ORAL | Status: DC
  Administered 2023-08-21: 60 mg via ORAL
  Filled 2023-08-21 (×4): qty 1

## 2023-08-21 MED ORDER — SODIUM CHLORIDE 0.9 % IV SOLN
2.0000 g | Freq: Four times a day (QID) | INTRAVENOUS | Status: DC
  Administered 2023-08-21 – 2023-08-22 (×5): 2 g via INTRAVENOUS
  Filled 2023-08-21 (×8): qty 2000

## 2023-08-21 MED ORDER — ORAL CARE MOUTH RINSE: 15.0000 mL | OROMUCOSAL | Status: DC | PRN

## 2023-08-21 MED ORDER — GABAPENTIN 300 MG PO CAPS
300.0000 mg | ORAL_CAPSULE | Freq: Every morning | ORAL | Status: DC
  Administered 2023-08-21 – 2023-08-23 (×3): 300 mg via ORAL
  Filled 2023-08-21 (×3): qty 1

## 2023-08-21 MED ORDER — GABAPENTIN 300 MG PO CAPS: 900.0000 mg | ORAL_CAPSULE | Freq: Every day | ORAL | Status: DC

## 2023-08-21 MED ORDER — ROSUVASTATIN CALCIUM 20 MG PO TABS
20.0000 mg | ORAL_TABLET | Freq: Every day | ORAL | Status: DC
  Administered 2023-08-21: 20 mg via ORAL
  Filled 2023-08-21: qty 1

## 2023-08-21 MED ORDER — FUROSEMIDE 10 MG/ML IJ SOLN
40.0000 mg | Freq: Once | INTRAMUSCULAR | Status: AC
  Administered 2023-08-21: 40 mg via INTRAVENOUS
  Filled 2023-08-21: qty 4

## 2023-08-21 MED ORDER — FINASTERIDE 5 MG PO TABS
5.0000 mg | ORAL_TABLET | Freq: Every day | ORAL | Status: DC
  Administered 2023-08-21 – 2023-08-23 (×3): 5 mg via ORAL
  Filled 2023-08-21 (×3): qty 1

## 2023-08-21 MED ORDER — CLOPIDOGREL BISULFATE 75 MG PO TABS
75.0000 mg | ORAL_TABLET | Freq: Every day | ORAL | Status: DC
Start: 2023-08-21 — End: 2023-08-22
  Administered 2023-08-21: 75 mg via ORAL
  Filled 2023-08-21: qty 1

## 2023-08-21 MED ORDER — GABAPENTIN 300 MG PO CAPS
600.0000 mg | ORAL_CAPSULE | Freq: Every day | ORAL | Status: DC
  Administered 2023-08-21 – 2023-08-22 (×2): 600 mg via ORAL
  Filled 2023-08-21 (×2): qty 2

## 2023-08-21 MED ORDER — LEVOTHYROXINE SODIUM 100 MCG PO TABS
100.0000 ug | ORAL_TABLET | Freq: Every day | ORAL | Status: DC
  Administered 2023-08-21 – 2023-08-23 (×3): 100 ug via ORAL
  Filled 2023-08-21 (×3): qty 1

## 2023-08-21 NOTE — Consult Note (Signed)
 [facility]    Date of Admission:  08/20/2023     Total days of antibiotics 1   Ampicillin  8/05 >> c          Reason for Consult: Enterococcal bacteremia     Referring Provider: Hattar Primary Care Provider: Okey Carlin Redbird, MD   Assessment: Steve Thompson is a 89 y.o. male admitted from home with blood from urethral meatus and weakness. Found to have:   Enterococcus Faecalis Bacteremia -  Traumatic Foley Catheter removal / reinsertion -  H/O Chronic Indwelling Foley Catheter -  Most likely the cause of the bacteremia is related to traumatic foley catheter displacement and urinary source. His fevers have resolved. Leukocytosis peaked to 38.7K today but I suspect this will down trend to match fever curve with adequate treatment. Would continue ampicillin  alone for now and consider move to high dose amoxicillin at discharge to complete 14 day course.  TTE done and no signs of infections - no cardiac devices. Would not proceed with TEE.   Sepsis -  Physiology resolved and off pressors. He looks pretty good today and improved fairly quickly.   AKI -  Resolved   New Cardiomyopathy -  LVEF 25% -  Noted on 8/4 echo. Suspect acutely depressed in setting of sepsis.   Goals of Care -  Discussions noted with palliative medicine team and planning on comfort approach for him with overall cognitive decline and struggle at home with advancing age/neurocognitive decline.  Please continue antibiotics until further decisions made. Would also be reasonable to treat with high dose oral amoxicillin for 14 days given it seems most likely his bacteremia was transient from traumatic foley catheter and is a curable non-chronic process.   Plan: Continue ampicillin   Would transition to high dose oral PO amoxicillin to co   Principal Problem:   Enterococcal bacteremia Active Problems:   Septic shock (HCC)   Palliative care by specialist   Goals of care,  counseling/discussion    sodium chloride    Intravenous Once   Chlorhexidine  Gluconate Cloth  6 each Topical Daily   clopidogrel   75 mg Oral Daily   finasteride   5 mg Oral Daily   gabapentin   300 mg Oral q AM   gabapentin   600 mg Oral QHS   heparin  injection (subcutaneous)  5,000 Units Subcutaneous Q8H   insulin  aspart  2-6 Units Subcutaneous Q4H   levothyroxine   100 mcg Oral Daily   pyridostigmine   60 mg Oral QPM   rosuvastatin   20 mg Oral Daily    HPI: Steve Thompson is a 89 y.o. male admitted from home with weakness that was progressively worsening. He feels much better today. Says his foley catheter was accidentally removed then replaced.   Came in after pulling out chronic foley catheter (unclear if he removed it or it was pulled out accidentally). Progressively weak and felt bad.   Review of Systems: Review of Systems  Constitutional:  Negative for chills and fever.  Gastrointestinal:  Negative for abdominal pain and vomiting.  Genitourinary:  Positive for hematuria.    Past Medical History:  Diagnosis Date   Arthritis    Hemorrhoids    Hypercalcemia    Hyperthyroidism    Peripheral neuropathy     Social History   Tobacco Use   Smoking status: Former   Smokeless tobacco: Never  Substance Use Topics   Alcohol  use: No   Drug use: No  Family History  Problem Relation Age of Onset   Cancer Sister        lung   No Known Allergies  OBJECTIVE: Blood pressure 108/64, pulse 78, temperature 97.7 F (36.5 C), temperature source Oral, resp. rate 17, height 5' 9.5 (1.765 m), weight 90.8 kg, SpO2 92%.  Physical Exam Vitals reviewed.  Constitutional:      Appearance: He is ill-appearing.  Cardiovascular:     Rate and Rhythm: Normal rate.  Pulmonary:     Effort: Pulmonary effort is normal.     Breath sounds: Normal breath sounds.  Neurological:     Mental Status: He is alert.     Lab Results Lab Results  Component Value Date   WBC 38.7 (H) 08/21/2023    HGB 11.4 (L) 08/21/2023   HCT 33.9 (L) 08/21/2023   MCV 88.7 08/21/2023   PLT 172 08/21/2023    Lab Results  Component Value Date   CREATININE 1.13 08/21/2023   BUN 25 (H) 08/21/2023   NA 134 (L) 08/21/2023   K 4.4 08/21/2023   CL 105 08/21/2023   CO2 20 (L) 08/21/2023    Lab Results  Component Value Date   ALT 13 08/20/2023   AST 23 08/20/2023   ALKPHOS 75 08/20/2023   BILITOT 1.5 (H) 08/20/2023     Microbiology: Recent Results (from the past 240 hours)  Respiratory (~20 pathogens) panel by PCR     Status: None   Collection Time: 08/20/23  5:03 AM   Specimen: Anterior Nasal Swab; Respiratory  Result Value Ref Range Status   Adenovirus NOT DETECTED NOT DETECTED Final   Coronavirus 229E NOT DETECTED NOT DETECTED Final    Comment: (NOTE) The Coronavirus on the Respiratory Panel, DOES NOT test for the novel  Coronavirus (2019 nCoV)    Coronavirus HKU1 NOT DETECTED NOT DETECTED Final   Coronavirus NL63 NOT DETECTED NOT DETECTED Final   Coronavirus OC43 NOT DETECTED NOT DETECTED Final   Metapneumovirus NOT DETECTED NOT DETECTED Final   Rhinovirus / Enterovirus NOT DETECTED NOT DETECTED Final   Influenza A NOT DETECTED NOT DETECTED Final   Influenza B NOT DETECTED NOT DETECTED Final   Parainfluenza Virus 1 NOT DETECTED NOT DETECTED Final   Parainfluenza Virus 2 NOT DETECTED NOT DETECTED Final   Parainfluenza Virus 3 NOT DETECTED NOT DETECTED Final   Parainfluenza Virus 4 NOT DETECTED NOT DETECTED Final   Respiratory Syncytial Virus NOT DETECTED NOT DETECTED Final   Bordetella pertussis NOT DETECTED NOT DETECTED Final   Bordetella Parapertussis NOT DETECTED NOT DETECTED Final   Chlamydophila pneumoniae NOT DETECTED NOT DETECTED Final   Mycoplasma pneumoniae NOT DETECTED NOT DETECTED Final    Comment: Performed at [facility], [address], [place], [place] [address]  Resp panel by RT-PCR (RSV, Flu A&B, Covid) Anterior Nasal Swab     Status: None   Collection  Time: 08/20/23  5:03 AM   Specimen: Anterior Nasal Swab  Result Value Ref Range Status   SARS Coronavirus 2 by RT PCR NEGATIVE NEGATIVE Final   Influenza A by PCR NEGATIVE NEGATIVE Final   Influenza B by PCR NEGATIVE NEGATIVE Final    Comment: (NOTE) The Xpert Xpress SARS-CoV-2/FLU/RSV plus assay is intended as an aid in the diagnosis of influenza from Nasopharyngeal swab specimens and should not be used as a sole basis for treatment. Nasal washings and aspirates are unacceptable for Xpert Xpress SARS-CoV-2/FLU/RSV testing.  Fact Sheet for Patients: BloggerCourse.com  Fact Sheet for  Healthcare Providers: SeriousBroker.it  This test is not yet approved or cleared by the [place]  FDA and has been authorized for detection and/or diagnosis of SARS-CoV-2 by FDA under an Emergency Use Authorization (EUA). This EUA will remain in effect (meaning this test can be used) for the duration of the COVID-19 declaration under Section 564(b)(1) of the Act, 21 U.S.C. section 360bbb-3(b)(1), unless the authorization is terminated or revoked.     Resp Syncytial Virus by PCR NEGATIVE NEGATIVE Final    Comment: (NOTE) Fact Sheet for Patients: BloggerCourse.com  Fact Sheet for Healthcare Providers: SeriousBroker.it  This test is not yet approved or cleared by the [place]  FDA and has been authorized for detection and/or diagnosis of SARS-CoV-2 by FDA under an Emergency Use Authorization (EUA). This EUA will remain in effect (meaning this test can be used) for the duration of the COVID-19 declaration under Section 564(b)(1) of the Act, 21 U.S.C. section 360bbb-3(b)(1), unless the authorization is terminated or revoked.  Performed at [facility], [address] [place], [place] [address]   MRSA Next Gen by PCR, Nasal     Status: None   Collection Time: 08/20/23  5:40 AM    Specimen: Nasal Mucosa; Nasal Swab  Result Value Ref Range Status   MRSA by PCR Next Gen NOT DETECTED NOT DETECTED Final    Comment: (NOTE) The GeneXpert MRSA Assay (FDA approved for NASAL specimens only), is one component of a comprehensive MRSA colonization surveillance program. It is not intended to diagnose MRSA infection nor to guide or monitor treatment for MRSA infections. Test performance is not FDA approved in patients less than 32 years old. Performed at [facility], [address] [place], [place] [address]   Blood culture (routine x 2)     Status: None (Preliminary result)   Collection Time: 08/20/23  6:20 AM   Specimen: BLOOD RIGHT HAND  Result Value Ref Range Status   Specimen Description BLOOD RIGHT HAND  Final   Special Requests   Final    BOTTLES DRAWN AEROBIC AND ANAEROBIC Blood Culture results may not be optimal due to an inadequate volume of blood received in culture bottles   Culture  Setup Time   Final    GRAM POSITIVE COCCI IN CHAINS ANAEROBIC BOTTLE ONLY CRITICAL RESULT CALLED TO, READ BACK BY AND VERIFIED WITH: PHARMD J. [facility] [id number] @ 2300 FH Performed at [facility], [address], [place], [place] [address]    Culture GRAM POSITIVE COCCI  Final   Report Status PENDING  Incomplete  Blood Culture ID Panel (Reflexed)     Status: Abnormal   Collection Time: 08/20/23  6:20 AM  Result Value Ref Range Status   Enterococcus faecalis DETECTED (A) NOT DETECTED Final    Comment: CRITICAL RESULT CALLED TO, READ BACK BY AND VERIFIED WITH: PHARMD J. LARON [id number] @ 2300 FH    Enterococcus Faecium NOT DETECTED NOT DETECTED Final   Listeria monocytogenes NOT DETECTED NOT DETECTED Final   Staphylococcus species NOT DETECTED NOT DETECTED Final   Staphylococcus aureus (BCID) NOT DETECTED NOT DETECTED Final   Staphylococcus epidermidis NOT DETECTED NOT DETECTED Final   Staphylococcus lugdunensis NOT DETECTED NOT DETECTED Final   Streptococcus species  NOT DETECTED NOT DETECTED Final   Streptococcus agalactiae NOT DETECTED NOT DETECTED Final   Streptococcus pneumoniae NOT DETECTED NOT DETECTED Final   Streptococcus pyogenes NOT DETECTED NOT DETECTED Final   A.calcoaceticus-baumannii NOT DETECTED NOT DETECTED Final  Bacteroides fragilis NOT DETECTED NOT DETECTED Final   Enterobacterales NOT DETECTED NOT DETECTED Final   Enterobacter cloacae complex NOT DETECTED NOT DETECTED Final   Escherichia coli NOT DETECTED NOT DETECTED Final   Klebsiella aerogenes NOT DETECTED NOT DETECTED Final   Klebsiella oxytoca NOT DETECTED NOT DETECTED Final   Klebsiella pneumoniae NOT DETECTED NOT DETECTED Final   Proteus species NOT DETECTED NOT DETECTED Final   Salmonella species NOT DETECTED NOT DETECTED Final   Serratia marcescens NOT DETECTED NOT DETECTED Final   Haemophilus influenzae NOT DETECTED NOT DETECTED Final   Neisseria meningitidis NOT DETECTED NOT DETECTED Final   Pseudomonas aeruginosa NOT DETECTED NOT DETECTED Final   Stenotrophomonas maltophilia NOT DETECTED NOT DETECTED Final   Candida albicans NOT DETECTED NOT DETECTED Final   Candida auris NOT DETECTED NOT DETECTED Final   Candida glabrata NOT DETECTED NOT DETECTED Final   Candida krusei NOT DETECTED NOT DETECTED Final   Candida parapsilosis NOT DETECTED NOT DETECTED Final   Candida tropicalis NOT DETECTED NOT DETECTED Final   Cryptococcus neoformans/gattii NOT DETECTED NOT DETECTED Final   Vancomycin  resistance NOT DETECTED NOT DETECTED Final    Comment: Performed at [facility], [address], [place], [place] [address]  Blood culture (routine x 2)     Status: None (Preliminary result)   Collection Time: 08/20/23  6:23 AM   Specimen: BLOOD RIGHT HAND  Result Value Ref Range Status   Specimen Description BLOOD RIGHT HAND  Final   Special Requests AEROBIC BOTTLE ONLY Blood Culture adequate volume  Final   Culture  Setup Time   Final    GRAM POSITIVE COCCI IN  CHAINS AEROBIC BOTTLE ONLY CRITICAL VALUE NOTED.  VALUE IS CONSISTENT WITH PREVIOUSLY REPORTED AND CALLED VALUE.    Culture   Final    NO GROWTH 1 DAY Performed at [facility], [address] [place], [place] [address]    Report Status PENDING  Incomplete    Corean Fireman, MSN, NP-C [facility] Pager: [phone]  08/21/2023 10:27 AM    Total Encounter Time: 12 min

## 2023-08-21 NOTE — Progress Notes (Signed)
 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  Pertinent  Medical History   Past Medical History:  Diagnosis Date   Arthritis    Hemorrhoids    Hypercalcemia    Hyperthyroidism    Peripheral neuropathy      Significant Hospital Events: Including procedures, antibiotic start and stop dates in addition to other pertinent events   8/4 : Admitted 8/4 : Started on Levophed ,maxed out but remain hypotensive  8/4: Started on Vasso  Interim History / Subjective:  Steve Thompson is doing much better today, Steve Thompson was laying in bed eating his breakfast this morning denies any chest pain, shortness of breath and had no complaints.  Steve Thompson is off all pressors and his blood pressure is holding really well.  Amiodarone  was also discontinued.  Steve Thompson denies any suprapubic pain his Foley remains in place and intact.  Objective   Blood pressure 108/64, pulse 78, temperature 97.7 F (36.5 C), temperature source Oral, resp. rate 17, height 5' 9.5 (1.765 m), weight 90.8 kg, SpO2 92%.        Intake/Output Summary (Last 24 hours) at 08/21/2023 1044 Last data filed at 08/21/2023 1000 Gross per 24 hour  Intake 2240.38 ml  Output 2605 ml  Net -364.62 ml   Filed Weights   08/20/23 0215 08/21/23 0030  Weight: 77.5 kg 90.8 kg    Examination: General: ill-appearing male, laying in bed in no acute distress HENT: PERRLA, poor dentition Lungs: Clear to auscultation Cardiovascular: Irregular rate and rhythm no significant Abdomen: Soft  nontender Extremities: warm and dry  Neuro: More responsive today, answer questions appropriately, sensations intact GU: Foley removed,Swollen pennis   Resolved Hospital Problem list   Septic shock   Assessment & Plan:  Undifferentiated Shock  Lactic acidosis Enterococcus bacteremia  -Blood cultures grew enterococcus fecalis  -Steve Thompson is off all pressors this stable. -Will switch Zosyn  to ampicillin  for 6 more days - Will switch to ampicillin  to amoxicillin on  discharge if needed  - Stopping steroids today  Hypertension Atrial flutter new onset Stress Cardiomyopathy - His new onset of a flutter most likely due to his sepsis.  Steve Thompson is in regular rate and rhythm this morning. - Patient may need an outpatient follow-up with cardiology - For his hypertension blood pressure continue to remain soft will hold all antihypertensives until Steve Thompson follows up with his primary care physician. - New echo 8/4 LVEF 20-25% compared to June 2025 LVEF 65-70% like stress induced cardiomyopathy. I think  consulting Cardiology may be appropriate. May need repeat ECHO. Steve Thompson is net positive 7 L. Will give 40 IV Lasix  and check BMP after 6hrs  BPH  Urinary Retention Hematuria - Urology saw and placed his Foley, has not had any issues with that. - Will continue to monitor while Steve Thompson is here with us  in the hospital   Hyperglycemia  - Blood glucose is getting better to 105 today - Continue SSI - CGM   Hypokalemia - Resolved - Daily BMP  Hx of CVA - Resume Plavix    Ocular myasthenia gravis -  Continue Mestinon  when able    Hyperlipidemia -Resume home Crestor    Peripheral neuropathy - On home gabapentin , will hold in the setting of hypotension and dizziness.   Frequent falls noted on previous admission -Fall precautions in place  GOC For goals of care conversation Steve Thompson has really turned around a corner and is doing well.  Will defer goals of conversation to palliative.   Best Practice (right click  and Reselect all SmartList Selections daily)   Diet/type: Regular consistency (see orders) and NPO DVT prophylaxis: Heparin  [place] GI prophylaxis: N/A Lines: N/A Foley:  Yes, and it is still needed Code Status:  DNRDNI Last date of multidisciplinary goals of care discussion [as above ]  Labs   CBC: Recent Labs  Lab 08/20/23 0218 08/20/23 0229 08/20/23 0235 08/20/23 0620 08/21/23 0433  WBC 3.9*  --   --  19.5* 38.7*  HGB 12.5* 12.9* 12.6* 12.9* 11.4*  HCT 39.6 38.0* 37.0* 38.5* 33.9*  MCV 93.6  --   --  91.4 88.7  PLT 228  --   --  189 172    Basic Metabolic Panel: Recent Labs  Lab 08/20/23 0218 08/20/23 0229 08/20/23 0235 08/20/23 0620 08/20/23 0914 08/20/23 1604 08/21/23 0433  NA 139 140 140 141 139 135 134*  K 3.1* 3.1* 3.0* 2.6* 3.2* 3.6 4.4  CL 104 102  --  108 107 105 105  CO2 20*  --   --  20* 19* 18* 20*  GLUCOSE 209* 208*  --  134* 163* 216* 102*  BUN 14 15  --  16 18 22  25*  CREATININE 1.23 1.10  --  1.17 1.26* 1.33* 1.13  CALCIUM  9.0  --   --  8.3* 8.0* 8.1* 8.3*  MG  --   --   --  1.3*  --   --  1.5*  PHOS  --   --   --  2.0*  --   --  3.5   GFR: Estimated Creatinine Clearance: 48.9 mL/min (by C-G formula based on SCr of 1.13 mg/dL). Recent Labs  Lab 08/20/23 0218 08/20/23 0231 08/20/23 0620 08/20/23 0914 08/21/23 0433  PROCALCITON  --   --  33.39  --   --   WBC 3.9*  --  19.5*  --  38.7*  LATICACIDVEN  --  5.0* 4.8* 3.3*  --     Liver Function Tests: Recent Labs  Lab 08/20/23 0218  AST 23  ALT 13  ALKPHOS 75  BILITOT 1.5*  PROT 6.1*  ALBUMIN 3.2*   Recent Labs  Lab 08/20/23 0218  LIPASE 22   No results for input(s): AMMONIA in the last 168 hours.  ABG    Component Value Date/Time   HCO3 22.6 08/20/2023 0235   TCO2 24 08/20/2023 0235   ACIDBASEDEF 3.0 (H) 08/20/2023 0235   O2SAT 98 08/20/2023 0235     Coagulation Profile: Recent Labs  Lab 08/20/23 0218  INR 1.2    Cardiac Enzymes: No results for input(s):  CKTOTAL, CKMB, CKMBINDEX, TROPONINI in the last 168 hours.  HbA1C: Hgb A1c MFr Bld  Date/Time Value Ref Range Status  08/20/2023 06:20 AM 5.8 (H) 4.8 - 5.6 % Final    Comment:    (NOTE)         Prediabetes: 5.7 - 6.4         Diabetes: >6.4         Glycemic control for adults with diabetes: <7.0   02/25/2023 02:20 PM 5.7 (H)  4.8 - 5.6 % Final    Comment:    (NOTE) Pre diabetes:          5.7%-6.4%  Diabetes:              >6.4%  Glycemic control for   <7.0% adults with diabetes     CBG: Recent Labs  Lab 08/20/23 1516 08/20/23 1913 08/20/23 2311 08/21/23 0312 08/21/23 0711  GLUCAP 206* 212* 132* 108* 105*    Review of Systems:   Denies any chest, pain, fever or chills.   Past Medical History:  Steve Thompson,  has a past medical history of Arthritis, Hemorrhoids, Hypercalcemia, Hyperthyroidism, and Peripheral neuropathy.   Surgical History:   Past Surgical History:  Procedure Laterality Date   HEMORRHOID SURGERY     PARATHYROIDECTOMY  10/13/10     Social History:   reports that Steve Thompson has quit smoking. Steve Thompson has never used smokeless tobacco. Steve Thompson reports that Steve Thompson does not drink alcohol  and does not use drugs.   Family History:  His family history includes Cancer in his sister.   Allergies No Known Allergies   Home Medications  Prior to Admission medications   Medication Sig Start Date End Date Taking? Authorizing Provider  alfuzosin (UROXATRAL) 10 MG 24 hr tablet Take 10 mg by mouth at bedtime.   Yes [provider]  clopidogrel  (PLAVIX ) 75 MG tablet Take 1 tablet (75 mg total) by mouth daily. 07/09/23  Yes Odell Celinda Balo, MD  finasteride  (PROSCAR ) 5 MG tablet Take 5 mg by mouth daily.   Yes [provider]  gabapentin  (NEURONTIN ) 300 MG capsule Take 1 capsule (300 mg total) by mouth in the morning AND 2 capsules (600 mg total) at bedtime. Patient taking differently: Take 3 capsules (900mg ) by mouth at bedtime 07/09/23  Yes Odell Celinda Balo, MD   levothyroxine  (SYNTHROID ) 100 MCG tablet Take 1 tablet (100 mcg total) by mouth daily. 02/28/23  Yes Dennise Lavada POUR, MD  loratadine  (CLARITIN ) 10 MG tablet Take 10 mg by mouth daily.   Yes [provider]  pyridostigmine  (MESTINON ) 60 MG tablet 0.5 to one tab up to 3 times a day as needed. Patient taking differently: Take 60 mg by mouth every evening. 0.5 to one tab up to 3 times a day as needed. 07/16/23  Yes Onita Duos, MD  rosuvastatin  (CRESTOR ) 20 MG tablet Take 1 tablet (20 mg total) by mouth daily. 02/28/23  Yes Dennise Lavada POUR, MD  tamsulosin  (FLOMAX ) 0.4 MG CAPS capsule Take 2 capsules (0.8 mg total) by mouth daily. Patient not taking: Reported on 08/20/2023 07/09/23   Odell Celinda Balo, MD     Critical care time: One hour    Drue Grow MD [facility] Internal Medicine Program - PGY-2 08/21/2023, 10:44 AM Pager# [phone]

## 2023-08-21 NOTE — Progress Notes (Signed)
 [facility] 2M03 [facility]  Hospice hospital liaison note   Received request from [facility] and PMT for patient and family interest in hospice inpatient unit. Met with patient at bedside today and attempted to speak with daughter and wife by phone.    Chart reviewed and at this time patient does not meet criteria for hospice inpatient unit.    He is appropriate for hospice services in the home or LTC facility. Per discussion, we will plan to reassess for inpatient unit appropriateness tomorrow and attend family meeting with PMT.    Thank you for the opportunity to participate in this patient's care.    Eleanor Nail, LPN Hospice hospital liaison [phone]

## 2023-08-21 NOTE — Progress Notes (Signed)
 This chaplain responded to PMT NP-Erwin's consult for spiritual care for the Pt. and Pt. wife. The chaplain reviewed the Pt. Epic chart before the visit. The chaplain understands the Pt. is expressing interest in inpatient hospice.  The Pt is awake at the time of the visit with two friends visiting. The chaplain understands the Pt. has eaten some of his lunch and is anticipating a move to [facility] in the near future.  The Pt. accepted the chaplain's invitation to continue the conversation with his friends and have the chaplain revisit on Wednesday.  Chaplain Leeroy Hummer [phone]

## 2023-08-21 NOTE — Plan of Care (Signed)
  Problem: Coping: Goal: Ability to adjust to condition or change in health will improve Outcome: Progressing   Problem: Fluid Volume: Goal: Ability to maintain a balanced intake and output will improve Outcome: Progressing   Problem: Health Behavior/Discharge Planning: Goal: Ability to identify and utilize available resources and services will improve Outcome: Progressing Goal: Ability to manage health-related needs will improve Outcome: Progressing   Problem: Metabolic: Goal: Ability to maintain appropriate glucose levels will improve Outcome: Progressing   Problem: Skin Integrity: Goal: Risk for impaired skin integrity will decrease Outcome: Progressing   Problem: Tissue Perfusion: Goal: Adequacy of tissue perfusion will improve Outcome: Progressing   Problem: Health Behavior/Discharge Planning: Goal: Ability to manage health-related needs will improve Outcome: Progressing   Problem: Clinical Measurements: Goal: Ability to maintain clinical measurements within normal limits will improve Outcome: Progressing Goal: Will remain free from infection Outcome: Progressing Goal: Diagnostic test results will improve Outcome: Progressing Goal: Respiratory complications will improve Outcome: Progressing Goal: Cardiovascular complication will be avoided Outcome: Progressing   Problem: Elimination: Goal: Will not experience complications related to bowel motility Outcome: Progressing Goal: Will not experience complications related to urinary retention Outcome: Progressing   Problem: Skin Integrity: Goal: Risk for impaired skin integrity will decrease Outcome: Progressing

## 2023-08-21 NOTE — Progress Notes (Signed)
 NAME:  Steve Thompson, MRN:  [id number], DOB:  Feb 17, 1931, LOS: 1 ADMISSION DATE:  08/20/2023, CONSULTATION DATE:  08/20/23 REFERRING MD:  MD Rancour, CHIEF COMPLAINT:  Hematuria    History of Present Illness:  89 year old male with significant past medical history of hypertension, hemorrhoids, hypothyroidism, peripheral neuropathy, multiple falls, ocular myasthenia gravis, arthritis, BPH, CVA 02/2023 on plavix  for stroke prevention, recent hospitalization from 6/11-6/23/25 for generalized weakness/poor p.o. intake and found to have AKI secondary to BPH with urinary retention and ultimately discharged with Foley catheter with urology outpatient follow-up, who presents to [facility] ED by EMS due to bleeding from ureteral meatus secondary from traumatic Foley catheter removal (unclear of accidental/purpose intent).   Pertinent  Medical History  Further history from daughter. Patient was complaining of burning pain in his lower abdomen and around foley prior to his presentation. He did not have any other complaints and was at baseline. Daughter was worried he fell but when she came and saw him, he was on the ground.  Discussed goals of care with family. Patient does not want any invasive procedures including central lines and intubation. Daughter mentions that he does not have histroy of atrial fibrillation or flutter but she states that sometimes his heart rate can act up  Significant Hospital Events: Including procedures, antibiotic start and stop dates in addition to other pertinent events   Admitted to the ICU  8/4 : Added Vaso to levo, added amiodarone  for better control of heart rate. Critically ill but stable for now  8/5 doing better, off pressors  Interim History / Subjective:  Patient is unable to provide any more information   Objective    Blood pressure 117/68, pulse 77, temperature 97.7 F (36.5 C), temperature source Oral, resp. rate 19, height 5' 9.5 (1.765 m), weight 90.8 kg, SpO2  91%.        Intake/Output Summary (Last 24 hours) at 08/21/2023 0757 Last data filed at 08/21/2023 0700 Gross per 24 hour  Intake 3595.86 ml  Output 1255 ml  Net 2340.86 ml   Filed Weights   08/20/23 0215 08/21/23 0030  Weight: 77.5 kg 90.8 kg    Examination: Physical Exam Constitutional:      Appearance: He is ill-appearing and toxic-appearing.  HENT:     Nose: Nose normal.     Mouth/Throat:     Mouth: Mucous membranes are dry.  Eyes:     Extraocular Movements: Extraocular movements intact.     Pupils: Pupils are equal, round, and reactive to light.  Cardiovascular:     Rate and Rhythm: Tachycardia present. Rhythm irregular.     Pulses: Normal pulses.     Heart sounds: No murmur heard. Pulmonary:     Breath sounds: Rhonchi present.  Abdominal:     General: Abdomen is flat.     Palpations: Abdomen is soft.  Musculoskeletal:        General: No swelling or tenderness.  Skin:    General: Skin is warm.     Capillary Refill: Capillary refill takes 2 to 3 seconds.  Neurological:     Mental Status: He is disoriented.      Resolved problem list   Assessment and Plan  89 Y/O M presenting with shock likely septic shock like due to urinary source. Patient complaining of burning ureteral pain prior to presentation. Found ill at home and presented with shock. He is less likely to have a hemorrhagic shock at this point as he was adequatly  resuscitated  and has a stable hb. His CT Abdomen shows extensive bladder wall thickening/mucosal hyperenhancement with bladder diverticula-possibly related to cystitis. His course is also complicated by atrial flutter with 2:1  GOC Discussed with family at bedside critical illess and goals of care. Patient and family would like to continue current treatment with pressors and antibiotics. No plan for invasive interventions. After discussion with palliative care family would like to proceed with hospice care and minimize any medical interventions.  Patient is planned to transfer to the medical ward today.     Cardiovascular  #Septic Shock  -Zosyn  switched to ampicillin   - Vanco stopped as his MRSA screen was negative  - Stop stress dose steroids  - Stop maintenance fluids  - Patient and family would like to avoid invasive procedures including central lines. Currently getting Vaso and Levo through peripheral lines  - Urology consult, appreciate recs   #New onset Atrial flutter  Holding any AC other than DVT ppx  at this time due to hematuria and concern for hemorrhagic shock  - Started on amiodarone  infusion without bolus with good effect in better heart rate control. Now in sinus rhythm  - Echocardiogram EF 20%. Stress cardiomyopathy  - Continue cardiac tele   #New onset acute systolic congestive heart failure  #Type II NSTEMI  Likely in the setting of stress cardiomapthy and RVR. Rate better controlled with amiodarone . Continue to trend troponin and EKG - Heart failure consult  - Lasix  40mg  today. Plan for goal net negative 1-2L today  - Check BMP later today  - Resume plavix  and Crestor     #Pericardial effusion (small)   #HLD No intervention   #Chronic Hypertension Currently hypotensive on pressors. Hold any BP meds. Not on any at home anyway    Pulmonary  #Acute hypoxic respiratory failure  #aspiration pneumonitis vs pneumonia  #Pulmonary nodules  Currently on 3L oxygen.  Switch [place] to nasal cannula  CT chest with scattered foci of ground glass opacities some appear on prior scans as well. Subsolid nodule in the LLL measuring 2cm slightly increased in size with some concern for malignancy. A second nodule is seen in the right upper lobe. Given critical illness, nodule management can be deferred at this time after discussion with family.    Renal/Urology  #Hematuria with traumatic foley removal #Complicated UTI #Urine retention #BPH Reviewed CT abdomen with evidence of cystitis. Less likely  colitis. Bladder scan today with urine retention (650cc) Reached out to urology for further recommendations and for placement of foley catheter  Continue to hold prazosin in the setting of hypotension  Can resume finasteride  at a later time   ID E fecalis bacteremia likely from urinary source  -Switch Zosyn  to ampicillin  for 6 more days.     Heme/Onc #Anemia  #Pulmonary nodules  Continue SCDs Start DVT ppx with heparin    Endo  #Hyperglycemia  #Hypokalemia  #Hypomagnesemia  ICU phase 1  Glycemia protocol  (SSI) Repleting K   Neuro  #Toxic metabolic encephalopathy  #Ocular myasthema  #Hx of CVA  Likely in the setting of sepsis. Supportive care and monitoring  Hold off on Mag repletion      Scheduled Meds:  sodium chloride    Intravenous Once   Chlorhexidine  Gluconate Cloth  6 each Topical Daily   clopidogrel   75 mg Oral Daily   finasteride   5 mg Oral Daily   heparin  injection (subcutaneous)  5,000 Units Subcutaneous Q8H   hydrocortisone  sod succinate (SOLU-CORTEF ) inj  100 mg Intravenous Q12H  insulin  aspart  2-6 Units Subcutaneous Q4H   levothyroxine   100 mcg Oral Daily   rosuvastatin   20 mg Oral Daily   Continuous Infusions:  amiodarone  30 mg/hr (08/21/23 0700)   norepinephrine  (LEVOPHED ) Adult infusion Stopped (08/21/23 0215)   piperacillin -tazobactam (ZOSYN )  IV Stopped (08/21/23 0426)   vasopressin  Stopped (08/21/23 0631)   PRN Meds:.docusate sodium , polyethylene glycol    Best Practice (right click and Reselect all SmartList Selections daily)   Diet/type: NPO DVT prophylaxis prophylactic heparin   Pressure ulcer(s): N/A GI prophylaxis: N/A Lines: N/A Foley:  N/A Code Status:  DNR/DNI Last date of multidisciplinary goals of care discussion [08/20/23]  Labs   CBC: Recent Labs  Lab 08/20/23 0218 08/20/23 0229 08/20/23 0235 08/20/23 0620 08/21/23 0433  WBC 3.9*  --   --  19.5* 38.7*  HGB 12.5* 12.9* 12.6* 12.9* 11.4*  HCT 39.6 38.0* 37.0*  38.5* 33.9*  MCV 93.6  --   --  91.4 88.7  PLT 228  --   --  189 172    Basic Metabolic Panel: Recent Labs  Lab 08/20/23 0218 08/20/23 0229 08/20/23 0235 08/20/23 0620 08/20/23 0914 08/20/23 1604 08/21/23 0433  NA 139 140 140 141 139 135 134*  K 3.1* 3.1* 3.0* 2.6* 3.2* 3.6 4.4  CL 104 102  --  108 107 105 105  CO2 20*  --   --  20* 19* 18* 20*  GLUCOSE 209* 208*  --  134* 163* 216* 102*  BUN 14 15  --  16 18 22  25*  CREATININE 1.23 1.10  --  1.17 1.26* 1.33* 1.13  CALCIUM  9.0  --   --  8.3* 8.0* 8.1* 8.3*  MG  --   --   --  1.3*  --   --  1.5*  PHOS  --   --   --  2.0*  --   --  3.5   GFR: Estimated Creatinine Clearance: 48.9 mL/min (by C-G formula based on SCr of 1.13 mg/dL). Recent Labs  Lab 08/20/23 0218 08/20/23 0231 08/20/23 0620 08/20/23 0914 08/21/23 0433  PROCALCITON  --   --  33.39  --   --   WBC 3.9*  --  19.5*  --  38.7*  LATICACIDVEN  --  5.0* 4.8* 3.3*  --     Liver Function Tests: Recent Labs  Lab 08/20/23 0218  AST 23  ALT 13  ALKPHOS 75  BILITOT 1.5*  PROT 6.1*  ALBUMIN 3.2*   Recent Labs  Lab 08/20/23 0218  LIPASE 22   No results for input(s): AMMONIA in the last 168 hours.  ABG    Component Value Date/Time   HCO3 22.6 08/20/2023 0235   TCO2 24 08/20/2023 0235   ACIDBASEDEF 3.0 (H) 08/20/2023 0235   O2SAT 98 08/20/2023 0235     Coagulation Profile: Recent Labs  Lab 08/20/23 0218  INR 1.2    Cardiac Enzymes: No results for input(s): CKTOTAL, CKMB, CKMBINDEX, TROPONINI in the last 168 hours.  HbA1C: Hgb A1c MFr Bld  Date/Time Value Ref Range Status  08/20/2023 06:20 AM 5.8 (H) 4.8 - 5.6 % Final    Comment:    (NOTE)         Prediabetes: 5.7 - 6.4         Diabetes: >6.4         Glycemic control for adults with diabetes: <7.0   02/25/2023 02:20 PM 5.7 (H) 4.8 - 5.6 % Final    Comment:    (  NOTE) Pre diabetes:          5.7%-6.4%  Diabetes:              >6.4%  Glycemic control for   <7.0% adults with  diabetes     CBG: Recent Labs  Lab 08/20/23 1516 08/20/23 1913 08/20/23 2311 08/21/23 0312 08/21/23 0711  GLUCAP 206* 212* 132* 108* 105*    Review of Systems:   Patient disoriented and unable to perform a thorough ROS   Past Medical History:  He,  has a past medical history of Arthritis, Hemorrhoids, Hypercalcemia, Hyperthyroidism, and Peripheral neuropathy.   Surgical History:   Past Surgical History:  Procedure Laterality Date   HEMORRHOID SURGERY     PARATHYROIDECTOMY  10/13/10     Social History:   reports that he has quit smoking. He has never used smokeless tobacco. He reports that he does not drink alcohol  and does not use drugs.   Family History:  His family history includes Cancer in his sister.   Allergies No Known Allergies   Home Medications  Prior to Admission medications   Medication Sig Start Date End Date Taking? Authorizing Provider  alfuzosin (UROXATRAL) 10 MG 24 hr tablet Take 10 mg by mouth at bedtime.   Yes [provider]  clopidogrel  (PLAVIX ) 75 MG tablet Take 1 tablet (75 mg total) by mouth daily. 07/09/23  Yes Odell Celinda Balo, MD  finasteride  (PROSCAR ) 5 MG tablet Take 5 mg by mouth daily.   Yes [provider]  gabapentin  (NEURONTIN ) 300 MG capsule Take 1 capsule (300 mg total) by mouth in the morning AND 2 capsules (600 mg total) at bedtime. Patient taking differently: Take 3 capsules (900mg ) by mouth at bedtime 07/09/23  Yes Odell Celinda Balo, MD  levothyroxine  (SYNTHROID ) 100 MCG tablet Take 1 tablet (100 mcg total) by mouth daily. 02/28/23  Yes Dennise Lavada POUR, MD  loratadine  (CLARITIN ) 10 MG tablet Take 10 mg by mouth daily.   Yes [provider]  pyridostigmine  (MESTINON ) 60 MG tablet 0.5 to one tab up to 3 times a day as needed. Patient taking differently: Take 60 mg by mouth every evening. 0.5 to one tab up to 3 times a day as needed. 07/16/23  Yes Onita Duos, MD  rosuvastatin  (CRESTOR ) 20 MG tablet  Take 1 tablet (20 mg total) by mouth daily. 02/28/23  Yes Dennise Lavada POUR, MD  tamsulosin  (FLOMAX ) 0.4 MG CAPS capsule Take 2 capsules (0.8 mg total) by mouth daily. Patient not taking: Reported on 08/20/2023 07/09/23   Odell Celinda Balo, MD     Critical care time: 31    I spent caring for this patient today, including preparing to see the patient, obtaining a medical history , reviewing a separately obtained history, performing a medically appropriate examination and/or evaluation, counseling and educating the patient/family/caregiver, ordering medications, tests, or procedures, referring and communicating with other health care professionals (not separately reported), and documenting clinical information in the electronic health record  The patient is critically ill due to Septic shock requiring vasopressors, cardiovascular collpase with a life threatening arrhythmia requiring amiodarone  infusion .  Critical care was necessary to treat or prevent imminent or life-threatening deterioration. Critical care time was spent by me on the following activities: development of a treatment plan with the patient and/or surrogate as well as nursing, discussions with consultants, evaluation of the patient's response to treatment, examination of the patient, obtaining a history from the patient or surrogate, ordering and performing  treatments and interventions, ordering and review of laboratory studies, ordering and review of radiographic studies, review of telemetry data including pulse oximetry, re-evaluation of patient's condition and participation in multidisciplinary rounds.    Zola LOISE Herter, MD  Pulmonary Critical Care 08/21/2023 7:57 AM

## 2023-08-21 NOTE — TOC Initial Note (Signed)
 Transition of Care [facility]) - Initial/Assessment Note    Patient Details  Name: Steve Thompson MRN: [id number] Date of Birth: [birth year]  Transition of Care [facility]) CM/SW Contact:    Lauraine FORBES Saa, LCSW Phone Number: 08/21/2023, 11:37 AM  Clinical Narrative:                  11:37 AM Per Palliative Care NP, patient and patient's family expressed interest in transitioning to comfort care and discharging to residential hospice (prefers [facility] due to location). [facility] RN Liaison was consulted. TOC will continue to follow and be available to assist.  Expected Discharge Plan: Hospice Medical Facility Barriers to Discharge: Continued Medical Work up, Hospice Bed not available   Patient Goals and CMS Choice Patient states their goals for this hospitalization and ongoing recovery are:: to discharge to [facility]          Expected Discharge Plan and Services In-house Referral: Clinical Social Work   Post Acute Care Choice: Hospice Living arrangements for the past 2 months: Single Family Home                                      Prior Living Arrangements/Services Living arrangements for the past 2 months: Single Family Home Lives with:: Spouse Patient language and need for interpreter reviewed:: Yes              Criminal Activity/Legal Involvement Pertinent to Current Situation/Hospitalization: No - Comment as needed  Activities of Daily Living      Permission Sought/Granted Permission sought to share information with : Family Supports, Oceanographer granted to share information with : No (Contact information on chart)  Share Information with NAME: Dequan Kindred  Permission granted to share info w AGENCY: Psychologist, sport and exercise  Permission granted to share info w Relationship: Spouse  Permission granted to share info w Contact Information: [phone]  Emotional Assessment       Orientation: : Oriented to Self, Oriented to  Place, Oriented to  Time, Oriented to Situation Alcohol  / Substance Use: Not Applicable Psych Involvement: No (comment)  Admission diagnosis:  Hemorrhagic shock (HCC) [R57.8] Shock (HCC) [R57.9] Septic shock (HCC) [A41.9, R65.21] Sepsis (HCC) [A41.9] Patient Active Problem List   Diagnosis Date Noted   Enterococcal bacteremia 08/21/2023   Septic shock (HCC) 08/20/2023   Palliative care by specialist 08/20/2023   Goals of care, counseling/discussion 08/20/2023   Acute kidney injury superimposed on chronic kidney disease (HCC) 06/27/2023   Head contusion 06/27/2023   SIRS (systemic inflammatory response syndrome) (HCC) 06/27/2023   Fall at home, initial encounter 06/27/2023   History of CVA (cerebrovascular accident) 06/27/2023   Generalized weakness 06/27/2023   Lung nodule 06/27/2023   Compression fracture of body of thoracic vertebra (HCC) 06/27/2023   Hyperlipidemia 06/27/2023   TIA (transient ischemic attack) 02/25/2023   Diplopia 01/31/2022   Paresthesia 01/31/2022   Cognitive impairment 01/31/2022   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   Hypothyroidism, 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 [place] [address] Phone: [phone] Fax: [phone]  [facility] [address] [place] [address] Phone: [phone] Fax: [phone]     Social Drivers of Health (SDOH) Social History: SDOH Screenings  Food Insecurity: No Food Insecurity (08/21/2023)  Housing: Low Risk  (08/21/2023)  Transportation Needs: No Transportation Needs (08/21/2023)  Utilities: Not At Risk (08/21/2023)  Social Connections: Moderately Integrated (08/21/2023)  Tobacco Use: Medium Risk (06/27/2023)   SDOH Interventions:     Readmission Risk Interventions     No data  to display

## 2023-08-21 NOTE — Progress Notes (Signed)
 Speech Language Pathology Treatment: Dysphagia  Patient Details Name: Steve Thompson MRN: [id number] DOB: 1931/08/19 Today's Date: 08/21/2023 Time: 8497-8487 SLP Time Calculation (min) (ACUTE ONLY): 10 min  Assessment / Plan / Recommendation Clinical Impression  Patient seen by SLP for skilled session focused on dysphagia goals. Patient was awake, alert and multiple family members in room. Session focused primarily on discussion with patient's daughter regarding his swallow and PO intake. She indicated that patient has not eaten much overall. She shared that she has 30 years experience working in palliative care. When SLP discussed patient's toleration of PO textures, daughter indicated that as majority of family is on board with proceeding with hospice/comfort care, she would like patient to be able to eat what he wants. SLP inspected patient's oral mucosa which appeared slightly dry but no oral residuals or secretions observed. He was receptive to taking some sips of water (2-3 sips total) before saying that's it. No overt s/s aspiration and swallow initiation appeared timely. Although patient's spouse/POA has not made the decision yet for comfort/hospice care, SLP is recommending to advance diet from dys 3 (mechanical soft) solids to regular solids and continue with assistance and full supervision with meals. SLP recommended to patient's daughter to focus on PO's that have some moisture(fruit, yogurt, etc) as opposed to dry foods (breads, crackers, etc). SLP to s/o at this time.   HPI HPI: Patient is a 89 year old male with significant past medical history of hypertension, hemorrhoids, hypothyroidism, peripheral neuropathy, multiple falls, ocular myasthenia gravis, arthritis, BPH, CVA 02/2023 on plavix  for stroke prevention, recent hospitalization from 6/11-6/23/25 for generalized weakness/poor p.o. intake and found to have AKI secondary to BPH with urinary retention and ultimately discharged with Foley  catheter with urology outpatient follow-up, who presents to [facility] ED by EMS due to bleeding from ureteral meatus secondary from traumatic Foley catheter removal (unclear of accidental/purpose intent).      SLP Plan  Discharge SLP treatment due to (comment);All goals met          Recommendations  Diet recommendations: Regular;Thin liquid Liquids provided via: Cup;Straw Medication Administration: Whole meds with liquid Supervision: Trained caregiver to feed patient;Full supervision/cueing for compensatory strategies Compensations: Slow rate;Small sips/bites;Follow solids with liquid                  Oral care BID   Frequent or constant Supervision/Assistance Dysphagia, oral phase (R13.11)     Discharge SLP treatment due to (comment);All goals met     Norleen IVAR Blase, MA, CCC-SLP Speech Therapy

## 2023-08-21 NOTE — Progress Notes (Signed)
 Palliative:  HPI: 89 y.o. male  with past medical history of  hypertension, hemorrhoids, hypothyroidism, peripheral neuropathy, multiple falls, ocular myasthenia gravis, arthritis, BPH, CVA 02/2023 on plavix  for stroke prevention, recent hospitalization from 6/11-6/23/25 for generalized weakness/poor p.o. intake and found to have AKI secondary to BPH with urinary retention  admitted on 08/20/2023 with bleeding fro ureteral meatus secondary from traumatic foley catheter removal of unclear cause. Currently being treated for septic shock, likely urinary in etiology. Of note, he was seen by PMT back in June for goals of care discussion.  At that time, patient and family had decided to continue with supportive care, and has changed his code status from full code to DNR/DNI. PMT has been consulted to assist with goals of care conversation. Patient/Family face treatment option decisions, advanced directive decisions and anticipatory care needs.   I met today with Steve Thompson wife (her daughter and son-in-law), 2 daughters, son and daughter-in-law. We had a long discussion regarding Steve Thompson improvement from yesterday to today. We discussed that we are in a much different situation. Multiple family members with multiple concerns regarding his condition and decline since February. Steve Thompson and his wife, Steve Thompson, have struggled with his care over the past months. She has recently hired a caregiver for assistance twice a week. All family are concerned about path forward and how to best support Steve Thompson and Steve Thompson. We reviewed options moving forward. They are not really interested in SNF rehab if ongoing improvement as they had a bad experience recently. They are not able to increase care at home and family do not live nearby to assist. Daughter is a Therapist, music and asks about hospice facility. He is unlikely to qualify for hospice facility currently. There was discussion of his eligibility if he were to transition to full  comfort measures. We did discuss with transition to full comfort care including stopping antibiotics that he may be eligible for hospice facility and time may be limited.   There was extensive discussion of full comfort care and what this means. There is discussion of timing and that the longer we treat him the more likely he is not going to quality for hospice placement. We further defined current goals of care. All clear about decision to NOT escalate care. No escalation back to vasopressor/ICU supports if he declines further. They all wish to hold off on pursuing work up and meeting with cardiology/heart failure for further recommendations or work up as they are leaning towards more comfort focused care. They do not desire invasive work up. He has declined work up for lung nodules previously as well. They wish to move out of ICU which is appropriate anyway. We will coordinate and speak with hospice and re-meet to discuss further in the morning. Steve Thompson shares that she is interested in discussing with hospice liaison potential for hospice facility but wishes to continue antibiotics at this time. She does agree with discontinuing if he were able to move to hospice facility. Steve Thompson agrees with plan although unclear how much he truly understands.   I received f/u phone call. I will meet at Steve Thompson's bedside along with wife and her daughter (and hospice liaison if able) at 50 am prior to meeting with entire family at 55 am. Steve Thompson wishes to discuss goals of care further with Steve Thompson. I will continue conversation and continue to explore best options forward tomorrow.   Exam: Alert. Pleasant. Poor insight and poor recall. Poor understanding of situation -  refers to wife Steve Thompson for decisions. Pleasantly confused. No distress. HR tachy, irregular. Breathing regular, unlabored, rhonchi. Abd soft.   Plan: - DNR/DNI - No escalation of care - No plans for return to ICU or resuming vasopressors -  Considering hospice options - Hold on consultation with cardiology/heart failure with no further work up at this time - Continue antibiotic therapy and current conservative care at this time  90 min  Bernarda Kitty, NP Palliative Medicine Team Pager [phone] (Please see [email or web address] for schedule) Team Phone [phone]

## 2023-08-22 DIAGNOSIS — Z515 Encounter for palliative care: Secondary | ICD-10-CM | POA: Diagnosis not present

## 2023-08-22 DIAGNOSIS — B952 Enterococcus as the cause of diseases classified elsewhere: Secondary | ICD-10-CM | POA: Diagnosis not present

## 2023-08-22 DIAGNOSIS — R7881 Bacteremia: Secondary | ICD-10-CM

## 2023-08-22 DIAGNOSIS — Z7189 Other specified counseling: Secondary | ICD-10-CM | POA: Diagnosis not present

## 2023-08-22 LAB — CULTURE, BLOOD (ROUTINE X 2)

## 2023-08-22 LAB — GLUCOSE, CAPILLARY
Glucose-Capillary: 137 mg/dL — ABNORMAL HIGH (ref 70–99)
Glucose-Capillary: 87 mg/dL (ref 70–99)
Glucose-Capillary: 106 mg/dL — ABNORMAL HIGH (ref 70–99)
Glucose-Capillary: 108 mg/dL — ABNORMAL HIGH (ref 70–99)

## 2023-08-22 LAB — LEGIONELLA PNEUMOPHILA SEROGP 1 UR AG: L. pneumophila Serogp 1 Ur Ag: NEGATIVE

## 2023-08-22 MED ORDER — PHENAZOPYRIDINE HCL 100 MG PO TABS: 100.0000 mg | ORAL_TABLET | Freq: Three times a day (TID) | ORAL | Status: DC | PRN

## 2023-08-22 MED ORDER — GLYCOPYRROLATE 0.2 MG/ML IJ SOLN: 0.2000 mg | INTRAMUSCULAR | Status: DC | PRN

## 2023-08-22 MED ORDER — ONDANSETRON HCL 4 MG/2ML IJ SOLN: 4.0000 mg | Freq: Four times a day (QID) | INTRAMUSCULAR | Status: DC | PRN

## 2023-08-22 MED ORDER — MORPHINE SULFATE (CONCENTRATE) 10 MG /0.5 ML PO SOLN: 5.0000 mg | ORAL | Status: DC | PRN

## 2023-08-22 MED ORDER — ONDANSETRON 4 MG PO TBDP
4.0000 mg | ORAL_TABLET | Freq: Four times a day (QID) | ORAL | Status: DC | PRN
Start: 2023-08-22 — End: 2023-08-23

## 2023-08-22 MED ORDER — GLYCOPYRROLATE 1 MG PO TABS: 1.0000 mg | ORAL_TABLET | ORAL | Status: DC | PRN

## 2023-08-22 MED ORDER — HALOPERIDOL LACTATE 5 MG/ML IJ SOLN: 0.5000 mg | INTRAMUSCULAR | Status: DC | PRN

## 2023-08-22 MED ORDER — POLYVINYL ALCOHOL 1.4 % OP SOLN: 1.0000 [drp] | Freq: Four times a day (QID) | OPHTHALMIC | Status: DC | PRN

## 2023-08-22 MED ORDER — RISAQUAD PO CAPS
2.0000 | ORAL_CAPSULE | Freq: Three times a day (TID) | ORAL | Status: DC
  Administered 2023-08-22 (×2): 2 via ORAL
  Filled 2023-08-22 (×2): qty 2

## 2023-08-22 MED ORDER — QUETIAPINE 12.5 MG HALF TABLET
25.0000 mg | ORAL_TABLET | Freq: Every day | ORAL | Status: DC
  Administered 2023-08-22: 25 mg via ORAL
  Filled 2023-08-22: qty 2

## 2023-08-22 MED ORDER — MORPHINE SULFATE (PF) 2 MG/ML IV SOLN: 1.0000 mg | INTRAVENOUS | Status: DC | PRN

## 2023-08-22 MED ORDER — HALOPERIDOL 0.5 MG PO TABS
0.5000 mg | ORAL_TABLET | ORAL | Status: DC | PRN
Start: 2023-08-22 — End: 2023-08-23

## 2023-08-22 MED ORDER — BIOTENE DRY MOUTH MT LIQD
15.0000 mL | OROMUCOSAL | Status: DC | PRN
Start: 2023-08-22 — End: 2023-08-23

## 2023-08-22 MED ORDER — HALOPERIDOL LACTATE 2 MG/ML PO CONC: 0.5000 mg | ORAL | Status: DC | PRN

## 2023-08-22 NOTE — Progress Notes (Signed)
 PROGRESS NOTE    Steve Thompson  FMW:[id number] DOB: 1931/10/28 DOA: 08/20/2023 PCP: Okey Carlin Redbird, MD    Brief Narrative:  Patient is a 89 year old male with a history remarkable for hypertension hemorrhoids, hypertension and peripheral neuropathy, multiple falls, myasthenia gravis, BPH, CVA in February 2025 on Plavix  who presented via EMS due to concern for bleeding from the urethral meatus secondary to trauma from foley removal.    Assessment and Plan: Undifferentiated Shock  Lactic acidosis Enterococcus bacteremia  -Blood cultures grew enterococcus fecalis  -transition to comfort care   Hypertension Atrial flutter new onset Stress Cardiomyopathy - transition to comfort care   BPH  Urinary Retention Hematuria - Urology saw and placed his Foley, has not had any issues with that.    Hyperglycemia  Hypokalemia Hx of CVA Ocular myasthenia gravis Hyperlipidemia Peripheral neuropathy Frequent falls noted on previous admission   GOC -transition to comfort care   DVT prophylaxis:     Code Status: Do not attempt resuscitation (DNR) - Comfort care Family Communication:   Disposition Plan:  Level of care: Telemetry Medical Status is: Inpatient     Consultants:  Palliative care  Subjective: Family meeting today  Objective: Vitals:   08/22/23 0150 08/22/23 0314 08/22/23 0346 08/22/23 0737  BP: 127/70 (!) 147/75  (!) 145/81  Pulse: 97 95  94  Resp:  19  18  Temp: 99.3 F (37.4 C) (!) 97.4 F (36.3 C)  98.9 F (37.2 C)  TempSrc: Oral Oral  Oral  SpO2: 91% 92%  90%  Weight:   79.3 kg   Height:        Intake/Output Summary (Last 24 hours) at 08/22/2023 1204 Last data filed at 08/22/2023 9090 Gross per 24 hour  Intake 456 ml  Output 2100 ml  Net -1644 ml   Filed Weights   08/20/23 0215 08/21/23 0030 08/22/23 0346  Weight: 77.5 kg 90.8 kg 79.3 kg    Examination:   General: Appearance:     Overweight male in no acute distress     Lungs:       respirations unlabored  Heart:    Normal heart rate.    MS:   All extremities are intact.    Neurologic:   Awake, alert       Data Reviewed: I have personally reviewed following labs and imaging studies  CBC: Recent Labs  Lab 08/20/23 0218 08/20/23 0229 08/20/23 0235 08/20/23 0620 08/21/23 0433  WBC 3.9*  --   --  19.5* 38.7*  HGB 12.5* 12.9* 12.6* 12.9* 11.4*  HCT 39.6 38.0* 37.0* 38.5* 33.9*  MCV 93.6  --   --  91.4 88.7  PLT 228  --   --  189 172   Basic Metabolic Panel: Recent Labs  Lab 08/20/23 0620 08/20/23 0914 08/20/23 1604 08/21/23 0433 08/21/23 1410  NA 141 139 135 134* 137  K 2.6* 3.2* 3.6 4.4 3.8  CL 108 107 105 105 101  CO2 20* 19* 18* 20* 26  GLUCOSE 134* 163* 216* 102* 116*  BUN 16 18 22  25* 24*  CREATININE 1.17 1.26* 1.33* 1.13 1.29*  CALCIUM  8.3* 8.0* 8.1* 8.3* 8.9  MG 1.3*  --   --  1.5* 1.7  PHOS 2.0*  --   --  3.5 3.5   GFR: Estimated Creatinine Clearance: 38.7 mL/min (A) (by C-G formula based on SCr of 1.29 mg/dL (H)). Liver Function Tests: Recent Labs  Lab 08/20/23 0218 08/21/23 1410  AST 23  --  ALT 13  --   ALKPHOS 75  --   BILITOT 1.5*  --   PROT 6.1*  --   ALBUMIN 3.2* 2.8*   Recent Labs  Lab 08/20/23 0218  LIPASE 22   No results for input(s): AMMONIA in the last 168 hours. Coagulation Profile: Recent Labs  Lab 08/20/23 0218  INR 1.2   Cardiac Enzymes: No results for input(s): CKTOTAL, CKMB, CKMBINDEX, TROPONINI in the last 168 hours. BNP (last 3 results) No results for input(s): PROBNP in the last 8760 hours. HbA1C: Recent Labs    08/20/23 0620  HGBA1C 5.8*   CBG: Recent Labs  Lab 08/21/23 1714 08/21/23 2124 08/21/23 2348 08/22/23 0320 08/22/23 0735  GLUCAP 103* 86 92 87 106*   Lipid Profile: No results for input(s): CHOL, HDL, LDLCALC, TRIG, CHOLHDL, LDLDIRECT in the last 72 hours. Thyroid  Function Tests: Recent Labs    08/20/23 0620  TSH 0.880   Anemia Panel: No  results for input(s): VITAMINB12, FOLATE, FERRITIN, TIBC, IRON, RETICCTPCT in the last 72 hours. Sepsis Labs: Recent Labs  Lab 08/20/23 0231 08/20/23 0620 08/20/23 0914  PROCALCITON  --  33.39  --   LATICACIDVEN 5.0* 4.8* 3.3*    Recent Results (from the past 240 hours)  Respiratory (~20 pathogens) panel by PCR     Status: None   Collection Time: 08/20/23  5:03 AM   Specimen: Anterior Nasal Swab; Respiratory  Result Value Ref Range Status   Adenovirus NOT DETECTED NOT DETECTED Final   Coronavirus 229E NOT DETECTED NOT DETECTED Final    Comment: (NOTE) The Coronavirus on the Respiratory Panel, DOES NOT test for the novel  Coronavirus (2019 nCoV)    Coronavirus HKU1 NOT DETECTED NOT DETECTED Final   Coronavirus NL63 NOT DETECTED NOT DETECTED Final   Coronavirus OC43 NOT DETECTED NOT DETECTED Final   Metapneumovirus NOT DETECTED NOT DETECTED Final   Rhinovirus / Enterovirus NOT DETECTED NOT DETECTED Final   Influenza A NOT DETECTED NOT DETECTED Final   Influenza B NOT DETECTED NOT DETECTED Final   Parainfluenza Virus 1 NOT DETECTED NOT DETECTED Final   Parainfluenza Virus 2 NOT DETECTED NOT DETECTED Final   Parainfluenza Virus 3 NOT DETECTED NOT DETECTED Final   Parainfluenza Virus 4 NOT DETECTED NOT DETECTED Final   Respiratory Syncytial Virus NOT DETECTED NOT DETECTED Final   Bordetella pertussis NOT DETECTED NOT DETECTED Final   Bordetella Parapertussis NOT DETECTED NOT DETECTED Final   Chlamydophila pneumoniae NOT DETECTED NOT DETECTED Final   Mycoplasma pneumoniae NOT DETECTED NOT DETECTED Final    Comment: Performed at [facility], [address], [place], [place] [address]  Resp panel by RT-PCR (RSV, Flu A&B, Covid) Anterior Nasal Swab     Status: None   Collection Time: 08/20/23  5:03 AM   Specimen: Anterior Nasal Swab  Result Value Ref Range Status   SARS Coronavirus 2 by RT PCR NEGATIVE NEGATIVE Final   Influenza A by PCR NEGATIVE NEGATIVE Final    Influenza B by PCR NEGATIVE NEGATIVE Final    Comment: (NOTE) The Xpert Xpress SARS-CoV-2/FLU/RSV plus assay is intended as an aid in the diagnosis of influenza from Nasopharyngeal swab specimens and should not be used as a sole basis for treatment. Nasal washings and aspirates are unacceptable for Xpert Xpress SARS-CoV-2/FLU/RSV testing.  Fact Sheet for Patients: BloggerCourse.com  Fact Sheet for Healthcare Providers: SeriousBroker.it  This test is not yet approved or cleared by the [place]  FDA and has been authorized  for detection and/or diagnosis of SARS-CoV-2 by FDA under an Emergency Use Authorization (EUA). This EUA will remain in effect (meaning this test can be used) for the duration of the COVID-19 declaration under Section 564(b)(1) of the Act, 21 U.S.C. section 360bbb-3(b)(1), unless the authorization is terminated or revoked.     Resp Syncytial Virus by PCR NEGATIVE NEGATIVE Final    Comment: (NOTE) Fact Sheet for Patients: BloggerCourse.com  Fact Sheet for Healthcare Providers: SeriousBroker.it  This test is not yet approved or cleared by the [place]  FDA and has been authorized for detection and/or diagnosis of SARS-CoV-2 by FDA under an Emergency Use Authorization (EUA). This EUA will remain in effect (meaning this test can be used) for the duration of the COVID-19 declaration under Section 564(b)(1) of the Act, 21 U.S.C. section 360bbb-3(b)(1), unless the authorization is terminated or revoked.  Performed at [facility], [address] [place], [place] [address]   MRSA Next Gen by PCR, Nasal     Status: None   Collection Time: 08/20/23  5:40 AM   Specimen: Nasal Mucosa; Nasal Swab  Result Value Ref Range Status   MRSA by PCR Next Gen NOT DETECTED NOT DETECTED Final    Comment: (NOTE) The GeneXpert MRSA Assay (FDA approved for NASAL  specimens only), is one component of a comprehensive MRSA colonization surveillance program. It is not intended to diagnose MRSA infection nor to guide or monitor treatment for MRSA infections. Test performance is not FDA approved in patients less than 65 years old. Performed at [facility] Lab, [address] [place], [place] [address]   Blood culture (routine x 2)     Status: Abnormal   Collection Time: 08/20/23  6:20 AM   Specimen: BLOOD RIGHT HAND  Result Value Ref Range Status   Specimen Description BLOOD RIGHT HAND  Final   Special Requests   Final    BOTTLES DRAWN AEROBIC AND ANAEROBIC Blood Culture results may not be optimal due to an inadequate volume of blood received in culture bottles   Culture  Setup Time   Final    GRAM POSITIVE COCCI IN CHAINS ANAEROBIC BOTTLE ONLY CRITICAL RESULT CALLED TO, READ BACK BY AND VERIFIED WITH: PHARMD [facility] [id number] @ 2300 FH Performed at [facility], [address], [place], [place] [address]    Culture ENTEROCOCCUS FAECALIS (A)  Final   Report Status 08/22/2023 FINAL  Final   Organism ID, Bacteria ENTEROCOCCUS FAECALIS  Final      Susceptibility   Enterococcus faecalis - MIC*    AMPICILLIN  <=2 SENSITIVE Sensitive     VANCOMYCIN  1 SENSITIVE Sensitive     GENTAMICIN SYNERGY SENSITIVE Sensitive     * ENTEROCOCCUS FAECALIS  Blood Culture ID Panel (Reflexed)     Status: Abnormal   Collection Time: 08/20/23  6:20 AM  Result Value Ref Range Status   Enterococcus faecalis DETECTED (A) NOT DETECTED Final    Comment: CRITICAL RESULT CALLED TO, READ BACK BY AND VERIFIED WITH: PHARMD J. LARON [id number] @ 2300 FH    Enterococcus Faecium NOT DETECTED NOT DETECTED Final   Listeria monocytogenes NOT DETECTED NOT DETECTED Final   Staphylococcus species NOT DETECTED NOT DETECTED Final   Staphylococcus aureus (BCID) NOT DETECTED NOT DETECTED Final   Staphylococcus epidermidis NOT DETECTED NOT DETECTED Final   Staphylococcus lugdunensis  NOT DETECTED NOT DETECTED Final   Streptococcus species NOT DETECTED NOT DETECTED Final   Streptococcus agalactiae NOT DETECTED NOT DETECTED  Final   Streptococcus pneumoniae NOT DETECTED NOT DETECTED Final   Streptococcus pyogenes NOT DETECTED NOT DETECTED Final   A.calcoaceticus-baumannii NOT DETECTED NOT DETECTED Final   Bacteroides fragilis NOT DETECTED NOT DETECTED Final   Enterobacterales NOT DETECTED NOT DETECTED Final   Enterobacter cloacae complex NOT DETECTED NOT DETECTED Final   Escherichia coli NOT DETECTED NOT DETECTED Final   Klebsiella aerogenes NOT DETECTED NOT DETECTED Final   Klebsiella oxytoca NOT DETECTED NOT DETECTED Final   Klebsiella pneumoniae NOT DETECTED NOT DETECTED Final   Proteus species NOT DETECTED NOT DETECTED Final   Salmonella species NOT DETECTED NOT DETECTED Final   Serratia marcescens NOT DETECTED NOT DETECTED Final   Haemophilus influenzae NOT DETECTED NOT DETECTED Final   Neisseria meningitidis NOT DETECTED NOT DETECTED Final   Pseudomonas aeruginosa NOT DETECTED NOT DETECTED Final   Stenotrophomonas maltophilia NOT DETECTED NOT DETECTED Final   Candida albicans NOT DETECTED NOT DETECTED Final   Candida auris NOT DETECTED NOT DETECTED Final   Candida glabrata NOT DETECTED NOT DETECTED Final   Candida krusei NOT DETECTED NOT DETECTED Final   Candida parapsilosis NOT DETECTED NOT DETECTED Final   Candida tropicalis NOT DETECTED NOT DETECTED Final   Cryptococcus neoformans/gattii NOT DETECTED NOT DETECTED Final   Vancomycin  resistance NOT DETECTED NOT DETECTED Final    Comment: Performed at [facility], [address] [place], [place] [address]  Blood culture (routine x 2)     Status: Abnormal (Preliminary result)   Collection Time: 08/20/23  6:23 AM   Specimen: BLOOD RIGHT HAND  Result Value Ref Range Status   Specimen Description BLOOD RIGHT HAND  Final   Special Requests AEROBIC BOTTLE ONLY Blood Culture adequate volume  Final   Culture   Setup Time   Final    GRAM POSITIVE COCCI IN CHAINS AEROBIC BOTTLE ONLY CRITICAL VALUE NOTED.  VALUE IS CONSISTENT WITH PREVIOUSLY REPORTED AND CALLED VALUE.    Culture (A)  Final    ENTEROCOCCUS FAECALIS SUSCEPTIBILITIES TO FOLLOW Performed at [facility], [address], [place], [place] [address]    Report Status PENDING  Incomplete         Radiology Studies: No results found.      Scheduled Meds:  sodium chloride    Intravenous Once   acidophilus  2 capsule Oral TID   Chlorhexidine  Gluconate Cloth  6 each Topical Daily   finasteride   5 mg Oral Daily   gabapentin   300 mg Oral q AM   gabapentin   600 mg Oral QHS   levothyroxine   100 mcg Oral Daily   pyridostigmine   60 mg Oral QPM   QUEtiapine   25 mg Oral QHS   Continuous Infusions:   LOS: 2 days      Bilan Tedesco U Leevi Cullars, DO [facility] Available via Epic secure chat 7am-7pm After these hours, please refer to coverage provider listed on [email or web address] 08/22/2023, 12:04 PM

## 2023-08-22 NOTE — Plan of Care (Signed)
  Problem: Education: Goal: Ability to describe self-care measures that may prevent or decrease complications (Diabetes Survival Skills Education) will improve Outcome: Progressing   Problem: Education: Goal: Ability to describe self-care measures that may prevent or decrease complications (Diabetes Survival Skills Education) will improve Outcome: Progressing   Problem: Coping: Goal: Ability to adjust to condition or change in health will improve Outcome: Progressing   Problem: Skin Integrity: Goal: Risk for impaired skin integrity will decrease Outcome: Progressing   Problem: Activity: Goal: Risk for activity intolerance will decrease Outcome: Progressing   Problem: Elimination: Goal: Will not experience complications related to bowel motility Outcome: Progressing   Problem: Safety: Goal: Ability to remain free from injury will improve Outcome: Progressing

## 2023-08-22 NOTE — Progress Notes (Signed)
   08/22/23 0200  What Happened  Was fall witnessed? No  Was patient injured? Yes (skin tear to right elbow)  Patient found on floor  Found by Staff-comment  Stated prior activity to/from bed, chair, or stretcher  Provider Notification  Provider Name/Title Haze Pai MD  Date Provider Notified 08/22/23  Time Provider Notified 0150  Method of Notification Page  Notification Reason Fall  Provider response No new orders  Date of Provider Response 08/21/23  Time of Provider Response 0157  Follow Up  Family notified Yes - comment  Time family notified 0200  Additional tests No  Simple treatment Dressing  Progress note created (see row info) Yes  Adult Fall Risk Assessment  Risk Factor Category (scoring not indicated) Not Applicable  Age 89  Fall History: Fall within 6 months prior to admission 5  Elimination; Bowel and/or Urine Incontinence 0  Elimination; Bowel and/or Urine Urgency/Frequency 0  Medications: includes PCA/Opiates, Anti-convulsants, Anti-hypertensives, Diuretics, Hypnotics, Laxatives, Sedatives, and Psychotropics 5  Patient Care Equipment 1  Mobility-Assistance 2  Mobility-Gait 2  Mobility-Sensory Deficit 0  Altered awareness of immediate physical environment 1  Impulsiveness 2  Lack of understanding of one's physical/cognitive limitations 4  Total Score 25  Patient Fall Risk Level High fall risk  Adult Fall Risk Interventions  Required Bundle Interventions *See Row Information* High fall risk - low, moderate, and high requirements implemented  Additional Interventions Reorient/diversional activities with confused patients;Room near nurses station;Use of appropriate toileting equipment (bedpan, BSC, etc.)  Fall intervention(s) refused/Patient educated regarding refusal Bed alarm;Nonskid socks;Open door if unsupervised;Yellow bracelet;Supervision while toileting/edge of bed sitting  Screening for Fall Injury Risk (To be completed on HIGH fall risk patients) -  Assessing Need for Floor Mats  Risk For Fall Injury- Criteria for Floor Mats 85 years or older;Confusion/dementia (+NuDESC, CIWA, TBI, etc.)  Will Implement Floor Mats Yes

## 2023-08-22 NOTE — Progress Notes (Signed)
 This chaplain is present for F/U spiritual care. Multiple family members are visiting at the Pt. bedside. The chaplain understands the Pt. has transitioned to comfort care with a joyful spirit and lots of love. The Pt. is hopeful for a restful night sleep.  This chaplain will attempt a visit with the Pt. Wife on Thursday.  Chaplain Leeroy Hummer [phone]

## 2023-08-22 NOTE — Progress Notes (Signed)
 Received a message from Unit Secretary a call was made from [facility] stating Pt's heart rate went up to 138. Went to the room to check on Pt. Pt was laying on the floor on his right side. Pt requested to be picked up off the floor. Inspected Pt once off the floor. Pt had a skin tear to the right elbow. MD notified no new orders placed. Pt's family called spoke with Pt's wife who is aware of the fall and skin tear.Informed her Pt was ok and back in bed. And she does not need to come to the hospital. Pt's wife verbalized understanding.

## 2023-08-22 NOTE — Plan of Care (Signed)
  Problem: Coping: Goal: Ability to adjust to condition or change in health will improve Outcome: Progressing   Problem: Health Behavior/Discharge Planning: Goal: Ability to identify and utilize available resources and services will improve Outcome: Progressing Goal: Ability to manage health-related needs will improve Outcome: Progressing   Problem: Metabolic: Goal: Ability to maintain appropriate glucose levels will improve Outcome: Progressing   Problem: Nutritional: Goal: Maintenance of adequate nutrition will improve Outcome: Progressing Goal: Progress toward achieving an optimal weight will improve Outcome: Progressing   Problem: Skin Integrity: Goal: Risk for impaired skin integrity will decrease Outcome: Progressing   Problem: Tissue Perfusion: Goal: Adequacy of tissue perfusion will improve Outcome: Progressing   Problem: Education: Goal: Knowledge of General Education information will improve Description: Including pain rating scale, medication(s)/side effects and non-pharmacologic comfort measures Outcome: Progressing   Problem: Health Behavior/Discharge Planning: Goal: Ability to manage health-related needs will improve Outcome: Progressing   Problem: Clinical Measurements: Goal: Ability to maintain clinical measurements within normal limits will improve Outcome: Progressing Goal: Will remain free from infection Outcome: Progressing Goal: Diagnostic test results will improve Outcome: Progressing Goal: Respiratory complications will improve Outcome: Progressing Goal: Cardiovascular complication will be avoided Outcome: Progressing   Problem: Activity: Goal: Risk for activity intolerance will decrease Outcome: Progressing   Problem: Nutrition: Goal: Adequate nutrition will be maintained Outcome: Progressing   Problem: Coping: Goal: Level of anxiety will decrease Outcome: Progressing   Problem: Elimination: Goal: Will not experience complications  related to bowel motility Outcome: Progressing Goal: Will not experience complications related to urinary retention Outcome: Progressing   Problem: Pain Managment: Goal: General experience of comfort will improve and/or be controlled Outcome: Progressing   Problem: Safety: Goal: Ability to remain free from injury will improve Outcome: Progressing   Problem: Skin Integrity: Goal: Risk for impaired skin integrity will decrease Outcome: Progressing   Problem: Pain Management: Goal: Satisfaction with pain management regimen will improve Outcome: Progressing   Problem: Role Relationship: Goal: Family's ability to cope with current situation will improve Outcome: Progressing Goal: Ability to verbalize concerns, feelings, and thoughts to partner or family member will improve Outcome: Progressing

## 2023-08-22 NOTE — Progress Notes (Signed)
 Palliative:  HPI: 89 y.o. male  with past medical history of  hypertension, hemorrhoids, hypothyroidism, peripheral neuropathy, multiple falls, ocular myasthenia gravis, arthritis, BPH, CVA 02/2023 on plavix  for stroke prevention, recent hospitalization from 6/11-6/23/25 for generalized weakness/poor p.o. intake and found to have AKI secondary to BPH with urinary retention  admitted on 08/20/2023 with bleeding fro ureteral meatus secondary from traumatic foley catheter removal of unclear cause. Currently being treated for septic shock, likely urinary in etiology. Of note, he was seen by PMT back in June for goals of care discussion.  At that time, patient and family had decided to continue with supportive care, and has changed his code status from full code to DNR/DNI. PMT has been consulted to assist with goals of care conversation. Patient/Family face treatment option decisions, advanced directive decisions and anticipatory care needs.   I met today with Mr. Santillanes along with wife, Dayla, at bedside. I spoke with Libby's daughter and son-in-law gwenda Dayla and Mr. Mollett spoke. Libby and Mr. Damiano share with me that they would like to transition to full comfort care and stop all medications not adding to comfort care. Mr. Weightman wishes to d/c oxygen as well. We did discuss that a little bit of oxygen can be provided if he becomes short of breath. We discussed these changes and the expectation that stopping antibiotics will lead to very limited time to live. Mr. mavrick mcquigg understanding and reassurance that he is ready to go. He has no fears or concerns. Dayla also confirms that she wishes full comfort care as they do not wish to prolong his suffering as he has been suffering and declining since Feb 2025. Of note, Mr. Zea fell last night and reports he thought he had to have BM and tried to get out of bed and fell. He denies any pain or injury other than tear to R elbow which he says does not hurt him.   I met  together with the rest of the family and Melissa, hospice liaison. We discussed decision for full comfort care. We discussed hopes that he will become eligible for transition to [facility] but that realistically this may not happen soon. We discussed alternative plan for home with hospice if he is not declining to be eligible for hospice facility over the next couple days. We will re-meet tomorrow morning to reassess and if no significant changes we will begin to arrange hospice services at home. Daughters will plan to stay through the weekend to help with care and give support in the transition to home. We will need to discuss more in detail with [facility] tomorrow if this is the best plan forward. Melissa was able to speak to the support from hospice at home.   All questions/concerns addressed. Emotional support provided.   Exam: Alert. Pleasant. General understanding but unsure if he understands the complexities of his condition and situation. No distress. Concern for sun-downing. Breathing regular, unlabored. Abd soft. Moves all extremities.   Plan: - DNR/DNI - Full comfort care - Reassess eligibility for [facility] vs home with hospice tomorrow  65 min  Bernarda Kitty, NP Palliative Medicine Team Pager [phone] (Please see [email or web address] for schedule) Team Phone [phone]

## 2023-08-22 NOTE — Progress Notes (Signed)
 [facility] [phone] [facility]  Hospice hospital liaison note   Attended family meeting along with Bernarda Kitty, NP PMT this morning. Patient does not currently meet IPU criteria. Will plan to reassess tomorrow. Home hospice may also be a possibility.    Patient is appropriate for hospice services in the home or LTC facility. Per discussion, we will plan to reassess for inpatient unit appropriateness tomorrow and attend family meeting again with PMT at 10am.     Thank you for the opportunity to participate in this patient's care.    Eleanor Nail, LPN Hospice hospital liaison [phone]

## 2023-08-22 NOTE — Progress Notes (Signed)
 eLink Physician-Brief Progress Note Patient Name: Steve Thompson DOB: September 30, 1931 MRN: [id number]   Date of Service  08/22/2023  HPI/Events of Note  Pt had a fall out of bed. Skin tear on Rt elbow. When RN entered room pt was on Rt side on floor. Unsure if patient hit his head, patient stated he did though.  +HSQ, Plavix    eICU Interventions  Per team, anticipated transfer to inpatient hospice, No escalation of care      Intervention Category Minor Interventions: Clinical assessment - ordering diagnostic tests  Zebedee Segundo 08/22/2023, 1:55 AM

## 2023-08-23 ENCOUNTER — Telehealth (HOSPITAL_COMMUNITY): Payer: Self-pay | Admitting: Pharmacy Technician

## 2023-08-23 ENCOUNTER — Other Ambulatory Visit (HOSPITAL_COMMUNITY): Payer: Self-pay

## 2023-08-23 DIAGNOSIS — R7881 Bacteremia: Secondary | ICD-10-CM | POA: Diagnosis not present

## 2023-08-23 DIAGNOSIS — B952 Enterococcus as the cause of diseases classified elsewhere: Secondary | ICD-10-CM | POA: Diagnosis not present

## 2023-08-23 LAB — CULTURE, BLOOD (ROUTINE X 2)
Culture: NO GROWTH — AB
Special Requests: ADEQUATE

## 2023-08-23 MED ORDER — GLYCOPYRROLATE 1 MG PO TABS
1.0000 mg | ORAL_TABLET | ORAL | 0 refills | Status: DC | PRN
  Filled 2023-08-23: qty 20, 4d supply, fill #0

## 2023-08-23 MED ORDER — LORAZEPAM 0.5 MG PO TABS: 0.5000 mg | ORAL_TABLET | Freq: Four times a day (QID) | ORAL | Status: DC | PRN

## 2023-08-23 MED ORDER — ONDANSETRON 4 MG PO TBDP
4.0000 mg | ORAL_TABLET | Freq: Four times a day (QID) | ORAL | 0 refills | Status: DC | PRN
  Filled 2023-08-23: qty 20, 7d supply, fill #0

## 2023-08-23 MED ORDER — MORPHINE SULFATE (CONCENTRATE) 10 MG /0.5 ML PO SOLN
5.0000 mg | ORAL | 0 refills | Status: DC | PRN
  Filled 2023-08-23: qty 15, 7d supply, fill #0

## 2023-08-23 MED ORDER — PHENAZOPYRIDINE HCL 100 MG PO TABS
100.0000 mg | ORAL_TABLET | Freq: Three times a day (TID) | ORAL | 0 refills | Status: DC | PRN
  Filled 2023-08-23: qty 10, 4d supply, fill #0

## 2023-08-23 MED ORDER — LORAZEPAM 0.5 MG PO TABS
0.5000 mg | ORAL_TABLET | Freq: Four times a day (QID) | ORAL | 0 refills | Status: DC | PRN
  Filled 2023-08-23: qty 30, 10d supply, fill #0

## 2023-08-23 MED ORDER — QUETIAPINE FUMARATE 25 MG PO TABS
25.0000 mg | ORAL_TABLET | Freq: Every day | ORAL | 0 refills | Status: DC
  Filled 2023-08-23: qty 30, 30d supply, fill #0

## 2023-08-23 NOTE — Discharge Summary (Addendum)
 Physician Discharge Summary  KADIEN LINEMAN FMW:[id number] DOB: 1931/05/07 DOA: 08/20/2023  PCP: Okey Carlin Redbird, MD  Admit date: 08/20/2023 Discharge date: 08/23/2023  Admitted From: Home Discharge disposition: Home   Recommendations for Outpatient Follow-Up:   Home with hospice Patient will need to go via PTR as he is unable to stand and unable to tolerate sitting for long periods-- would need 2 persons to assist if by car and family unable to provide  Discharge Diagnosis:   Principal Problem:   Enterococcal bacteremia Active Problems:   Septic shock [facility])   Palliative care by specialist   Goals of care, counseling/discussion   At risk for infection associated with Foley catheter   Shock (HCC)     Diet recommendation:  Regular.    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    Hospital Course by Problem:   Undifferentiated Shock  Lactic acidosis Enterococcus bacteremia  -Blood cultures grew enterococcus fecalis  -transition to comfort care   Hypertension Atrial flutter new onset Stress Cardiomyopathy - transition to comfort care   BPH  Urinary Retention Hematuria - Urology saw and placed his Foley, has not had any issues with that.     Hyperglycemia  Hypokalemia Hx of CVA Ocular myasthenia gravis Hyperlipidemia Peripheral neuropathy Frequent falls noted on previous admission     GOC -transition to comfort care         Discharge Exam:   Vitals:   08/22/23 1536 08/23/23 0537  BP: (!) 156/87 (!) 151/91  Pulse: 90 94  Resp: 18 18  Temp: 97.6 F (36.4 C) 97.7 F (36.5 C)  SpO2: (!) 89% 90%   Vitals:   08/22/23  0737 08/22/23 1212 08/22/23 1536 08/23/23 0537  BP: (!) 145/81 (!) 148/83 (!) 156/87 (!) 151/91  Pulse: 94 88 90 94  Resp: 18 18 18 18   Temp: 98.9 F (37.2 C) (!) 97.5 F (36.4 C) 97.6 F (36.4 C) 97.7 F (36.5 C)  TempSrc: Oral Oral Oral Oral  SpO2: 90% 92% (!) 89% 90%  Weight:      Height:        General exam: Appears calm and comfortable.   The results of significant diagnostics from this hospitalization (including imaging, microbiology, ancillary and laboratory) are listed below for reference.     Procedures and Diagnostic Studies:   ECHOCARDIOGRAM COMPLETE Result Date: 08/20/2023    ECHOCARDIOGRAM REPORT   Patient Name:   CORNEY KNIGHTON Date of Exam: 08/20/2023 Medical Rec #:  [id number]      Height:       69.5 in Accession #:    [id number]     Weight:       170.8 lb Date of Birth:  [birth year]      BSA:          1.942 m Patient Age:    89 years       BP:           75/56 mmHg Patient Gender: M              HR:           119 bpm. Exam Location:  Inpatient Procedure: 2D Echo,  Cardiac Doppler, Color Doppler and Intracardiac            Opacification Agent (Both Spectral and Color Flow Doppler were            utilized during procedure). Indications:    R94.31 Abnormal EKG; I48.92* Unspecified atrial flutter  History:        Patient has prior history of Echocardiogram examinations, most                 recent 06/28/2023. TIA and Stroke, Arrythmias:Atrial Flutter,                 Signs/Symptoms:Bacteremia and Altered Mental Status; Risk                 Factors:Dyslipidemia.  Sonographer:    Ellouise Mose RDCS Referring Phys: [id number] FONDA JAYSON SHARPS  Sonographer Comments: Technically difficult study due to poor echo windows. Machine shut down during exam, had to repeat measurements. IMPRESSIONS  1. Very poor LV function, contrast swirling in the apex, no clear thrombus but difficult to exclude clot. Left ventricular ejection fraction, by estimation, is 25 to 30%. The left ventricle has severely decreased  function. The left ventricle demonstrates regional wall motion abnormalities (see scoring diagram/findings for description). Left ventricular diastolic function could not be evaluated.  2. Right ventricular systolic function is moderately reduced. The right ventricular size is mildly enlarged. There is normal pulmonary artery systolic pressure. The estimated right ventricular systolic pressure is 28.1 mmHg.  3. Right atrial size was mildly dilated.  4. The mitral valve is grossly normal. Trivial mitral valve regurgitation.  5. The aortic valve is tricuspid. Aortic valve regurgitation is not visualized. Aortic valve sclerosis/calcification is present, without any evidence of aortic stenosis.  6. The inferior vena cava is dilated in size with <50% respiratory variability, suggesting right atrial pressure of 15 mmHg. Comparison(s): Changes from prior study are noted. Significant decrease in LV function. FINDINGS  Left Ventricle: Very poor LV function, contrast swirling in the apex, no clear thrombus but difficult to exclude clot. Left ventricular ejection fraction, by estimation, is 25 to 30%. The left ventricle has severely decreased function. The left ventricle demonstrates regional wall motion abnormalities. Definity  contrast agent was given IV to delineate the left ventricular endocardial borders. The left ventricular internal cavity size was normal in size. There is borderline left ventricular hypertrophy. Left ventricular diastolic function could not be evaluated.  LV Wall Scoring: Inferolateral and anterolateral walls hypokinetic, otherwise akinetic. Right Ventricle: The right ventricular size is mildly enlarged. No increase in right ventricular wall thickness. Right ventricular systolic function is moderately reduced. There is normal pulmonary artery systolic pressure. The tricuspid regurgitant velocity is 1.81 m/s, and with an assumed right atrial pressure of 15 mmHg, the estimated right ventricular systolic  pressure is 28.1 mmHg. Left Atrium: Left atrial size was normal in size. Right Atrium: Right atrial size was mildly dilated. Pericardium: There is no evidence of pericardial effusion. Mitral Valve: The mitral valve is grossly normal. Trivial mitral valve regurgitation. Tricuspid Valve: The tricuspid valve is normal in structure. Tricuspid valve regurgitation is mild. Aortic Valve: The aortic valve is tricuspid. Aortic valve regurgitation is not visualized. Aortic valve sclerosis/calcification is present, without any evidence of aortic stenosis. Pulmonic Valve: The pulmonic valve was not well visualized. Pulmonic valve regurgitation is not visualized. Aorta: The aortic root and ascending aorta are structurally normal, with no evidence of dilitation. Venous: The inferior vena cava is dilated in size with less than  50% respiratory variability, suggesting right atrial pressure of 15 mmHg. IAS/Shunts: The interatrial septum was not well visualized.   LV Volumes (MOD) LV vol d, MOD A2C: 72.7 ml LV vol d, MOD A4C: 94.0 ml LV vol s, MOD A2C: 58.4 ml LV vol s, MOD A4C: 49.2 ml LV SV MOD A2C:     14.3 ml LV SV MOD A4C:     94.0 ml LV SV MOD BP:      29.0 ml TRICUSPID VALVE TR Peak grad:   13.1 mmHg TR Vmax:        181.00 cm/s Morene Brownie Electronically signed by Morene Brownie Signature Date/Time: 08/20/2023/12:55:04 PM    Final    CT CHEST ABDOMEN PELVIS W CONTRAST Result Date: 08/20/2023 EXAM: CT CHEST, ABDOMEN AND PELVIS WITH CONTRAST 08/20/2023 03:09:45 AM TECHNIQUE: CT of the chest, abdomen and pelvis was performed with the administration of intravenous contrast. Multiplanar reformatted images are provided for review. Automated exposure control, iterative reconstruction, and/or weight based adjustment of the mA/kV was utilized to reduce the radiation dose to as low as reasonably achievable. COMPARISON: CT dated 06/27/2023. CLINICAL HISTORY: Sepsis; Hypotensive, bleeding from penis after Foley catheter removal.  Patient BIB EMS from home with complaint of patient pulled out Foley catheter. Patient actively bleeding from penis. Per EMS patient on Eliquis. Wife reports that patient has done this before. FINDINGS: CHEST: MEDIASTINUM: Small to moderate pericardial effusion has increased since 06/27/2023. Coronary artery and aortic atherosclerotic calcifications. THORACIC LYMPH NODES: No mediastinal, hilar or axillary lymphadenopathy. LUNGS AND PLEURA: Bilateral lower lobe scarring or atelectasis redemonstrated. Subsolid nodule in the posterior medial left lower lobe measures 2.0 x 1.9 cm, similar to slightly increased in size since 06/27/2023. Additional subsolid nodule in the right upper lobe anterior to the major fissure measures 1.1 x 1.9 cm on series 7 image 62. ABDOMEN AND PELVIS: LIVER: The liver is unremarkable. GALLBLADDER AND BILE DUCTS: Gallbladder is unremarkable. No biliary ductal dilatation. SPLEEN: No acute abnormality. PANCREAS: No acute abnormality. ADRENAL GLANDS: No acute abnormality. KIDNEYS, URETERS AND BLADDER: Prominent bilateral extrarenal pelvises. Mild bilateral hydroureter, possibly secondary to ascending urinary tract infection. Marked irregular bladder wall thickening and mucosal hyperenhancement. Numerous bladder diverticula. GI AND BOWEL: Extensive colonic diverticulosis. There is some stranding and fluid about the proximal sigmoid colon, favored related to cystitis; however, uncomplicated diverticulitis could appear similarly. No bowel obstruction. REPRODUCTIVE ORGANS: Marked enlargement of the prostate. PERITONEUM AND RETROPERITONEUM: No free air. VASCULATURE: Aorta is normal in caliber. ABDOMINAL AND PELVIS LYMPH NODES: No lymphadenopathy. BONES AND SOFT TISSUES: Chronic superior endplate compression of T12 and L2. No acute fracture in the chest, abdomen, or pelvis. IMPRESSION: 1. Extensive bladder wall thickening and mucosal Hyperenhancement with multiple bladder diverticula. This may be  related to cystitis. Underlying malignancy not excluded. 2. Mild bilateral hydroureter similar to prior. 3. Marked enlargement of the prostate. 4. Small to moderate pericardial effusion, increased since June 27, 2023. 5. Subsolid nodule in the posterior medial left lower lobe, measuring 2.0 x 1.9 cm, similar to slightly increased in size since June 27, 2023. Additional subsolid nodule in the right upper lobe anterior to the major fissure, measuring 1.1 x 1.9 cm. Findings are concerning for bronchogenic carcinoma. 6. Extensive colonic diverticulosis. Stranding and fluid about the proximal sigmoid colon is favored due to cystitis. Mild diverticulitis could appear similarly. Electronically signed by: Norman Gatlin MD 08/20/2023 03:28 AM EDT RP Workstation: [id number]   CT Head Wo Contrast Result Date: 08/20/2023 EXAM: CT HEAD WITHOUT  08/20/2023 03:10:10 AM TECHNIQUE: CT of the head was performed without the administration of intravenous contrast. Automated exposure control, iterative reconstruction, and/or weight based adjustment of the mA/kV was utilized to reduce the radiation dose to as low as reasonably achievable. COMPARISON: 06/27/2023 CLINICAL HISTORY: Mental status change, unknown cause. FINDINGS: BRAIN AND VENTRICLES: No acute intracranial hemorrhage. No mass effect or midline shift. No extra-axial fluid collection. Gray-white differentiation is maintained. No hydrocephalus. Mild volume loss and chronic ischemic white matter changes or right thalamic small vessel infarct. ORBITS: No acute abnormality. SINUSES AND MASTOIDS: No acute abnormality. SOFT TISSUES AND SKULL: No acute skull fracture. No acute soft tissue abnormality. IMPRESSION: 1. No acute intracranial abnormality. 2. Mild volume loss and chronic ischemic white matter changes. Electronically signed by: Franky Stanford MD 08/20/2023 03:23 AM EDT RP Workstation: [id number]   DG Pelvis Portable Result Date: 08/20/2023 EXAM: 1 or 2 VIEW(S) XRAY OF THE  PELVIS 08/20/2023 02:42:00 AM COMPARISON: None available. CLINICAL HISTORY: Trauma. Penile bleeding discharge. FINDINGS: BONES AND JOINTS: No acute fracture. No focal osseous lesion. No joint dislocation. SOFT TISSUES: The soft tissues are unremarkable. IMPRESSION: 1. No significant abnormality. Electronically signed by: Norman Gatlin MD 08/20/2023 02:59 AM EDT RP Workstation: [id number]   DG Chest Port 1 View Result Date: 08/20/2023 EXAM: 1 VIEW XRAY OF THE CHEST 08/20/2023 02:41:00 AM COMPARISON: 01/27/2023 CLINICAL HISTORY: Trauma. Trauma fall penile discharge FINDINGS: LUNGS AND PLEURA: Left basilar atelectasis or infiltrates. No pleural effusion. No pneumothorax. HEART AND MEDIASTINUM: Stable cardiomediastinal silhouette. Aortic atherosclerotic calcification. BONES AND SOFT TISSUES: No acute osseous abnormality. IMPRESSION: 1. Left basilar atelectasis or infiltrates. Electronically signed by: Norman Gatlin MD 08/20/2023 02:58 AM EDT RP Workstation: [id number]     Labs:   Basic Metabolic Panel: Recent Labs  Lab 08/20/23 0620 08/20/23 0914 08/20/23 1604 08/21/23 0433 08/21/23 1410  NA 141 139 135 134* 137  K 2.6* 3.2* 3.6 4.4 3.8  CL 108 107 105 105 101  CO2 20* 19* 18* 20* 26  GLUCOSE 134* 163* 216* 102* 116*  BUN 16 18 22  25* 24*  CREATININE 1.17 1.26* 1.33* 1.13 1.29*  CALCIUM  8.3* 8.0* 8.1* 8.3* 8.9  MG 1.3*  --   --  1.5* 1.7  PHOS 2.0*  --   --  3.5 3.5   GFR Estimated Creatinine Clearance: 38.7 mL/min (A) (by C-G formula based on SCr of 1.29 mg/dL (H)). Liver Function Tests: Recent Labs  Lab 08/20/23 0218 08/21/23 1410  AST 23  --   ALT 13  --   ALKPHOS 75  --   BILITOT 1.5*  --   PROT 6.1*  --   ALBUMIN 3.2* 2.8*   Recent Labs  Lab 08/20/23 0218  LIPASE 22   No results for input(s): AMMONIA in the last 168 hours. Coagulation profile Recent Labs  Lab 08/20/23 0218  INR 1.2    CBC: Recent Labs  Lab 08/20/23 0218 08/20/23 0229 08/20/23 0235  08/20/23 0620 08/21/23 0433  WBC 3.9*  --   --  19.5* 38.7*  HGB 12.5* 12.9* 12.6* 12.9* 11.4*  HCT 39.6 38.0* 37.0* 38.5* 33.9*  MCV 93.6  --   --  91.4 88.7  PLT 228  --   --  189 172   Cardiac Enzymes: No results for input(s): CKTOTAL, CKMB, CKMBINDEX, TROPONINI in the last 168 hours. BNP: Invalid input(s): POCBNP CBG: Recent Labs  Lab 08/21/23 2124 08/21/23 2348 08/22/23 0320 08/22/23 0735 08/22/23 1209  GLUCAP 86 92 87 106* 108*  D-Dimer No results for input(s): DDIMER in the last 72 hours. Hgb A1c No results for input(s): HGBA1C in the last 72 hours. Lipid Profile No results for input(s): CHOL, HDL, LDLCALC, TRIG, CHOLHDL, LDLDIRECT in the last 72 hours. Thyroid  function studies No results for input(s): TSH, T4TOTAL, T3FREE, THYROIDAB in the last 72 hours.  Invalid input(s): FREET3 Anemia work up No results for input(s): VITAMINB12, FOLATE, FERRITIN, TIBC, IRON, RETICCTPCT in the last 72 hours. Microbiology Recent Results (from the past 240 hours)  Respiratory (~20 pathogens) panel by PCR     Status: None   Collection Time: 08/20/23  5:03 AM   Specimen: Anterior Nasal Swab; Respiratory  Result Value Ref Range Status   Adenovirus NOT DETECTED NOT DETECTED Final   Coronavirus 229E NOT DETECTED NOT DETECTED Final    Comment: (NOTE) The Coronavirus on the Respiratory Panel, DOES NOT test for the novel  Coronavirus (2019 nCoV)    Coronavirus HKU1 NOT DETECTED NOT DETECTED Final   Coronavirus NL63 NOT DETECTED NOT DETECTED Final   Coronavirus OC43 NOT DETECTED NOT DETECTED Final   Metapneumovirus NOT DETECTED NOT DETECTED Final   Rhinovirus / Enterovirus NOT DETECTED NOT DETECTED Final   Influenza A NOT DETECTED NOT DETECTED Final   Influenza B NOT DETECTED NOT DETECTED Final   Parainfluenza Virus 1 NOT DETECTED NOT DETECTED Final   Parainfluenza Virus 2 NOT DETECTED NOT DETECTED Final   Parainfluenza Virus 3 NOT  DETECTED NOT DETECTED Final   Parainfluenza Virus 4 NOT DETECTED NOT DETECTED Final   Respiratory Syncytial Virus NOT DETECTED NOT DETECTED Final   Bordetella pertussis NOT DETECTED NOT DETECTED Final   Bordetella Parapertussis NOT DETECTED NOT DETECTED Final   Chlamydophila pneumoniae NOT DETECTED NOT DETECTED Final   Mycoplasma pneumoniae NOT DETECTED NOT DETECTED Final    Comment: Performed at [facility], [address] [place], [place] [address]  Resp panel by RT-PCR (RSV, Flu A&B, Covid) Anterior Nasal Swab     Status: None   Collection Time: 08/20/23  5:03 AM   Specimen: Anterior Nasal Swab  Result Value Ref Range Status   SARS Coronavirus 2 by RT PCR NEGATIVE NEGATIVE Final   Influenza A by PCR NEGATIVE NEGATIVE Final   Influenza B by PCR NEGATIVE NEGATIVE Final    Comment: (NOTE) The Xpert Xpress SARS-CoV-2/FLU/RSV plus assay is intended as an aid in the diagnosis of influenza from Nasopharyngeal swab specimens and should not be used as a sole basis for treatment. Nasal washings and aspirates are unacceptable for Xpert Xpress SARS-CoV-2/FLU/RSV testing.  Fact Sheet for Patients: BloggerCourse.com  Fact Sheet for Healthcare Providers: SeriousBroker.it  This test is not yet approved or cleared by the [place]  FDA and has been authorized for detection and/or diagnosis of SARS-CoV-2 by FDA under an Emergency Use Authorization (EUA). This EUA will remain in effect (meaning this test can be used) for the duration of the COVID-19 declaration under Section 564(b)(1) of the Act, 21 U.S.C. section 360bbb-3(b)(1), unless the authorization is terminated or revoked.     Resp Syncytial Virus by PCR NEGATIVE NEGATIVE Final    Comment: (NOTE) Fact Sheet for Patients: BloggerCourse.com  Fact Sheet for Healthcare Providers: SeriousBroker.it  This test is not yet approved  or cleared by the [place]  FDA and has been authorized for detection and/or diagnosis of SARS-CoV-2 by FDA under an Emergency Use Authorization (EUA). This EUA will remain in effect (meaning this test can be used) for the duration of the  COVID-19 declaration under Section 564(b)(1) of the Act, 21 U.S.C. section 360bbb-3(b)(1), unless the authorization is terminated or revoked.  Performed at [facility], [address], [place], [place] [address]   MRSA Next Gen by PCR, Nasal     Status: None   Collection Time: 08/20/23  5:40 AM   Specimen: Nasal Mucosa; Nasal Swab  Result Value Ref Range Status   MRSA by PCR Next Gen NOT DETECTED NOT DETECTED Final    Comment: (NOTE) The GeneXpert MRSA Assay (FDA approved for NASAL specimens only), is one component of a comprehensive MRSA colonization surveillance program. It is not intended to diagnose MRSA infection nor to guide or monitor treatment for MRSA infections. Test performance is not FDA approved in patients less than 55 years old. Performed at [facility], [address] [place], [place] [address]   Blood culture (routine x 2)     Status: Abnormal   Collection Time: 08/20/23  6:20 AM   Specimen: BLOOD RIGHT HAND  Result Value Ref Range Status   Specimen Description BLOOD RIGHT HAND  Final   Special Requests   Final    BOTTLES DRAWN AEROBIC AND ANAEROBIC Blood Culture results may not be optimal due to an inadequate volume of blood received in culture bottles   Culture  Setup Time   Final    GRAM POSITIVE COCCI IN CHAINS ANAEROBIC BOTTLE ONLY CRITICAL RESULT CALLED TO, READ BACK BY AND VERIFIED WITH: PHARMD J. [facility] [id number] @ 2300 FH Performed at [facility], [address] [place], [place] [address]    Culture ENTEROCOCCUS FAECALIS (A)  Final   Report Status 08/22/2023 FINAL  Final   Organism ID, Bacteria ENTEROCOCCUS FAECALIS  Final      Susceptibility   Enterococcus faecalis - MIC*    AMPICILLIN  <=2  SENSITIVE Sensitive     VANCOMYCIN  1 SENSITIVE Sensitive     GENTAMICIN SYNERGY SENSITIVE Sensitive     * ENTEROCOCCUS FAECALIS  Blood Culture ID Panel (Reflexed)     Status: Abnormal   Collection Time: 08/20/23  6:20 AM  Result Value Ref Range Status   Enterococcus faecalis DETECTED (A) NOT DETECTED Final    Comment: CRITICAL RESULT CALLED TO, READ BACK BY AND VERIFIED WITH: PHARMD J. LARON [id number] @ 2300 FH    Enterococcus Faecium NOT DETECTED NOT DETECTED Final   Listeria monocytogenes NOT DETECTED NOT DETECTED Final   Staphylococcus species NOT DETECTED NOT DETECTED Final   Staphylococcus aureus (BCID) NOT DETECTED NOT DETECTED Final   Staphylococcus epidermidis NOT DETECTED NOT DETECTED Final   Staphylococcus lugdunensis NOT DETECTED NOT DETECTED Final   Streptococcus species NOT DETECTED NOT DETECTED Final   Streptococcus agalactiae NOT DETECTED NOT DETECTED Final   Streptococcus pneumoniae NOT DETECTED NOT DETECTED Final   Streptococcus pyogenes NOT DETECTED NOT DETECTED Final   A.calcoaceticus-baumannii NOT DETECTED NOT DETECTED Final   Bacteroides fragilis NOT DETECTED NOT DETECTED Final   Enterobacterales NOT DETECTED NOT DETECTED Final   Enterobacter cloacae complex NOT DETECTED NOT DETECTED Final   Escherichia coli NOT DETECTED NOT DETECTED Final   Klebsiella aerogenes NOT DETECTED NOT DETECTED Final   Klebsiella oxytoca NOT DETECTED NOT DETECTED Final   Klebsiella pneumoniae NOT DETECTED NOT DETECTED Final   Proteus species NOT DETECTED NOT DETECTED Final   Salmonella species NOT DETECTED NOT DETECTED Final   Serratia marcescens NOT DETECTED NOT DETECTED Final   Haemophilus influenzae NOT DETECTED NOT DETECTED Final   Neisseria  meningitidis NOT DETECTED NOT DETECTED Final   Pseudomonas aeruginosa NOT DETECTED NOT DETECTED Final   Stenotrophomonas maltophilia NOT DETECTED NOT DETECTED Final   Candida albicans NOT DETECTED NOT DETECTED Final   Candida auris NOT  DETECTED NOT DETECTED Final   Candida glabrata NOT DETECTED NOT DETECTED Final   Candida krusei NOT DETECTED NOT DETECTED Final   Candida parapsilosis NOT DETECTED NOT DETECTED Final   Candida tropicalis NOT DETECTED NOT DETECTED Final   Cryptococcus neoformans/gattii NOT DETECTED NOT DETECTED Final   Vancomycin  resistance NOT DETECTED NOT DETECTED Final    Comment: Performed at [facility], [address], [place], [place] [address]  Blood culture (routine x 2)     Status: Abnormal   Collection Time: 08/20/23  6:23 AM   Specimen: BLOOD RIGHT HAND  Result Value Ref Range Status   Specimen Description BLOOD RIGHT HAND  Final   Special Requests AEROBIC BOTTLE ONLY Blood Culture adequate volume  Final   Culture  Setup Time   Final    GRAM POSITIVE COCCI IN CHAINS AEROBIC BOTTLE ONLY CRITICAL VALUE NOTED.  VALUE IS CONSISTENT WITH PREVIOUSLY REPORTED AND CALLED VALUE.    Culture (A)  Final    ENTEROCOCCUS FAECALIS SUSCEPTIBILITIES PERFORMED ON PREVIOUS CULTURE WITHIN THE LAST 5 DAYS. Performed at [facility], [address] [place], [place] [address]    Report Status 08/23/2023 FINAL  Final     Discharge Instructions:   Discharge Instructions     Diet general   Complete by: As directed    Increase activity slowly   Complete by: As directed       Allergies as of 08/23/2023   No Known Allergies      Medication List     STOP taking these medications    alfuzosin 10 MG 24 hr tablet Commonly known as: UROXATRAL   clopidogrel  75 MG tablet Commonly known as: PLAVIX    loratadine  10 MG tablet Commonly known as: CLARITIN    rosuvastatin  20 MG tablet Commonly known as: CRESTOR    tamsulosin  0.4 MG Caps capsule Commonly known as: FLOMAX        TAKE these medications    finasteride  5 MG tablet Commonly known as: PROSCAR  Take 5 mg by mouth daily.   gabapentin  300 MG capsule Commonly known as: NEURONTIN  Take 1 capsule (300 mg total) by mouth in the  morning AND 2 capsules (600 mg total) at bedtime. What changed: See the new instructions.   glycopyrrolate  1 MG tablet Commonly known as: ROBINUL  Take 1 tablet (1 mg total) by mouth every 4 (four) hours as needed (excessive secretions).   levothyroxine  100 MCG tablet Commonly known as: SYNTHROID  Take 1 tablet (100 mcg total) by mouth daily.   LORazepam  0.5 MG tablet Commonly known as: ATIVAN  Take 1 tablet (0.5 mg total) by mouth every 6 (six) hours as needed for anxiety.   morphine  CONCENTRATE 10 mg / 0.5 ml concentrated solution Place 0.25 mLs (5 mg total) under the tongue every 3 (three) hours as needed for severe pain (pain score 7-10) or shortness of breath.   ondansetron  4 MG disintegrating tablet Commonly known as: ZOFRAN -ODT Take 1 tablet (4 mg total) by mouth every 6 (six) hours as needed for nausea.   phenazopyridine  100 MG tablet Commonly known as: PYRIDIUM  Take 1 tablet (100 mg total) by mouth 3 (three) times daily with meals as needed (urinary burning).   pyridostigmine  60 MG tablet Commonly known as: Mestinon  0.5 to one  tab up to 3 times a day as needed. What changed:  how much to take how to take this when to take this   QUEtiapine  25 MG tablet Commonly known as: SEROQUEL  Take 1 tablet (25 mg total) by mouth at bedtime.          Time coordinating discharge: 45 min  Signed:  Harlene RAYMOND Bowl DO  [facility] 08/23/2023, 12:47 PM

## 2023-08-23 NOTE — Progress Notes (Signed)
 [facility] [phone] [facility] Liaison Note  Received request from [facility] for hospice services at home after discharge. Spoke with patient's family and wife to initiate education related to hospice philosophy, services and team approach to care. Family and wife verbalized understanding of information given. Per discussion, the plan is for discharge home today.   DME needs discussed. Patient has the following equipment in the home: cane Family requests the following equipment for delivery: hospital bed, overbed table, BSC, walker  Please send signed and completed DNR home with patient/family. Please provide prescriptions at discharge as needed to ensure ongoing symptom management.  [facility] information and contact numbers given to Morrill, patient's wife. Please call with any concerns.  Thank you for the opportunity to participate in this patient's care.   Eleanor Nail, LPN [facility] Liaison [phone]

## 2023-08-23 NOTE — Telephone Encounter (Signed)
 Pharmacy Patient Advocate Encounter   Received notification from Inpatient Request that prior authorization for Glycopyrrolate  1MG  tablets is required/requested.   Insurance verification completed.   The patient is insured through [facility] ADVANTAGE/RX ADVANCE .   Per test claim: PA required; PA submitted to above mentioned insurance via CoverMyMeds Key/confirmation #/EOC [id number] Status is pending

## 2023-08-23 NOTE — TOC Progression Note (Addendum)
 Transition of Care (TOC) - Progression Note   Secure chat received . Family plans on taking patient home today with hospice through [facility]. Melissa with [facility] has ordered DME. Wife's daughter Marval [phone] is receiving DME. Per Melissa DME will be delivered at 5 PM today .   Patient will need ambulance transport home. NCM called [facility] for ambulance auth awaiting call back with determination  [facility] called back they need documentation of patient's mobility. So they can determine if he needs ambulance or if he can go by car. NCM secure chatted team   If patient approved for ambulance transport home. NCM will leave PT[facility] paperwork on chart and place patient on will call with [facility]. Nurse will confirmed with Debbie DME delivered and then call [facility] [phone]   Connie with [facility] called back , [facility] has been approved auth number [id number] good for 90 days   Left Debbie a message to confirm address. Await call back.   Received secure chats from Melissa with [facility] and Alicia with Palliative Team, Debbie called them and is wanting PTAR arranged now, not to wait for DME and they have family support at home    Called daughter Cal [phone] confirmed address and they are ready for Ezzard to come home by PTAR . PTAR called est time of pick up 1.5 hours or less . Called Cal back , she is aware and DME just arrived.   Patient Details  Name: Steve Thompson MRN: [id number] Date of Birth: [birth year]  Transition of Care [facility]) CM/SW Contact  Manasa Spease, Powell Jansky, RN Phone Number: 08/23/2023, 12:22 PM  Clinical Narrative:       Expected Discharge Plan: Hospice Medical Facility Barriers to Discharge: Continued Medical Work up, Hospice Bed not available               Expected Discharge Plan and Services In-house Referral: Clinical Social Work   Post Acute Care Choice: Hospice Living arrangements for the past 2 months: Single Family Home                                        Social Drivers of Health (SDOH) Interventions SDOH Screenings   Food Insecurity: No Food Insecurity (08/21/2023)  Housing: Low Risk  (08/21/2023)  Transportation Needs: No Transportation Needs (08/21/2023)  Utilities: Not At Risk (08/21/2023)  Social Connections: Moderately Integrated (08/21/2023)  Tobacco Use: Medium Risk (06/27/2023)    Readmission Risk Interventions     No data to display

## 2023-08-23 NOTE — Progress Notes (Addendum)
 AVS in packet for transport   DNR and Medical necessity for transport in packet  Gave TOC meds to floor nurse

## 2023-08-23 NOTE — Progress Notes (Signed)
 Palliative:  HPI: 89 y.o. male  with past medical history of  hypertension, hemorrhoids, hypothyroidism, peripheral neuropathy, multiple falls, ocular myasthenia gravis, arthritis, BPH, CVA 02/2023 on plavix  for stroke prevention, recent hospitalization from 6/11-6/23/25 for generalized weakness/poor p.o. intake and found to have AKI secondary to BPH with urinary retention  admitted on 08/20/2023 with bleeding fro ureteral meatus secondary from traumatic foley catheter removal of unclear cause. Currently being treated for septic shock, likely urinary in etiology. Of note, he was seen by PMT back in June for goals of care discussion.  At that time, patient and family had decided to continue with supportive care, and has changed his code status from full code to DNR/DNI. PMT has been consulted to assist with goals of care conversation. Patient/Family face treatment option decisions, advanced directive decisions and anticipatory care needs.    I met again today with wife, children, and all family. Mr. Cho rested comfortably in his room while we spoke. We discussed that he still does not meet criteria for hospice facility. Children share that they know that he wishes to be at home. They are hoping that we can support him with hospice to allow him to return home. Children plan to stay through the weekend to help with transition and get him settled in at home. Wife, Dayla, is anxious about this plan knowing that she will ultimately be responsible for his care and she struggles to physically care for him. Family share that they are working to help have caregiver at night as well after they leave. After some discussion Dayla agrees with transition home with hospice support. We did explain the full benefits of hospice and how they can help make care at home much easier. Libby agrees. After discussing family would like to proceed home with hospice today if possible to give them more time to settle into routine and  anticipate care needs over the next few days while more family is here to help. All agree with plan.   All questions/concerns addressed. Emotional support provided.   Exam: Alert. Pleasant. General understanding but unsure if he understands the complexities of his condition and situation. No distress. Concern for sun-downing. Breathing regular, unlabored. Abd soft. Moves all extremities.   Plan: - DNR/DNI - Full comfort care - Home with hospice  50 min  Bernarda Kitty, NP Palliative Medicine Team Pager [phone] (Please see [email or web address] for schedule) Team Phone [phone]

## 2023-08-24 ENCOUNTER — Other Ambulatory Visit (HOSPITAL_COMMUNITY): Payer: Self-pay

## 2023-08-24 LAB — BPAM RBC
Blood Product Expiration Date: 202508112359
Blood Product Unit Number: 202508122359
Blood Product Unit Number: 202508292359
ISSUE DATE / TIME: 202508040815
ISSUE DATE / TIME: 202508040846
ISSUE DATE / TIME: 202508222359
PRODUCT CODE: 202508040224
PRODUCT CODE: 202508040224
PRODUCT CODE: 202508112359
PRODUCT CODE: 202508112359
PRODUCT CODE: 202508112359
PRODUCT CODE: 202508122359
PRODUCT CODE: 202508122359
PRODUCT CODE: 202508122359
PRODUCT CODE: 202508292359
Unit Type and Rh: 202508040224
Unit Type and Rh: 202508040224
Unit Type and Rh: 202508222359
Unit Type and Rh: 5100
Unit Type and Rh: 5100
Unit Type and Rh: 5100
Unit Type and Rh: 5100
Unit Type and Rh: 5100
Unit Type and Rh: 5100
Unit Type and Rh: 5100
Unit Type and Rh: 5100
Unit Type and Rh: 5100

## 2023-08-24 LAB — TYPE AND SCREEN
ABO/RH(D): O POS
Antibody Screen: POSITIVE
Donor AG Type: NEGATIVE
PT AG Type: NEGATIVE
Unit Number: POSITIVE
Unit division: 0
Unit division: 0
Unit division: 0
Unit division: 0
Unit division: 0
Unit division: 0
Unit division: 0
Unit division: 0

## 2023-08-24 NOTE — Telephone Encounter (Signed)
 Pharmacy Patient Advocate Encounter  Received notification from [facility] [employer]/RX [employer] that Prior Authorization for Glycopyrrolate  1MG  tablets  has been DENIED.  Full denial letter will be uploaded to the media tab. See denial reason below.   PA #/Case ID/Reference #: [id number]

## 2023-08-30 DIAGNOSIS — E559 Vitamin D deficiency, unspecified: Secondary | ICD-10-CM | POA: Insufficient documentation

## 2023-08-30 DIAGNOSIS — I700 Atherosclerosis of aorta: Secondary | ICD-10-CM | POA: Insufficient documentation

## 2023-08-30 DIAGNOSIS — R29818 Other symptoms and signs involving the nervous system: Secondary | ICD-10-CM | POA: Insufficient documentation

## 2023-08-30 DIAGNOSIS — R350 Frequency of micturition: Secondary | ICD-10-CM | POA: Insufficient documentation

## 2023-08-30 DIAGNOSIS — N400 Enlarged prostate without lower urinary tract symptoms: Secondary | ICD-10-CM | POA: Insufficient documentation

## 2023-08-30 DIAGNOSIS — G479 Sleep disorder, unspecified: Secondary | ICD-10-CM | POA: Insufficient documentation

## 2023-08-30 DIAGNOSIS — Z79899 Other long term (current) drug therapy: Secondary | ICD-10-CM | POA: Insufficient documentation

## 2023-08-30 DIAGNOSIS — M11869 Other specified crystal arthropathies, unspecified knee: Secondary | ICD-10-CM | POA: Insufficient documentation

## 2023-08-30 DIAGNOSIS — R253 Fasciculation: Secondary | ICD-10-CM | POA: Insufficient documentation

## 2023-08-30 DIAGNOSIS — R7301 Impaired fasting glucose: Secondary | ICD-10-CM | POA: Insufficient documentation

## 2023-08-30 DIAGNOSIS — R413 Other amnesia: Secondary | ICD-10-CM | POA: Insufficient documentation

## 2023-09-03 ENCOUNTER — Telehealth: Payer: Self-pay | Admitting: Adult Health

## 2023-09-03 NOTE — Telephone Encounter (Signed)
 Request to cx, pt under Hospice

## 2023-09-04 ENCOUNTER — Institutional Professional Consult (permissible substitution): Admitting: Neurology

## 2023-09-17 DEATH — deceased

## 2023-09-27 ENCOUNTER — Ambulatory Visit: Payer: PPO | Admitting: Adult Health
